# Patient Record
Sex: Male | Born: 1940 | Race: White | Hispanic: No | Smoking: Former smoker
Health system: Southern US, Community
[De-identification: ages and names within clinical notes are randomized; demographics above are authoritative.]

## PROBLEM LIST (undated history)

## (undated) DIAGNOSIS — N183 Chronic kidney disease, stage 3 unspecified: Secondary | ICD-10-CM

## (undated) DIAGNOSIS — I219 Acute myocardial infarction, unspecified: Secondary | ICD-10-CM

## (undated) DIAGNOSIS — Z9989 Dependence on other enabling machines and devices: Secondary | ICD-10-CM

## (undated) DIAGNOSIS — G4733 Obstructive sleep apnea (adult) (pediatric): Secondary | ICD-10-CM

## (undated) DIAGNOSIS — Z9981 Dependence on supplemental oxygen: Secondary | ICD-10-CM

## (undated) DIAGNOSIS — K219 Gastro-esophageal reflux disease without esophagitis: Secondary | ICD-10-CM

## (undated) DIAGNOSIS — Z86718 Personal history of other venous thrombosis and embolism: Secondary | ICD-10-CM

## (undated) DIAGNOSIS — I5032 Chronic diastolic (congestive) heart failure: Secondary | ICD-10-CM

## (undated) DIAGNOSIS — J45909 Unspecified asthma, uncomplicated: Secondary | ICD-10-CM

## (undated) DIAGNOSIS — I2510 Atherosclerotic heart disease of native coronary artery without angina pectoris: Secondary | ICD-10-CM

## (undated) DIAGNOSIS — Z87442 Personal history of urinary calculi: Secondary | ICD-10-CM

## (undated) DIAGNOSIS — F329 Major depressive disorder, single episode, unspecified: Secondary | ICD-10-CM

## (undated) DIAGNOSIS — M109 Gout, unspecified: Secondary | ICD-10-CM

## (undated) DIAGNOSIS — E7800 Pure hypercholesterolemia, unspecified: Secondary | ICD-10-CM

## (undated) DIAGNOSIS — M1990 Unspecified osteoarthritis, unspecified site: Secondary | ICD-10-CM

## (undated) DIAGNOSIS — E119 Type 2 diabetes mellitus without complications: Secondary | ICD-10-CM

## (undated) DIAGNOSIS — F419 Anxiety disorder, unspecified: Secondary | ICD-10-CM

## (undated) DIAGNOSIS — I10 Essential (primary) hypertension: Secondary | ICD-10-CM

## (undated) DIAGNOSIS — I82409 Acute embolism and thrombosis of unspecified deep veins of unspecified lower extremity: Secondary | ICD-10-CM

## (undated) DIAGNOSIS — F32A Depression, unspecified: Secondary | ICD-10-CM

## (undated) HISTORY — DX: Chronic kidney disease, stage 3 unspecified: N18.30

## (undated) HISTORY — DX: Chronic kidney disease, stage 3 (moderate): N18.3

## (undated) HISTORY — DX: Chronic diastolic (congestive) heart failure: I50.32

## (undated) HISTORY — PX: JOINT REPLACEMENT: SHX530

## (undated) HISTORY — PX: CORONARY ANGIOPLASTY WITH STENT PLACEMENT: SHX49

## (undated) HISTORY — PX: ESOPHAGOGASTRODUODENOSCOPY (EGD) WITH ESOPHAGEAL DILATION: SHX5812

## (undated) HISTORY — PX: CYSTOSCOPY: SUR368

## (undated) HISTORY — DX: Personal history of other venous thrombosis and embolism: Z86.718

---

## 2003-08-02 ENCOUNTER — Inpatient Hospital Stay (HOSPITAL_COMMUNITY): Admission: EM | Admit: 2003-08-02 | Discharge: 2003-08-06 | Payer: Self-pay

## 2010-04-03 HISTORY — PX: CYSTOSCOPY W/ STONE MANIPULATION: SHX1427

## 2015-04-04 DIAGNOSIS — I82409 Acute embolism and thrombosis of unspecified deep veins of unspecified lower extremity: Secondary | ICD-10-CM

## 2015-04-04 HISTORY — PX: TOTAL KNEE ARTHROPLASTY: SHX125

## 2015-04-04 HISTORY — DX: Acute embolism and thrombosis of unspecified deep veins of unspecified lower extremity: I82.409

## 2015-10-23 ENCOUNTER — Emergency Department (INDEPENDENT_AMBULATORY_CARE_PROVIDER_SITE_OTHER)
Admission: EM | Admit: 2015-10-23 | Discharge: 2015-10-23 | Disposition: A | Discharge status: Home / Self Care | Payer: Medicare Other | Source: Home / Self Care | Attending: Family Medicine | Admitting: Family Medicine

## 2015-10-23 ENCOUNTER — Encounter: Payer: Self-pay | Admitting: Emergency Medicine

## 2015-10-23 DIAGNOSIS — L03113 Cellulitis of right upper limb: Secondary | ICD-10-CM | POA: Diagnosis not present

## 2015-10-23 DIAGNOSIS — Z23 Encounter for immunization: Secondary | ICD-10-CM | POA: Diagnosis not present

## 2015-10-23 DIAGNOSIS — L03114 Cellulitis of left upper limb: Secondary | ICD-10-CM | POA: Diagnosis not present

## 2015-10-23 DIAGNOSIS — W57XXXA Bitten or stung by nonvenomous insect and other nonvenomous arthropods, initial encounter: Secondary | ICD-10-CM

## 2015-10-23 DIAGNOSIS — T148 Other injury of unspecified body region: Secondary | ICD-10-CM

## 2015-10-23 HISTORY — DX: Pure hypercholesterolemia, unspecified: E78.00

## 2015-10-23 HISTORY — DX: Essential (primary) hypertension: I10

## 2015-10-23 HISTORY — DX: Unspecified asthma, uncomplicated: J45.909

## 2015-10-23 MED ORDER — TETANUS-DIPHTH-ACELL PERTUSSIS 5-2.5-18.5 LF-MCG/0.5 IM SUSP
0.5000 mL | Freq: Once | INTRAMUSCULAR | Status: AC
  Administered 2015-10-23: 0.5 mL via INTRAMUSCULAR

## 2015-10-23 MED ORDER — DOXYCYCLINE HYCLATE 100 MG PO CAPS: 100.0000 mg | ORAL_CAPSULE | Freq: Two times a day (BID) | ORAL | Status: DC

## 2015-10-23 NOTE — Discharge Instructions (Signed)
Elevate hand. If symptoms become significantly worse during the night or over the weekend, proceed to the local emergency room.    Cellulitis Cellulitis is an infection of the skin and the tissue beneath it. The infected area is usually red and tender. Cellulitis occurs most often in the arms and lower legs.  CAUSES  Cellulitis is caused by bacteria that enter the skin through cracks or cuts in the skin. The most common types of bacteria that cause cellulitis are staphylococci and streptococci. SIGNS AND SYMPTOMS   Redness and warmth.  Swelling.  Tenderness or pain.  Fever. DIAGNOSIS  Your health care provider can usually determine what is wrong based on a physical exam. Blood tests may also be done. TREATMENT  Treatment usually involves taking an antibiotic medicine. HOME CARE INSTRUCTIONS   Take your antibiotic medicine as directed by your health care provider. Finish the antibiotic even if you start to feel better.  Keep the infected arm or leg elevated to reduce swelling.  Apply a warm cloth to the affected area up to 4 times per day to relieve pain.  Take medicines only as directed by your health care provider.  Keep all follow-up visits as directed by your health care provider. SEEK MEDICAL CARE IF:   You notice red streaks coming from the infected area.  Your red area gets larger or turns dark in color.  Your bone or joint underneath the infected area becomes painful after the skin has healed.  Your infection returns in the same area or another area.  You notice a swollen bump in the infected area.  You develop new symptoms.  You have a fever. SEEK IMMEDIATE MEDICAL CARE IF:   You feel very sleepy.  You develop vomiting or diarrhea.  You have a general ill feeling (malaise) with muscle aches and pains.   This information is not intended to replace advice given to you by your health care provider. Make sure you discuss any questions you have with your  health care provider.   Document Released: 12/28/2004 Document Revised: 12/09/2014 Document Reviewed: 06/05/2011 Elsevier Interactive Patient Education [employer]

## 2015-10-23 NOTE — ED Notes (Signed)
Pt c/o right hand swelling since yesterday.  Pt was pulling weeds and think he may have been bitten by some ants.  Some discomfort but no pain.

## 2015-10-26 ENCOUNTER — Telehealth: Payer: Self-pay | Admitting: *Deleted

## 2015-10-26 LAB — WOUND CULTURE
GRAM STAIN: NONE SEEN
GRAM STAIN: NONE SEEN
Gram Stain: NONE SEEN
ORGANISM ID, BACTERIA: NO GROWTH

## 2015-10-26 NOTE — Telephone Encounter (Signed)
Pt called requesting Wcx results. Results given. Pt reports that is swelling is resolving. Advised him to f/u with his PCP in [place] if he has any further concerns or fails to improve. Clemens Catholic, LPN

## 2015-11-23 NOTE — ED Provider Notes (Addendum)
[facility]    CSN: [id number] Arrival date & time: 10/23/15  1028  First Provider Contact:  First MD Initiated Contact with Patient 10/23/15 1131        History   Chief Complaint Chief Complaint  Patient presents with  . Hand Problem    HPI Joshua Dalton is a 75 y.o. male.   Patient was pulling weeds yesterday, and he later developed swelling and mild pain in his hands.  He believes that he may have been bitten by some ants.  No fevers, chills, and sweats.  He has had some slight weeping from the dorsa of his hands.   The history is provided by the patient.  Hand Pain  This is a new problem. The current episode started yesterday. The problem occurs constantly. The problem has been gradually worsening. Exacerbated by: movement of hand. Nothing relieves the symptoms. He has tried nothing for the symptoms.    Past Medical History:  Diagnosis Date  . Asthma   . Diabetes mellitus without complication (HCC)   . Heart disease   . Hypercholesteremia   . Hypertension   . Obstructive sleep apnea     There are no active problems to display for this patient.   Past Surgical History:  Procedure Laterality Date  . CARDIAC SURGERY         Home Medications    Prior to Admission medications   Medication Sig Start Date End Date Taking? Authorizing Provider  allopurinol (ZYLOPRIM) 300 MG tablet Take 300 mg by mouth daily.   Yes Historical Provider, MD  clopidogrel (PLAVIX) 75 MG tablet Take 75 mg by mouth daily.   Yes Historical Provider, MD  dabigatran (PRADAXA) 150 MG CAPS capsule Take 150 mg by mouth 2 (two) times daily.   Yes Historical Provider, MD  fenofibrate (TRICOR) 145 MG tablet Take 145 mg by mouth daily.   Yes Historical Provider, MD  fexofenadine (ALLEGRA) 180 MG tablet Take 180 mg by mouth daily.   Yes Historical Provider, MD  finasteride (PROSCAR) 5 MG tablet Take 5 mg by mouth daily.   Yes Historical Provider, MD  FLUoxetine (PROZAC) 20  MG capsule Take 20 mg by mouth daily.   Yes Historical Provider, MD  Insulin Aspart (NOVOLOG [place]) Inject into the skin.   Yes Historical Provider, MD  Insulin Glargine (LANTUS [place]) Inject into the skin.   Yes Historical Provider, MD  nitroGLYCERIN (NITROSTAT) 0.4 MG SL tablet Place 0.4 mg under the tongue every 5 (five) minutes as needed for chest pain.   Yes Historical Provider, MD  pravastatin (PRAVACHOL) 80 MG tablet Take 80 mg by mouth daily.   Yes Historical Provider, MD  tamsulosin (FLOMAX) 0.4 MG CAPS capsule Take 0.4 mg by mouth.   Yes Historical Provider, MD  doxycycline (VIBRAMYCIN) 100 MG capsule Take 1 capsule (100 mg total) by mouth 2 (two) times daily. Take with food. 10/23/15   Joshua Haw, MD    Family History No family history on file.  Social History Social History  Substance Use Topics  . Smoking status: Never Smoker  . Smokeless tobacco: Not on file  . Alcohol use No     Allergies   Review of patient's allergies indicates no known allergies.   Review of Systems Review of Systems  All other systems reviewed and are negative.    Physical Exam Triage Vital Signs ED Triage Vitals  Enc Vitals Group     BP 10/23/15 1104 126/74  Pulse Rate 10/23/15 1104 71     Resp --      Temp 10/23/15 1104 97.7 F (36.5 C)     Temp Source 10/23/15 1104 Oral     SpO2 10/23/15 1104 95 %     Weight 10/23/15 1104 255 lb 12 oz (116 kg)     Height 10/23/15 1104 5\' 6"  (1.676 m)     Head Circumference --      Peak Flow --      Pain Score 10/23/15 1222 0     Pain Loc --      Pain Edu? --      Excl. in GC? --    No data found.   Updated Vital Signs BP 126/74 (BP Location: Left Arm)   Pulse 71   Temp 97.7 F (36.5 C) (Oral)   Ht 5\' 6"  (1.676 m)   Wt 255 lb 12 oz (116 kg)   SpO2 95%   BMI 41.28 kg/m   Visual Acuity Right Eye Distance:   Left Eye Distance:   Bilateral Distance:    Right Eye Near:   Left Eye Near:    Bilateral Near:     Physical Exam    Constitutional: He appears well-developed and well-nourished. No distress.  HENT:  Head: Atraumatic.  Eyes: Pupils are equal, round, and reactive to light.  Cardiovascular: Normal rate.   Pulmonary/Chest: Effort normal.  Musculoskeletal:       Right hand: He exhibits decreased range of motion, tenderness and swelling. He exhibits no bony tenderness, normal two-point discrimination and normal capillary refill.       Left hand: He exhibits decreased range of motion, tenderness and swelling. He exhibits no bony tenderness, normal two-point discrimination and normal capillary refill.       Hands: Dorsa of both hands are slightly warm, swollen, and erythematous.  No tenderness to palpation.  Distal neurovascular function is intact.   Lymphadenopathy:    He has no cervical adenopathy.  Skin: Skin is warm and dry. No rash noted.  Nursing note and vitals reviewed.    UC Treatments / Results  Labs (all labs ordered are listed, but only abnormal results are displayed) Labs Reviewed  WOUND CULTURE   Narrative:    Performed at:  [facility]                [address], [address]                [place], [place] [address]    EKG  EKG Interpretation None       Radiology No results found.  Procedures Procedures (including critical care time)  Medications Ordered in UC Medications  Tdap (BOOSTRIX) injection 0.5 mL (0.5 mLs Intramuscular Given 10/23/15 1203)     Initial Impression / Assessment and Plan / UC Course  I have reviewed the triage vital signs and the nursing notes.  Pertinent labs & imaging results that were available during my care of the patient were reviewed by me and considered in my medical decision making (see chart for details).  Clinical Course  Wound culture pending Tdap administered Begin doxycycline for staph coverage. Followup with Family Doctor if not improved in one week.   Final Clinical Impressions(s) / UC Diagnoses   Final diagnoses:   Insect bite  Cellulitis of hand, right  Cellulitis of hand, left    New Prescriptions Discharge Medication List as of 11/01/2015 10:01 AM    START taking these medications  Details  doxycycline (VIBRAMYCIN) 100 MG capsule Take 1 capsule (100 mg total) by mouth 2 (two) times daily. Take with food., Starting 10/23/2015, Until Discontinued, Print         Joshua HawStephen A Vash Quezada, MD 11/23/15 2028    Joshua HawStephen A Duong Haydel, MD 11/23/15 2030

## 2016-08-20 ENCOUNTER — Inpatient Hospital Stay (HOSPITAL_COMMUNITY)
Admission: EM | Admit: 2016-08-20 | Discharge: 2016-08-26 | DRG: 291 | Disposition: A | Discharge status: Skilled Nursing Facility | Payer: Medicare Other | Attending: Internal Medicine | Admitting: Internal Medicine

## 2016-08-20 ENCOUNTER — Emergency Department (HOSPITAL_COMMUNITY): Payer: Medicare Other

## 2016-08-20 ENCOUNTER — Encounter: Payer: Self-pay | Admitting: Emergency Medicine

## 2016-08-20 ENCOUNTER — Emergency Department (INDEPENDENT_AMBULATORY_CARE_PROVIDER_SITE_OTHER)
Admission: EM | Admit: 2016-08-20 | Discharge: 2016-08-20 | Disposition: A | Discharge status: Other Acute Inpatient Hospital | Payer: Medicare Other | Source: Home / Self Care | Attending: Family Medicine | Admitting: Family Medicine

## 2016-08-20 ENCOUNTER — Encounter (HOSPITAL_COMMUNITY): Payer: Self-pay

## 2016-08-20 DIAGNOSIS — R0609 Other forms of dyspnea: Secondary | ICD-10-CM

## 2016-08-20 DIAGNOSIS — Z794 Long term (current) use of insulin: Secondary | ICD-10-CM | POA: Diagnosis not present

## 2016-08-20 DIAGNOSIS — Z79899 Other long term (current) drug therapy: Secondary | ICD-10-CM

## 2016-08-20 DIAGNOSIS — R0600 Dyspnea, unspecified: Secondary | ICD-10-CM | POA: Diagnosis present

## 2016-08-20 DIAGNOSIS — E669 Obesity, unspecified: Secondary | ICD-10-CM | POA: Diagnosis present

## 2016-08-20 DIAGNOSIS — R0601 Orthopnea: Secondary | ICD-10-CM

## 2016-08-20 DIAGNOSIS — K2970 Gastritis, unspecified, without bleeding: Secondary | ICD-10-CM | POA: Diagnosis present

## 2016-08-20 DIAGNOSIS — Z87891 Personal history of nicotine dependence: Secondary | ICD-10-CM

## 2016-08-20 DIAGNOSIS — R0602 Shortness of breath: Secondary | ICD-10-CM | POA: Diagnosis not present

## 2016-08-20 DIAGNOSIS — E119 Type 2 diabetes mellitus without complications: Secondary | ICD-10-CM

## 2016-08-20 DIAGNOSIS — E7800 Pure hypercholesterolemia, unspecified: Secondary | ICD-10-CM | POA: Diagnosis present

## 2016-08-20 DIAGNOSIS — G4733 Obstructive sleep apnea (adult) (pediatric): Secondary | ICD-10-CM | POA: Diagnosis present

## 2016-08-20 DIAGNOSIS — Z7901 Long term (current) use of anticoagulants: Secondary | ICD-10-CM

## 2016-08-20 DIAGNOSIS — Z09 Encounter for follow-up examination after completed treatment for conditions other than malignant neoplasm: Secondary | ICD-10-CM

## 2016-08-20 DIAGNOSIS — E1122 Type 2 diabetes mellitus with diabetic chronic kidney disease: Secondary | ICD-10-CM | POA: Diagnosis present

## 2016-08-20 DIAGNOSIS — N183 Chronic kidney disease, stage 3 unspecified: Secondary | ICD-10-CM | POA: Diagnosis present

## 2016-08-20 DIAGNOSIS — K224 Dyskinesia of esophagus: Secondary | ICD-10-CM | POA: Diagnosis present

## 2016-08-20 DIAGNOSIS — I442 Atrioventricular block, complete: Secondary | ICD-10-CM | POA: Diagnosis present

## 2016-08-20 DIAGNOSIS — I10 Essential (primary) hypertension: Secondary | ICD-10-CM | POA: Diagnosis not present

## 2016-08-20 DIAGNOSIS — Z86718 Personal history of other venous thrombosis and embolism: Secondary | ICD-10-CM

## 2016-08-20 DIAGNOSIS — J45909 Unspecified asthma, uncomplicated: Secondary | ICD-10-CM | POA: Diagnosis present

## 2016-08-20 DIAGNOSIS — Z6841 Body Mass Index (BMI) 40.0 and over, adult: Secondary | ICD-10-CM

## 2016-08-20 DIAGNOSIS — N189 Chronic kidney disease, unspecified: Secondary | ICD-10-CM

## 2016-08-20 DIAGNOSIS — R1310 Dysphagia, unspecified: Secondary | ICD-10-CM | POA: Diagnosis present

## 2016-08-20 DIAGNOSIS — N179 Acute kidney failure, unspecified: Secondary | ICD-10-CM | POA: Diagnosis present

## 2016-08-20 DIAGNOSIS — I4510 Unspecified right bundle-branch block: Secondary | ICD-10-CM | POA: Diagnosis present

## 2016-08-20 DIAGNOSIS — I5033 Acute on chronic diastolic (congestive) heart failure: Secondary | ICD-10-CM | POA: Diagnosis present

## 2016-08-20 DIAGNOSIS — I130 Hypertensive heart and chronic kidney disease with heart failure and stage 1 through stage 4 chronic kidney disease, or unspecified chronic kidney disease: Secondary | ICD-10-CM | POA: Diagnosis not present

## 2016-08-20 DIAGNOSIS — M79671 Pain in right foot: Secondary | ICD-10-CM

## 2016-08-20 LAB — BASIC METABOLIC PANEL
Anion gap: 11 (ref 5–15)
BUN: 36 mg/dL — AB (ref 4–21)
BUN: 36 mg/dL — AB (ref 6–20)
CHLORIDE: 103 mmol/L (ref 101–111)
CO2: 21 mmol/L — AB (ref 22–32)
Calcium: 10 mg/dL (ref 8.9–10.3)
Creatinine, Ser: 2.31 mg/dL — ABNORMAL HIGH (ref 0.61–1.24)
Creatinine: 2.3 mg/dL — AB (ref 0.6–1.3)
GFR calc Af Amer: 30 mL/min — ABNORMAL LOW (ref >60)
GFR calc non Af Amer: 26 mL/min — ABNORMAL LOW (ref >60)
GLUCOSE: 298 mg/dL — AB (ref 65–99)
Glucose: 298 mg/dL
POTASSIUM: 4.4 mmol/L (ref 3.5–5.1)
Potassium: 4.4 mmol/L (ref 3.4–5.3)
SODIUM: 135 mmol/L — AB (ref 137–147)
Sodium: 135 mmol/L (ref 135–145)

## 2016-08-20 LAB — CBC AND DIFFERENTIAL
HEMATOCRIT: 46 % (ref 41–53)
Hemoglobin: 15.7 g/dL (ref 13.5–17.5)
Platelets: 245 10*3/uL (ref 150–399)
WBC: 11.6 10^3/mL

## 2016-08-20 LAB — I-STAT TROPONIN, ED: Troponin i, poc: 0.01 ng/mL (ref 0.00–0.08)

## 2016-08-20 LAB — CBC
HEMATOCRIT: 46.4 % (ref 39.0–52.0)
Hemoglobin: 15.7 g/dL (ref 13.0–17.0)
MCH: 32.7 pg (ref 26.0–34.0)
MCHC: 33.8 g/dL (ref 30.0–36.0)
MCV: 96.7 fL (ref 78.0–100.0)
Platelets: 245 10*3/uL (ref 150–400)
RBC: 4.8 MIL/uL (ref 4.22–5.81)
RDW: 13.4 % (ref 11.5–15.5)
WBC: 11.6 10*3/uL — AB (ref 4.0–10.5)

## 2016-08-20 LAB — BRAIN NATRIURETIC PEPTIDE: B NATRIURETIC PEPTIDE 5: 24.4 pg/mL (ref 0.0–100.0)

## 2016-08-20 MED ORDER — LORATADINE 10 MG PO TABS
10.0000 mg | ORAL_TABLET | Freq: Every day | ORAL | Status: DC
  Administered 2016-08-21 – 2016-08-26 (×6): 10 mg via ORAL
  Filled 2016-08-20 (×6): qty 1

## 2016-08-20 MED ORDER — HEPARIN (PORCINE) IN NACL 100-0.45 UNIT/ML-% IJ SOLN
1300.0000 [IU]/h | INTRAMUSCULAR | Status: DC
  Administered 2016-08-21 (×2): 1500 [IU]/h via INTRAVENOUS
  Filled 2016-08-20 (×2): qty 250

## 2016-08-20 MED ORDER — ALLOPURINOL 300 MG PO TABS
300.0000 mg | ORAL_TABLET | Freq: Every day | ORAL | Status: DC
  Administered 2016-08-21 – 2016-08-26 (×6): 300 mg via ORAL
  Filled 2016-08-20 (×6): qty 1

## 2016-08-20 MED ORDER — SODIUM CHLORIDE 0.9% FLUSH
3.0000 mL | Freq: Two times a day (BID) | INTRAVENOUS | Status: DC
  Administered 2016-08-21 – 2016-08-25 (×10): 3 mL via INTRAVENOUS

## 2016-08-20 MED ORDER — INSULIN ASPART 100 UNIT/ML ~~LOC~~ SOLN
0.0000 [IU] | SUBCUTANEOUS | Status: DC
  Administered 2016-08-21: 2 [IU] via SUBCUTANEOUS
  Administered 2016-08-21: 3 [IU] via SUBCUTANEOUS
  Administered 2016-08-21: 5 [IU] via SUBCUTANEOUS
  Administered 2016-08-21: 15 [IU] via SUBCUTANEOUS
  Administered 2016-08-21 – 2016-08-22 (×2): 5 [IU] via SUBCUTANEOUS
  Administered 2016-08-22: 8 [IU] via SUBCUTANEOUS
  Administered 2016-08-23 (×2): 3 [IU] via SUBCUTANEOUS
  Administered 2016-08-23: 5 [IU] via SUBCUTANEOUS
  Administered 2016-08-23: 3 [IU] via SUBCUTANEOUS

## 2016-08-20 MED ORDER — FENOFIBRATE 160 MG PO TABS
160.0000 mg | ORAL_TABLET | Freq: Every day | ORAL | Status: DC
  Administered 2016-08-21 – 2016-08-26 (×6): 160 mg via ORAL
  Filled 2016-08-20 (×6): qty 1

## 2016-08-20 MED ORDER — FLUOXETINE HCL 20 MG PO CAPS
20.0000 mg | ORAL_CAPSULE | Freq: Every day | ORAL | Status: DC
  Administered 2016-08-21 – 2016-08-26 (×6): 20 mg via ORAL
  Filled 2016-08-20 (×6): qty 1

## 2016-08-20 MED ORDER — HEPARIN BOLUS VIA INFUSION
2500.0000 [IU] | Freq: Once | INTRAVENOUS | Status: AC
  Administered 2016-08-21: 2500 [IU] via INTRAVENOUS
  Filled 2016-08-20: qty 2500

## 2016-08-20 MED ORDER — OXYBUTYNIN CHLORIDE ER 10 MG PO TB24
10.0000 mg | ORAL_TABLET | Freq: Every day | ORAL | Status: DC
  Administered 2016-08-21 – 2016-08-25 (×6): 10 mg via ORAL
  Filled 2016-08-20 (×6): qty 1

## 2016-08-20 MED ORDER — PRAVASTATIN SODIUM 40 MG PO TABS
80.0000 mg | ORAL_TABLET | Freq: Every day | ORAL | Status: DC
  Administered 2016-08-21 – 2016-08-25 (×5): 80 mg via ORAL
  Filled 2016-08-20 (×5): qty 2

## 2016-08-20 MED ORDER — TAMSULOSIN HCL 0.4 MG PO CAPS
0.4000 mg | ORAL_CAPSULE | Freq: Every day | ORAL | Status: DC
  Administered 2016-08-21 – 2016-08-26 (×6): 0.4 mg via ORAL
  Filled 2016-08-20 (×6): qty 1

## 2016-08-20 MED ORDER — INSULIN GLARGINE 100 UNIT/ML ~~LOC~~ SOLN
40.0000 [IU] | Freq: Every day | SUBCUTANEOUS | Status: DC
  Administered 2016-08-21 – 2016-08-25 (×6): 40 [IU] via SUBCUTANEOUS
  Filled 2016-08-20 (×7): qty 0.4

## 2016-08-20 MED ORDER — SODIUM CHLORIDE 0.9 % IV BOLUS (SEPSIS)
1000.0000 mL | Freq: Once | INTRAVENOUS | Status: AC
  Administered 2016-08-20: 1000 mL via INTRAVENOUS

## 2016-08-20 NOTE — ED Notes (Signed)
Extra lavender tube for BNP sent to main lab.

## 2016-08-20 NOTE — ED Notes (Addendum)
The pt gets short of breath with exertion  But his room air sats  Remain in the range of 96-99% \.  When he exerts himself such as using a urinal  His sats drop into the  90% range then slowly return to normal

## 2016-08-20 NOTE — ED Notes (Signed)
Iv and meds given

## 2016-08-20 NOTE — H&P (Signed)
History and Physical    Joshua Dalton ZOX:[id number] DOB: [birth year] DOA: 08/20/2016  PCP: System, Pcp Not In  Patient coming from: Home  I have personally briefly reviewed patient's old medical records in [facility]  Chief Complaint: Dyspnea  HPI: Joshua Dalton is a 76 y.o. male with medical history significant of DM2, HTN, OSA, CKD.  Patient presents to the ED with c/o SOB.  Patient was initially evaluated at outside hospital when symptoms onset 3 weeks ago and had a negative stress test performed. Patient states his symptoms have worsened over that time. 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 Patient had recent trip to [facility] 2 weeks ago in which he fell and broke his right ankle. Patient flew back to [facility] to be with his daughter as ankle is being taken care of. Patient is originally from [place].    ED Course: Creat 2.3, unable to get CTA.  EKG shows RBBB, no prior available for comparison.  O2 sats in upper 90s on room air, lung sounds clear but patient has tachypnea despite this.  Trop and BNP neg.   Review of Systems: As per HPI otherwise 10 point review of systems negative.   Past Medical History:  Diagnosis Date  . Asthma   . Diabetes mellitus without complication (HCC)   . Heart disease   . Hypercholesteremia   . Hypertension   . Obstructive sleep apnea     Past Surgical History:  Procedure Laterality Date  . CARDIAC SURGERY       reports that he has quit smoking. He has never used smokeless tobacco. He reports that he does not drink alcohol. His drug history is not on file.  No Known Allergies  History reviewed. No pertinent family history.  Prior to Admission medications   Medication Sig Start Date End Date Taking? Authorizing Provider  allopurinol (ZYLOPRIM) 300 MG tablet Take 300 mg by mouth daily.   Yes [provider]  dabigatran (PRADAXA) 150 MG CAPS capsule Take 150 mg by mouth 2 (two) times daily.    Yes [provider]  fenofibrate (TRICOR) 145 MG tablet Take 145 mg by mouth daily.   Yes [provider]  fexofenadine (ALLEGRA) 180 MG tablet Take 180 mg by mouth daily.   Yes [provider]  FLUoxetine (PROZAC) 20 MG capsule Take 20 mg by mouth daily.   Yes [provider]  furosemide (LASIX) 40 MG tablet Take 40 mg by mouth daily.   Yes [provider]  insulin aspart (NOVOLOG) 100 UNIT/ML injection Inject 20 Units into the skin 3 (three) times daily before meals.   Yes [provider]  insulin glargine (LANTUS) 100 UNIT/ML injection Inject 70 Units into the skin at bedtime.   Yes [provider]  lisinopril (PRINIVIL,ZESTRIL) 10 MG tablet Take 10 mg by mouth daily.   Yes [provider]  nitroGLYCERIN (NITROSTAT) 0.4 MG SL tablet Place 0.4 mg under the tongue every 5 (five) minutes as needed for chest pain.   Yes [provider]  oxybutynin (DITROPAN-XL) 10 MG 24 hr tablet Take 10 mg by mouth at bedtime.   Yes [provider]  pravastatin (PRAVACHOL) 80 MG tablet Take 80 mg by mouth daily.   Yes [provider]  tamsulosin (FLOMAX) 0.4 MG CAPS capsule Take 0.4 mg by mouth daily.    Yes [provider]    Physical Exam: Vitals:   08/20/16 2045 08/20/16 2115 08/20/16 2130  08/20/16 2145  BP: 104/63 (!) 100/53 112/60 (!) 104/48  Pulse: 62 68 69 62  Resp: 20 (!) 22 20 19   Temp:      TempSrc:      SpO2: 99% 98% 96% 97%  Weight:      Height:        Constitutional: NAD, calm, comfortable Eyes: PERRL, lids and conjunctivae normal ENMT: Mucous membranes are moist. Posterior pharynx clear of any exudate or lesions.Normal dentition.  Neck: normal, supple, no masses, no thyromegaly Respiratory: clear to auscultation bilaterally, no wheezing, no crackles. Normal respiratory effort. No accessory muscle use.  Cardiovascular: Regular rate and rhythm, no murmurs / rubs / gallops. No  extremity edema. 2+ pedal pulses. No carotid bruits.  Abdomen: no tenderness, no masses palpated. No hepatosplenomegaly. Bowel sounds positive.  Musculoskeletal: no clubbing / cyanosis. No joint deformity upper and lower extremities. Good ROM, no contractures. Normal muscle tone.  Skin: no rashes, lesions, ulcers. No induration Neurologic: CN 2-12 grossly intact. Sensation intact, DTR normal. Strength 5/5 in all 4.  Psychiatric: Normal judgment and insight. Alert and oriented x 3. Normal mood.    Labs on Admission: I have personally reviewed following labs and imaging studies  CBC:  Recent Labs Lab 08/20/16 1937  WBC 11.6*  HGB 15.7  HCT 46.4  MCV 96.7  PLT 245   Basic Metabolic Panel:  Recent Labs Lab 08/20/16 1937  NA 135  K 4.4  CL 103  CO2 21*  GLUCOSE 298*  BUN 36*  CREATININE 2.31*  CALCIUM 10.0   GFR: Estimated Creatinine Clearance: 31.7 mL/min (A) (by C-G formula based on SCr of 2.31 mg/dL (H)). Liver Function Tests: No results for input(s): AST, ALT, ALKPHOS, BILITOT, PROT, ALBUMIN in the last 168 hours. No results for input(s): LIPASE, AMYLASE in the last 168 hours. No results for input(s): AMMONIA in the last 168 hours. Coagulation Profile: No results for input(s): INR, PROTIME in the last 168 hours. Cardiac Enzymes: No results for input(s): CKTOTAL, CKMB, CKMBINDEX, TROPONINI in the last 168 hours. BNP (last 3 results) No results for input(s): PROBNP in the last 8760 hours. HbA1C: No results for input(s): HGBA1C in the last 72 hours. CBG: No results for input(s): GLUCAP in the last 168 hours. Lipid Profile: No results for input(s): CHOL, HDL, LDLCALC, TRIG, CHOLHDL, LDLDIRECT in the last 72 hours. Thyroid Function Tests: No results for input(s): TSH, T4TOTAL, FREET4, T3FREE, THYROIDAB in the last 72 hours. Anemia Panel: No results for input(s): VITAMINB12, FOLATE, FERRITIN, TIBC, IRON, RETICCTPCT in the last 72 hours. Urine analysis: No results  found for: COLORURINE, APPEARANCEUR, LABSPEC, PHURINE, GLUCOSEU, HGBUR, BILIRUBINUR, KETONESUR, PROTEINUR, UROBILINOGEN, NITRITE, LEUKOCYTESUR  Radiological Exams on Admission: Dg Chest 2 View  Result Date: 08/20/2016 CLINICAL DATA:  Shortness of breath EXAM: CHEST  2 VIEW COMPARISON:  Aug 03, 2003 FINDINGS: A rounded density projects over the lateral right lung base, not the when seen previously. No other nodules or masses. No focal infiltrates. No pneumothorax. The cardiomediastinal silhouette is unremarkable. IMPRESSION: Nodular density projected over the lateral right lung base may represent a nipple shadow or pulmonary nodule based on this study. Recommend a repeat film with nipple markers to confirm. No focal infiltrate. Electronically Signed   By: Gerome Sam III M.D   On: 08/20/2016 18:40    EKG: Independently reviewed.  Assessment/Plan Principal Problem:   Dyspnea Active Problems:   DM2 (diabetes mellitus, type 2) (HCC)   HTN (hypertension)   CKD (chronic kidney disease)  1. Dyspnea - 1. VQ scan ordered 2. RBBB noted, no prior available for comparision 3. 2d echo ordered 4. Empiric heparin gtt (will hold his pradaxa). 2. CKD - unknown baseline 1. Hold lasix and lisinopril 2. Repeat BMP in AM 3. DM2 - 1. Lantus 40 units QHS 2. SSI q4h while NPO for AM VQ scan 4. H/o DVT - patient noted to be taking long term pradaxa which we will hold and put on heparin gtt instead 5. HTN - holding nephrotoxic lisinopril and lasix, continue other BP meds  DVT prophylaxis: Heparin gtt Code Status: Full Family Communication: Daughter at bedside Disposition Plan: Home after admit Consults called: None Admission status: Place in [place]   GARDNER, Heywood IlesJARED M. DO [facility] Pager [phone]  If 7AM-7PM, please contact day team taking care of patient [email or web address] Password [email or web address]  08/20/2016, 11:17 PM

## 2016-08-20 NOTE — ED Notes (Signed)
The pt is being admitted  He was not told he was going to be admitted.  He was wiondering what he was waiting on

## 2016-08-20 NOTE — ED Notes (Signed)
Ed res has cancelled  The order for a liter of fluid.  approx 150 ml infused

## 2016-08-20 NOTE — ED Provider Notes (Signed)
I have personally seen and examined the patient. I have reviewed the documentation on PMH/FH/Soc Hx. I have discussed the plan of care with the resident and patient.  I have reviewed and agree with the resident's documentation. Please see associated encounter note.   EKG Interpretation  Date/Time:  Sunday Aug 20 2016 19:10:56 EDT Ventricular Rate:  60 PR Interval:    QRS Duration: 138 QT Interval:  433 QTC Calculation: 433 R Axis:   70 Text Interpretation:  Sinus rhythm Prolonged PR interval Right bundle branch block Nonspecific T abnormalities, lateral leads No STEMI Otherwise no significant change Confirmed by [facility] MD, Peggye Poon ([id number]) on 08/20/2016 8:06:41 PM         Ki Corbo, Amadeo GarnetPedro Eduardo, MD 08/20/16 2210

## 2016-08-20 NOTE — Progress Notes (Signed)
ANTICOAGULATION CONSULT NOTE - Initial Consult  Pharmacy Consult for Heparin Indication: Suspected PE (pt on Pradaxa PTA for afib)  No Known Allergies  Patient Measurements: Height: 5\' 6"  (167.6 cm) Weight: 250 lb (113.4 kg) IBW/kg (Calculated) : 63.8 Heparin Dosing Weight: 90 kg  Vital Signs: Temp: 98.5 F (36.9 C) (05/20 1758) Temp Source: Oral (05/20 1758) BP: 104/48 (05/20 2145) Pulse Rate: 62 (05/20 2145)  Labs:  Recent Labs  08/20/16 1937  HGB 15.7  HCT 46.4  PLT 245  CREATININE 2.31*    Estimated Creatinine Clearance: 31.7 mL/min (A) (by C-G formula based on SCr of 2.31 mg/dL (H)).   Medical History: Past Medical History:  Diagnosis Date  . Asthma   . Diabetes mellitus without complication (HCC)   . Heart disease   . Hypercholesteremia   . Hypertension   . Obstructive sleep apnea     Medications:  See electronic med rec  Assessment: 76 y.o. M presents with SOB. Pt on Pradaxa PTA for afib - last dose 5/20 1100. To hold Pradaxa and begin heparin gtt for possible PE. CBC stable. Noted CrCl ~30 ml/min.  Goal of Therapy:  Heparin level 0.3-0.7 units/ml Monitor platelets by anticoagulation protocol: Yes   Plan:  Heparin IV bolus 2500 units (will give 1/2 bolus due to Pradaxa) Heparin gtt at 1500 units/hr Will f/u heparin level in 8 hours Daily heparin level and CBC  Christoper Fabianaron Macarthur Lorusso, PharmD, BCPS Clinical pharmacist, pager [phone] 08/20/2016,11:28 PM

## 2016-08-20 NOTE — ED Notes (Signed)
One unsuccessful attempt to start iv 

## 2016-08-20 NOTE — ED Notes (Signed)
The pt is c/o being sob for 3 weeks.  No chest pain but he reports that he is uncomfortable when he breathes.  He is not on home 02  sats ok at present

## 2016-08-20 NOTE — ED Provider Notes (Signed)
CSN: [id number]     Arrival date & time 08/20/16  1637 History   First MD Initiated Contact with Patient 08/20/16 1656     Chief Complaint  Patient presents with  . Shortness of Breath   (Consider location/radiation/quality/duration/timing/severity/associated sxs/prior Treatment) HPI  Isabella BowensRaymond Walter Wegener is a 76 y.o. male presenting to UC with daughter with concern for worsening SOB over the last 2 weeks.  Pt notes [place] He states he had dye in his veins and a scan that was "good" as well as a normal stress test.  [place] then [place] [facility] [place] where he fell and fractured his Right ankle.  He was placed in a temporary splint at that time, then had a short leg hard cast applied last week with [facility].  Daughter notes his Right foot appears to be turning purple or black in color.  She also notes his lips are darker than usual.  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 Hx of CHF.  He does take Pradaxa and Plavix.    Past Medical History:  Diagnosis Date  . Asthma   . Diabetes mellitus without complication (HCC)   . Heart disease   . Hypercholesteremia   . Hypertension   . Obstructive sleep apnea    Past Surgical History:  Procedure Laterality Date  . CARDIAC SURGERY     No family history on file. Social History  Substance Use Topics  . Smoking status: Former Games developermoker  . Smokeless tobacco: Never Used  . Alcohol use No    Review of Systems  Constitutional: Negative for chills and fever.  HENT: Negative for congestion, ear pain, sore throat, trouble swallowing and voice change.   Respiratory: Positive for shortness of breath. Negative for cough.   Cardiovascular: Negative for chest pain and palpitations.  Gastrointestinal: Negative for abdominal pain, diarrhea, nausea and vomiting.  Musculoskeletal: Positive for arthralgias and myalgias. Negative for back pain.       Right foot pain  Skin: Negative for  rash.    Allergies  Patient has no known allergies.  Home Medications   Prior to Admission medications   Medication Sig Start Date End Date Taking? Authorizing Provider  allopurinol (ZYLOPRIM) 300 MG tablet Take 300 mg by mouth daily.    [provider]  clopidogrel (PLAVIX) 75 MG tablet Take 75 mg by mouth daily.    [provider]  dabigatran (PRADAXA) 150 MG CAPS capsule Take 150 mg by mouth 2 (two) times daily.    [provider]  doxycycline (VIBRAMYCIN) 100 MG capsule Take 1 capsule (100 mg total) by mouth 2 (two) times daily. Take with food. 10/23/15   Lattie HawBeese, Stephen A, MD  fenofibrate (TRICOR) 145 MG tablet Take 145 mg by mouth daily.    [provider]  fexofenadine (ALLEGRA) 180 MG tablet Take 180 mg by mouth daily.    [provider]  finasteride (PROSCAR) 5 MG tablet Take 5 mg by mouth daily.    [provider]  FLUoxetine (PROZAC) 20 MG capsule Take 20 mg by mouth daily.    [provider]  Insulin Aspart (NOVOLOG [place]) Inject into the skin.    [provider]  Insulin Glargine (LANTUS [place]) Inject into the skin.    [provider]  nitroGLYCERIN (NITROSTAT) 0.4 MG SL tablet Place 0.4 mg under the tongue every 5 (five) minutes as needed for chest  pain.    [provider]  pravastatin (PRAVACHOL) 80 MG tablet Take 80 mg by mouth daily.    [provider]  tamsulosin (FLOMAX) 0.4 MG CAPS capsule Take 0.4 mg by mouth.    [provider]   Meds Ordered and Administered this Visit  Medications - No data to display  BP 98/66 (BP Location: Right Arm)   Pulse 67   Ht 5\' 6"  (1.676 m)   Wt 250 lb (113.4 kg)   SpO2 98%   BMI 40.35 kg/m  No data found.   Physical Exam  Constitutional: He is oriented to person, place, and time. He appears well-developed and well-nourished. He appears distressed.  Pt sitting in wheel chair breathing heavily through out mouth but is calm and  cooperative during exam.  HENT:  Head: Normocephalic and atraumatic.  Lips appear cyanotic  Eyes: EOM are normal.  Neck: Normal range of motion. Neck supple.  Cardiovascular: Normal rate and regular rhythm.   Pulmonary/Chest: No stridor. He is in respiratory distress ( mild to moderate ). He has decreased breath sounds in the right lower field and the left lower field. He has no wheezes. He has no rhonchi.  Pt breathing loudly through his mouth. Appears to be winded between sentences. No tripoding. No accessory muscle use.  Musculoskeletal: Normal range of motion.  Right leg in short leg cast: full ROM Right knee and toes.  Lymphadenopathy:    He has no cervical adenopathy.  Neurological: He is alert and oriented to person, place, and time.  Skin: Skin is warm and dry. Capillary refill takes less than 2 seconds. He is not diaphoretic.  Right leg in short leg cast. Toes visible. Dried skin with darkened skin visible along opening of cast where toes exit. Tender.   Psychiatric: He has a normal mood and affect. His behavior is normal.  Nursing note and vitals reviewed.   Urgent Care Course     Procedures (including critical care time)  Labs Review Labs Reviewed - No data to display  Imaging Review No results found.   MDM   1. Shortness of breath   2. Orthopnea   3. Right foot pain    Two weeks of worsening SOB. O2 Sat is 98% on RA, however, pt appears to be SOB and has decreased lung sounds bilaterally.  Pt has cast on Right lower leg  Concern for PE and/or CHF exacerbation.    Strongly recommend pt go to [facility] Emergency Department for further evaluation. Pt accompanied by his daughter who will drive him POV.  Pt stable for discharge POV.    Junius Finner, PA-C 08/20/16 1719

## 2016-08-20 NOTE — ED Provider Notes (Signed)
[facility]-EMERGENCY DEPT Provider Note   CSN: [id number] Arrival date & time: 08/20/16  1746     History   Chief Complaint Chief Complaint  Patient presents with  . Shortness of Breath  . Sore Throat    HPI Kell Ferris is a 76 y.o. male.  Patient is a 76 year old male with history of hypertension, high cholesterol, sleep apnea, heart failure, asthma who presents the ED with shortness of breath over the last several weeks. Patient was initially evaluated at outside hospital when this occurred 3 weeks ago and had a negative stress test performed. Patient states his symptoms of an worse over that time. 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 Patient had recent trip to [facility] 2 weeks ago in which he fell and broke his right ankle. Patient flew back to [facility] to be with his daughter as ankle is being taken care of. Patient is originally from [place].    The history is provided by the patient.  Shortness of Breath  This is a new problem. The average episode lasts 3 weeks. The problem occurs intermittently.The problem has been gradually worsening. Associated symptoms include sore throat and leg swelling. Pertinent negatives include no fever, no headaches, no coryza, no rhinorrhea, no swollen glands, no ear pain, no neck pain, no cough, no sputum production, no hemoptysis, no wheezing, no orthopnea, no chest pain, no syncope, no vomiting, no abdominal pain, no rash, no leg pain and no claudication. It is unknown what precipitated the problem. The treatment provided no relief. Associated medical issues include asthma and heart failure.    Past Medical History:  Diagnosis Date  . Asthma   . Diabetes mellitus without complication (HCC)   . Heart disease   . Hypercholesteremia   . Hypertension   . Obstructive sleep apnea     Patient Active Problem List   Diagnosis Date Noted  . Dyspnea 08/20/2016  . DM2 (diabetes mellitus, type 2) (HCC) 08/20/2016  . HTN  (hypertension) 08/20/2016  . CKD (chronic kidney disease) 08/20/2016    Past Surgical History:  Procedure Laterality Date  . CARDIAC SURGERY         Home Medications    Prior to Admission medications   Medication Sig Start Date End Date Taking? Authorizing Provider  allopurinol (ZYLOPRIM) 300 MG tablet Take 300 mg by mouth daily.   Yes [provider]  dabigatran (PRADAXA) 150 MG CAPS capsule Take 150 mg by mouth 2 (two) times daily.   Yes [provider]  fenofibrate (TRICOR) 145 MG tablet Take 145 mg by mouth daily.   Yes [provider]  fexofenadine (ALLEGRA) 180 MG tablet Take 180 mg by mouth daily.   Yes [provider]  FLUoxetine (PROZAC) 20 MG capsule Take 20 mg by mouth daily.   Yes [provider]  furosemide (LASIX) 40 MG tablet Take 40 mg by mouth daily.   Yes [provider]  insulin aspart (NOVOLOG) 100 UNIT/ML injection Inject 20 Units into the skin 3 (three) times daily before meals.   Yes [provider]  insulin glargine (LANTUS) 100 UNIT/ML injection Inject 70 Units into the skin at bedtime.   Yes [provider]  lisinopril (PRINIVIL,ZESTRIL) 10 MG tablet Take 10 mg by mouth daily.   Yes [provider]  nitroGLYCERIN (NITROSTAT) 0.4 MG SL tablet Place 0.4 mg under the tongue every 5 (five) minutes as needed for chest pain.   Yes [provider]  oxybutynin (DITROPAN-XL)  10 MG 24 hr tablet Take 10 mg by mouth at bedtime.   Yes [provider]  pravastatin (PRAVACHOL) 80 MG tablet Take 80 mg by mouth daily.   Yes [provider]  tamsulosin (FLOMAX) 0.4 MG CAPS capsule Take 0.4 mg by mouth daily.    Yes [provider]    Family History History reviewed. No pertinent family history.  Social History Social History  Substance Use Topics  . Smoking status: Former Games developer  . Smokeless tobacco: Never Used  . Alcohol use No     Allergies     Patient has no known allergies.   Review of Systems Review of Systems  Constitutional: Negative for chills and fever.  HENT: Positive for sore throat and trouble swallowing. Negative for ear pain and rhinorrhea.   Eyes: Negative for pain and visual disturbance.  Respiratory: Positive for shortness of breath. Negative for cough, hemoptysis, sputum production and wheezing.   Cardiovascular: Positive for leg swelling. Negative for chest pain, palpitations, orthopnea, claudication and syncope.  Gastrointestinal: Negative for abdominal pain and vomiting.  Genitourinary: Negative for dysuria and hematuria.  Musculoskeletal: Negative for arthralgias, back pain and neck pain.  Skin: Negative for color change and rash.  Neurological: Negative for seizures, syncope and headaches.  All other systems reviewed and are negative.    Physical Exam Updated Vital Signs  ED Triage Vitals  Enc Vitals Group     BP 08/20/16 1758 116/66     Pulse Rate 08/20/16 1758 70     Resp 08/20/16 1758 16     Temp 08/20/16 1758 98.5 F (36.9 C)     Temp Source 08/20/16 1758 Oral     SpO2 08/20/16 1758 98 %     Weight 08/20/16 1758 250 lb (113.4 kg)     Height 08/20/16 1758 5\' 6"  (1.676 m)     Head Circumference --      Peak Flow --      Pain Score 08/20/16 1802 0     Pain Loc --      Pain Edu? --      Excl. in GC? --     Physical Exam  Constitutional: He appears well-developed and well-nourished.  HENT:  Head: Normocephalic and atraumatic.  Mouth/Throat: No oropharyngeal exudate.  Eyes: Conjunctivae and EOM are normal. Pupils are equal, round, and reactive to light.  Neck: Normal range of motion. Neck supple.  Cardiovascular: Normal rate, regular rhythm, normal heart sounds and intact distal pulses.   Pulmonary/Chest: No respiratory distress. He has no wheezes. He has rales.  Increased WOB  Abdominal: Soft. Bowel sounds are normal. He exhibits no distension. There is no tenderness.   Musculoskeletal: He exhibits edema (trace b/l lower legs).  Neurological: He is alert.  Skin: Skin is warm and dry. Capillary refill takes less than 2 seconds.  Psychiatric: He has a normal mood and affect.  Nursing note and vitals reviewed.    ED Treatments / Results  Labs (all labs ordered are listed, but only abnormal results are displayed) Labs Reviewed  BASIC METABOLIC PANEL - Abnormal; Notable for the following:       Result Value   CO2 21 (*)    Glucose, Bld 298 (*)    BUN 36 (*)    Creatinine, Ser 2.31 (*)    GFR calc non Af Amer 26 (*)    GFR calc Af Amer 30 (*)    All other components within normal limits  CBC - Abnormal;  Notable for the following:    WBC 11.6 (*)    All other components within normal limits  BRAIN NATRIURETIC PEPTIDE  BASIC METABOLIC PANEL  CBC  I-STAT TROPOININ, ED    EKG  EKG Interpretation  Date/Time:  Sunday Aug 20 2016 19:10:56 EDT Ventricular Rate:  60 PR Interval:    QRS Duration: 138 QT Interval:  433 QTC Calculation: 433 R Axis:   70 Text Interpretation:  Sinus rhythm Prolonged PR interval Right bundle branch block Nonspecific T abnormalities, lateral leads No STEMI Otherwise no significant change Confirmed by [facility] MD, PEDRO ([id number]) on 08/20/2016 8:06:41 PM       Radiology Dg Chest 2 View  Result Date: 08/20/2016 CLINICAL DATA:  Shortness of breath EXAM: CHEST  2 VIEW COMPARISON:  Aug 03, 2003 FINDINGS: A rounded density projects over the lateral right lung base, not the when seen previously. No other nodules or masses. No focal infiltrates. No pneumothorax. The cardiomediastinal silhouette is unremarkable. IMPRESSION: Nodular density projected over the lateral right lung base may represent a nipple shadow or pulmonary nodule based on this study. Recommend a repeat film with nipple markers to confirm. No focal infiltrate. Electronically Signed   By: Gerome Sam III M.D   On: 08/20/2016 18:40    Procedures Procedures  (including critical care time)  Medications Ordered in ED Medications  allopurinol (ZYLOPRIM) tablet 300 mg (not administered)  loratadine (CLARITIN) tablet 10 mg (not administered)  fenofibrate tablet 160 mg (not administered)  FLUoxetine (PROZAC) capsule 20 mg (not administered)  tamsulosin (FLOMAX) capsule 0.4 mg (not administered)  pravastatin (PRAVACHOL) tablet 80 mg (not administered)  oxybutynin (DITROPAN-XL) 24 hr tablet 10 mg (not administered)  insulin glargine (LANTUS) injection 40 Units (not administered)  insulin aspart (novoLOG) injection 0-15 Units (not administered)  sodium chloride flush (NS) 0.9 % injection 3 mL (not administered)  sodium chloride 0.9 % bolus 1,000 mL (0 mLs Intravenous Stopped 08/20/16 2044)     Initial Impression / Assessment and Plan / ED Course  I have reviewed the triage vital signs and the nursing notes.  Pertinent labs & imaging results that were available during my care of the patient were reviewed by me and considered in my medical decision making (see chart for details).     Dayton Kenley is a 76 year old male with history of DVT on Pradaxa, high cholesterol, hypertension, diabetes, obstructive sleep apnea who presents to the ED with shortness of breath and difficulty swallowing. Patient's vitals at time of arrival to the ED are unremarkable and patient is without fever. Patient with symptoms over the last 3 weeks. Patient had cardiac stress test 3 weeks ago with own doctor in [place] that was unremarkable. Patient traveled to [place] and he broke his right ankle there. Patient flew back to [facility] to be with daughter to recover and follow-up with orthopedics here and states that his shortness of breath has significantly worsened since being home. Patient has possible heart failure. Patient states that he is also has some difficulty swallowing solids but no difficulties swallowing fluids. Patient has no signs of lip or tongue  swelling on exam. Patient has clear breath sounds bilaterally and some edema in his lower extremities. Patient has a cast to his right lower extremity. Patient had EKG that shows normal sinus rhythm with no signs of ischemia with right bundle branch block. No prior to compare to. Low concern for ACS given recent stress test. Troponin within normal limits. Concern for possible PE given  recent fracture and immobilization and long flight and hx of DVT following prior fx. Possible volume overload. Will get CBC, BMP, BNP, chest x-ray.   Patient with elevated creatinine and no prior to compare to. Patient states that he does have history of kidney injury but unaware of what his numbers are. Unable to get PE study due to elevated creatinine. Cardiology was called and state that patient needs to be admitted for VQ scan. Unable to get at this time as not an emergent need given hemodynamically stable patient. Patient otherwise with unremarkable chest x-ray with no pneumonia, no pneumothorax, no pleural effusion. Patient with otherwise unremarkable CBC, BMP with no significant anemia or electrolyte abnormalities. Patient with within normal BNP and diastolic CHF exacerbation. Patient to be admitted to hospitalist service with acute kidney injury and need for PE rule out. Likely AKI from chronic issues but also possibly from poor PO intake. No obvious reason for dysphagia and patient has no voice change, normal ROM of neck and no issues with secretions. May need EGD as outpatient. Patient transferred to medicine service in stable condition.  Final Clinical Impressions(s) / ED Diagnoses   Final diagnoses:  SOB (shortness of breath)  Acute kidney injury (HCC)  Dysphagia, unspecified type    New Prescriptions New Prescriptions   No medications on file     Virgina NorfolkCuratolo, Mackenzee Becvar, DO 08/20/16 2337

## 2016-08-20 NOTE — ED Notes (Signed)
Pt gets short of breath with little movement. Just sitting up in the bed, standing, as well as rolling over in bed to change sheets, but Pt's O2 level has stayed between 96%-98%.

## 2016-08-20 NOTE — ED Triage Notes (Signed)
SOB x  2 weeks, been traveling via airplane and new right ankle fracture, hx of asthma, sleep apnea

## 2016-08-20 NOTE — ED Notes (Signed)
The pt was attempting to void and was unable to get the urinal in place in time  And wet the bed  Bed changed

## 2016-08-20 NOTE — ED Triage Notes (Signed)
Pt complaining of SOB and difficulty swallowing. Pt states ongointg x 1 month. Pt states scheduled for endoscopy to assess for strictures, unable to attend appointment due to recent leg fracture. Pt with airway intact at triage. Pt sats 100% on RA. Pt denies any chest pain.

## 2016-08-21 ENCOUNTER — Observation Stay (HOSPITAL_COMMUNITY): Payer: Medicare Other

## 2016-08-21 ENCOUNTER — Other Ambulatory Visit (HOSPITAL_COMMUNITY): Payer: Medicare Other

## 2016-08-21 ENCOUNTER — Encounter (HOSPITAL_COMMUNITY): Payer: Self-pay

## 2016-08-21 ENCOUNTER — Ambulatory Visit (HOSPITAL_BASED_OUTPATIENT_CLINIC_OR_DEPARTMENT_OTHER): Payer: Medicare Other

## 2016-08-21 DIAGNOSIS — N183 Chronic kidney disease, stage 3 (moderate): Secondary | ICD-10-CM | POA: Diagnosis present

## 2016-08-21 DIAGNOSIS — K224 Dyskinesia of esophagus: Secondary | ICD-10-CM | POA: Diagnosis present

## 2016-08-21 DIAGNOSIS — Z794 Long term (current) use of insulin: Secondary | ICD-10-CM | POA: Diagnosis not present

## 2016-08-21 DIAGNOSIS — R55 Syncope and collapse: Secondary | ICD-10-CM | POA: Diagnosis not present

## 2016-08-21 DIAGNOSIS — I5033 Acute on chronic diastolic (congestive) heart failure: Secondary | ICD-10-CM | POA: Diagnosis present

## 2016-08-21 DIAGNOSIS — I442 Atrioventricular block, complete: Secondary | ICD-10-CM | POA: Diagnosis present

## 2016-08-21 DIAGNOSIS — I441 Atrioventricular block, second degree: Secondary | ICD-10-CM | POA: Diagnosis not present

## 2016-08-21 DIAGNOSIS — Z87891 Personal history of nicotine dependence: Secondary | ICD-10-CM | POA: Diagnosis not present

## 2016-08-21 DIAGNOSIS — E7800 Pure hypercholesterolemia, unspecified: Secondary | ICD-10-CM | POA: Diagnosis present

## 2016-08-21 DIAGNOSIS — Z6841 Body Mass Index (BMI) 40.0 and over, adult: Secondary | ICD-10-CM | POA: Diagnosis not present

## 2016-08-21 DIAGNOSIS — N179 Acute kidney failure, unspecified: Secondary | ICD-10-CM | POA: Diagnosis present

## 2016-08-21 DIAGNOSIS — Z09 Encounter for follow-up examination after completed treatment for conditions other than malignant neoplasm: Secondary | ICD-10-CM | POA: Diagnosis not present

## 2016-08-21 DIAGNOSIS — Z86718 Personal history of other venous thrombosis and embolism: Secondary | ICD-10-CM | POA: Diagnosis not present

## 2016-08-21 DIAGNOSIS — E1122 Type 2 diabetes mellitus with diabetic chronic kidney disease: Secondary | ICD-10-CM | POA: Diagnosis present

## 2016-08-21 DIAGNOSIS — I130 Hypertensive heart and chronic kidney disease with heart failure and stage 1 through stage 4 chronic kidney disease, or unspecified chronic kidney disease: Secondary | ICD-10-CM | POA: Diagnosis present

## 2016-08-21 DIAGNOSIS — G4733 Obstructive sleep apnea (adult) (pediatric): Secondary | ICD-10-CM | POA: Diagnosis present

## 2016-08-21 DIAGNOSIS — R0600 Dyspnea, unspecified: Secondary | ICD-10-CM | POA: Diagnosis present

## 2016-08-21 DIAGNOSIS — Z7901 Long term (current) use of anticoagulants: Secondary | ICD-10-CM | POA: Diagnosis not present

## 2016-08-21 DIAGNOSIS — I4510 Unspecified right bundle-branch block: Secondary | ICD-10-CM | POA: Diagnosis present

## 2016-08-21 DIAGNOSIS — N189 Chronic kidney disease, unspecified: Secondary | ICD-10-CM | POA: Diagnosis not present

## 2016-08-21 DIAGNOSIS — R1310 Dysphagia, unspecified: Secondary | ICD-10-CM | POA: Diagnosis present

## 2016-08-21 DIAGNOSIS — R0602 Shortness of breath: Secondary | ICD-10-CM | POA: Diagnosis not present

## 2016-08-21 DIAGNOSIS — K2970 Gastritis, unspecified, without bleeding: Secondary | ICD-10-CM | POA: Diagnosis present

## 2016-08-21 DIAGNOSIS — Z79899 Other long term (current) drug therapy: Secondary | ICD-10-CM | POA: Diagnosis not present

## 2016-08-21 DIAGNOSIS — E669 Obesity, unspecified: Secondary | ICD-10-CM | POA: Diagnosis present

## 2016-08-21 DIAGNOSIS — I10 Essential (primary) hypertension: Secondary | ICD-10-CM | POA: Diagnosis not present

## 2016-08-21 DIAGNOSIS — J45909 Unspecified asthma, uncomplicated: Secondary | ICD-10-CM | POA: Diagnosis present

## 2016-08-21 LAB — CBC
HEMATOCRIT: 44.5 % (ref 39.0–52.0)
Hemoglobin: 15.1 g/dL (ref 13.0–17.0)
MCH: 33.2 pg (ref 26.0–34.0)
MCHC: 33.9 g/dL (ref 30.0–36.0)
MCV: 97.8 fL (ref 78.0–100.0)
Platelets: 235 10*3/uL (ref 150–400)
RBC: 4.55 MIL/uL (ref 4.22–5.81)
RDW: 13.9 % (ref 11.5–15.5)
WBC: 10.1 10*3/uL (ref 4.0–10.5)

## 2016-08-21 LAB — GLUCOSE, CAPILLARY
GLUCOSE-CAPILLARY: 204 mg/dL — AB (ref 65–99)
Glucose-Capillary: 374 mg/dL — ABNORMAL HIGH (ref 65–99)
Glucose-Capillary: 155 mg/dL — ABNORMAL HIGH (ref 65–99)
Glucose-Capillary: 81 mg/dL (ref 65–99)
Glucose-Capillary: 132 mg/dL — ABNORMAL HIGH (ref 65–99)
Glucose-Capillary: 224 mg/dL — ABNORMAL HIGH (ref 65–99)

## 2016-08-21 LAB — TSH
TSH: 3.016 u[IU]/mL (ref 0.350–4.500)
TSH: 3.02 u[IU]/mL (ref 0.41–5.90)

## 2016-08-21 LAB — BASIC METABOLIC PANEL
Anion gap: 8 (ref 5–15)
BUN: 37 mg/dL — AB (ref 4–21)
BUN: 37 mg/dL — AB (ref 6–20)
CALCIUM: 9.4 mg/dL (ref 8.9–10.3)
CO2: 25 mmol/L (ref 22–32)
CREATININE: 2.5 mg/dL — AB (ref 0.6–1.3)
Chloride: 105 mmol/L (ref 101–111)
Creatinine, Ser: 2.47 mg/dL — ABNORMAL HIGH (ref 0.61–1.24)
GFR calc Af Amer: 27 mL/min — ABNORMAL LOW (ref >60)
GFR calc non Af Amer: 24 mL/min — ABNORMAL LOW (ref >60)
GLUCOSE: 216 mg/dL
GLUCOSE: 216 mg/dL — AB (ref 65–99)
POTASSIUM: 3.6 mmol/L (ref 3.4–5.3)
Potassium: 3.6 mmol/L (ref 3.5–5.1)
SODIUM: 138 mmol/L (ref 135–145)
Sodium: 138 mmol/L (ref 137–147)

## 2016-08-21 LAB — ECHOCARDIOGRAM COMPLETE
Height: 66.5 in
WEIGHTICAEL: 4000 [oz_av]

## 2016-08-21 LAB — HEPARIN LEVEL (UNFRACTIONATED)
HEPARIN UNFRACTIONATED: 0.65 [IU]/mL (ref 0.30–0.70)
Heparin Unfractionated: 0.39 IU/mL (ref 0.30–0.70)

## 2016-08-21 LAB — CBC AND DIFFERENTIAL
HCT: 45 % (ref 41–53)
Hemoglobin: 15.1 g/dL (ref 13.5–17.5)
PLATELETS: 235 10*3/uL (ref 150–399)
WBC: 10.1 10^3/mL

## 2016-08-21 MED ORDER — TECHNETIUM TO 99M ALBUMIN AGGREGATED
4.4000 | Freq: Once | INTRAVENOUS | Status: AC | PRN
  Administered 2016-08-21: 4.4 via INTRAVENOUS

## 2016-08-21 MED ORDER — TECHNETIUM TC 99M DIETHYLENETRIAME-PENTAACETIC ACID
31.5000 | Freq: Once | INTRAVENOUS | Status: AC | PRN
  Administered 2016-08-21: 31.5 via RESPIRATORY_TRACT

## 2016-08-21 MED ORDER — PERFLUTREN LIPID MICROSPHERE
1.0000 mL | INTRAVENOUS | Status: AC | PRN
  Administered 2016-08-21: 2 mL via INTRAVENOUS
  Filled 2016-08-21: qty 10

## 2016-08-21 NOTE — Care Management Note (Signed)
Case Management Note  Patient Details  Name: Joshua Dalton MRN: [id number] Date of Birth: 1940-08-06  Subjective/Objective:       CM following for progression and d/c planning.              Action/Plan: 08/21/2016 Met with pt and daughter, OBS status explained. Both pt and daughter anxious for test to be completed and hopeful of a d/c as soon as appropriate.  Pt has DME due to recent fx ankle. Will follow for any further d/c needs.  Perhaps HHPT would be helpful given NWB status.   Expected Discharge Date:                  Expected Discharge Plan:  [place]  In-House Referral:  NA  Discharge planning Services  CM Consult  Post Acute Care Choice:    Choice offered to:  Patient, Adult Children  DME Arranged:    DME Agency:     HH Arranged:  PT, OT HH Agency:     Status of Service:  In process, will continue to follow  If discussed at Long Length of Stay Meetings, dates discussed:    Additional Comments:  Adron Bene, RN 08/21/2016, 4:15 PM

## 2016-08-21 NOTE — Progress Notes (Signed)
PROGRESS NOTE    Joshua Dalton  WUJ:[id number] DOB: 08-12-1940 DOA: 08/20/2016 PCP: System, Pcp Not In   Outpatient Specialists:     Brief Narrative:  Joshua Dalton is a 76 y.o. male with medical history significant of DM2, HTN, OSA, CKD.  Patient presents to the ED with c/o SOB.  Patient was initially evaluated at outside hospital when symptoms onset 3 weeks ago and had a negative stress test performed. Patient states his symptoms have worsened over that time. 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 Patient had recent trip to [facility] 2 weeks ago in which he fell and broke his right ankle. Patient flew back to [facility] to be with his daughter as ankle is being taken care of. Patient is originally from [place].   ED Course: Creat 2.3, unable to get CTA.  EKG shows RBBB, no prior available for comparison.  O2 sats in upper 90s on room air, lung sounds clear but patient has tachypnea despite this.  Trop and BNP neg.   Assessment & Plan:   Principal Problem:   Dyspnea Active Problems:   DM2 (diabetes mellitus, type 2) (HCC)   HTN (hypertension)   CKD (chronic kidney disease)   Dyspnea - per patient known h/o CHF but not sure what kind VQ scan ordered Has gained 30 lbs in last few months RBBB noted, no prior available for comparision 2d echo ordered Empiric heparin gtt (will hold his pradaxa). -request records from PCP Re: Cr and echo  CKD - unknown baseline Hold lasix and lisinopril Repeat BMP in AM  DM2 - Lantus 40 units QHS SSI q4h while NPO for AM VQ scan  H/o DVT - patient noted to be taking long term pradaxa which we will hold and put on heparin gtt instead  HTN - holding nephrotoxic lisinopril and lasix, continue other BP meds  ? pulm nodule -needs repeat x ray with nipple marker  obesity Body mass index is 39.75 kg/m.  DVT prophylaxis:  Fully anticoagulated   Code Status: Full Code   Family  Communication:   Disposition Plan:     Consultants:     Subjective: Poor historian -he is not sure of his medical history  Objective: Vitals:   08/21/16 0015 08/21/16 0030 08/21/16 0116 08/21/16 0250  BP: (!) 82/38 (!) 97/48 (!) 117/48   Pulse: 88 89 86 82  Resp: 19 (!) 21 20 18   Temp:   98.4 F (36.9 C)   TempSrc:   Oral   SpO2: 95% 93% 94% 94%  Weight:      Height:   5' 6.5" (1.689 m)     Intake/Output Summary (Last 24 hours) at 08/21/16 1157 Last data filed at 08/21/16 0911  Gross per 24 hour  Intake              442 ml  Output              300 ml  Net              142 ml   Filed Weights   08/20/16 1758  Weight: 113.4 kg (250 lb)    Examination:  General exam: Appears calm and comfortable - hard of hearing Respiratory system: Clear to auscultation. Respiratory effort normal. Cardiovascular system: S1 & S2 heard, RRR. No JVD, murmurs, rubs, gallops or clicks. Min LE edema. Gastrointestinal system: Abdomen is nondistended, soft and nontender. No organomegaly or masses felt. Normal bowel sounds heard. Central nervous system:  Alert and oriented. No focal neurological deficits. Extremities: Symmetric 5 x 5 power. Skin: No rashes, lesions or ulcers Psychiatry: Judgement and insight appear normal. Mood & affect appropriate.     Data Reviewed: I have personally reviewed following labs and imaging studies  CBC:  Recent Labs Lab 08/20/16 1937 08/21/16 0700  WBC 11.6* 10.1  HGB 15.7 15.1  HCT 46.4 44.5  MCV 96.7 97.8  PLT 245 235   Basic Metabolic Panel:  Recent Labs Lab 08/20/16 1937 08/21/16 0442  NA 135 138  K 4.4 3.6  CL 103 105  CO2 21* 25  GLUCOSE 298* 216*  BUN 36* 37*  CREATININE 2.31* 2.47*  CALCIUM 10.0 9.4   GFR: Estimated Creatinine Clearance: 29.9 mL/min (A) (by C-G formula based on SCr of 2.47 mg/dL (H)). Liver Function Tests: No results for input(s): AST, ALT, ALKPHOS, BILITOT, PROT, ALBUMIN in the last 168 hours. No  results for input(s): LIPASE, AMYLASE in the last 168 hours. No results for input(s): AMMONIA in the last 168 hours. Coagulation Profile: No results for input(s): INR, PROTIME in the last 168 hours. Cardiac Enzymes: No results for input(s): CKTOTAL, CKMB, CKMBINDEX, TROPONINI in the last 168 hours. BNP (last 3 results) No results for input(s): PROBNP in the last 8760 hours. HbA1C: No results for input(s): HGBA1C in the last 72 hours. CBG:  Recent Labs Lab 08/21/16 0104 08/21/16 0621 08/21/16 0807  GLUCAP 374* 155* 81   Lipid Profile: No results for input(s): CHOL, HDL, LDLCALC, TRIG, CHOLHDL, LDLDIRECT in the last 72 hours. Thyroid Function Tests:  Recent Labs  08/21/16 0832  TSH 3.016   Anemia Panel: No results for input(s): VITAMINB12, FOLATE, FERRITIN, TIBC, IRON, RETICCTPCT in the last 72 hours. Urine analysis: No results found for: COLORURINE, APPEARANCEUR, LABSPEC, PHURINE, GLUCOSEU, HGBUR, BILIRUBINUR, KETONESUR, PROTEINUR, UROBILINOGEN, NITRITE, LEUKOCYTESUR   )No results found for this or any previous visit (from the past 240 hour(s)).    Anti-infectives    None       Radiology Studies: Dg Chest 2 View  Result Date: 08/20/2016 CLINICAL DATA:  Shortness of breath EXAM: CHEST  2 VIEW COMPARISON:  Aug 03, 2003 FINDINGS: A rounded density projects over the lateral right lung base, not the when seen previously. No other nodules or masses. No focal infiltrates. No pneumothorax. The cardiomediastinal silhouette is unremarkable. IMPRESSION: Nodular density projected over the lateral right lung base may represent a nipple shadow or pulmonary nodule based on this study. Recommend a repeat film with nipple markers to confirm. No focal infiltrate. Electronically Signed   By: Gerome Samavid  Williams III M.D   On: 08/20/2016 18:40        Scheduled Meds: . allopurinol  300 mg Oral Daily  . fenofibrate  160 mg Oral Daily  . FLUoxetine  20 mg Oral Daily  . insulin aspart  0-15  Units Subcutaneous Q4H  . insulin glargine  40 Units Subcutaneous QHS  . loratadine  10 mg Oral Daily  . oxybutynin  10 mg Oral QHS  . pravastatin  80 mg Oral q1800  . sodium chloride flush  3 mL Intravenous Q12H  . tamsulosin  0.4 mg Oral Daily   Continuous Infusions: . heparin 1,500 Units/hr (08/21/16 0112)     LOS: 0 days    Time spent: 25 min    Gayland Nicol U Kateleen Encarnacion, DO [facility] Pager [phone]  If 7PM-7AM, please contact night-coverage [email or web address] Password [email or web address] 08/21/2016, 11:57 AM

## 2016-08-21 NOTE — Progress Notes (Addendum)
  Echocardiogram 2D Echocardiogram with definity has been performed. Technically difficult study due to patient body habitus.  Joshua Dalton L Androw 08/21/2016, 12:45 PM

## 2016-08-21 NOTE — Progress Notes (Signed)
Per order, medical records request form faxed to:  Dr. Thomasenia Salesobert Krause, [facility] [phone] Fax [phone] [address] [place], [place]  [id number]  Request was faxed in order for [facility], at fax [phone].

## 2016-08-21 NOTE — Progress Notes (Signed)
ANTICOAGULATION CONSULT NOTE - Initial Consult  Pharmacy Consult for Heparin Indication: Suspected PE (pt on Pradaxa PTA for afib)  No Known Allergies  Patient Measurements: Height: 5' 6.5" (168.9 cm) Weight: 250 lb (113.4 kg) IBW/kg (Calculated) : 64.95 Heparin Dosing Weight: 90 kg  Vital Signs: Temp: 98.4 F (36.9 C) (05/21 0116) Temp Source: Oral (05/21 0116) BP: 117/48 (05/21 0116) Pulse Rate: 82 (05/21 0250)  Labs:  Recent Labs  08/20/16 1937 08/21/16 0442 08/21/16 0700  HGB 15.7  --  15.1  HCT 46.4  --  44.5  PLT 245  --  235  HEPARINUNFRC  --   --  0.39  CREATININE 2.31* 2.47*  --     Estimated Creatinine Clearance: 29.9 mL/min (A) (by C-G formula based on SCr of 2.47 mg/dL (H)).   Medical History: Past Medical History:  Diagnosis Date  . Asthma   . Diabetes mellitus without complication (HCC)   . Heart disease   . Hypercholesteremia   . Hypertension   . Obstructive sleep apnea    Medications:  See electronic med rec  Assessment: 76 y.o. M presents with SOB. Pt on Pradaxa PTA for afib - last dose 5/20 1100. To hold Pradaxa and begin heparin gtt for possible PE. CBC stable. Noted CrCl ~30 ml/min. No bleeding or infusion related issues documented.   Heparin level: 0.39  Goal of Therapy:  Heparin level 0.3-0.7 units/ml Monitor platelets by anticoagulation protocol: Yes   Plan:  Continue heparin gtt at 1500 units/hr Will f/u heparin level in 8 hours Daily heparin level and CBC  Ruben Imony Sanuel Ladnier, PharmD Clinical Pharmacist 08/21/2016 8:27 AM

## 2016-08-21 NOTE — Plan of Care (Signed)
Problem: Safety: Goal: Ability to remain free from injury will improve Outcome: Not Progressing Pt. Refusing bed alarm. Has history of more than one fall in the last month.

## 2016-08-21 NOTE — Progress Notes (Signed)
ANTICOAGULATION CONSULT NOTE - Initial Consult  Pharmacy Consult for Heparin Indication: Suspected PE (pt on Pradaxa PTA for afib)  No Known Allergies  Patient Measurements: Height: 5' 6.5" (168.9 cm) Weight: 250 lb (113.4 kg) IBW/kg (Calculated) : 64.95 Heparin Dosing Weight: 90 kg  Vital Signs:    Labs:  Recent Labs  08/20/16 1937 08/21/16 0442 08/21/16 0700 08/21/16 1546  HGB 15.7  --  15.1  --   HCT 46.4  --  44.5  --   PLT 245  --  235  --   HEPARINUNFRC  --   --  0.39 0.65  CREATININE 2.31* 2.47*  --   --     Estimated Creatinine Clearance: 29.9 mL/min (A) (by C-G formula based on SCr of 2.47 mg/dL (H)).   Medical History: Past Medical History:  Diagnosis Date  . Asthma   . CHF (congestive heart failure) (HCC)    patient not sure what kind  . Diabetes mellitus without complication (HCC)   . Heart disease   . Hypercholesteremia   . Hypertension   . Obstructive sleep apnea    Medications:  See electronic med rec  Assessment: 76 y.o. M presents with SOB. Pt on Pradaxa PTA for afib - last dose 5/20 1100. To hold Pradaxa and begin heparin gtt for possible PE. CBC stable. Noted CrCl ~30 ml/min. No bleeding or infusion related issues documented.   F/u heparin level 0.65 (at goal).  No bleeding or complications noted.  Goal of Therapy:  Heparin level 0.3-0.7 units/ml Monitor platelets by anticoagulation protocol: Yes   Plan:  Continue heparin gtt at 1500 units/hr Will f/u heparin level in 8 hours Daily heparin level and CBC  Tad MooreJessica Noeli Lavery, Pharm D, BCPS  Clinical Pharmacist Pager [phone]  08/21/2016 4:52 PM

## 2016-08-21 NOTE — Care Management Obs Status (Signed)
MEDICARE OBSERVATION STATUS NOTIFICATION   Patient Details  Name: Joshua Dalton MRN: [id number] Date of Birth: 01/28/1941   Medicare Observation Status Notification Given:  Yes    Joshua Dalton, Annamarie MajorCheryl U, RN 08/21/2016, 11:46 AM

## 2016-08-21 NOTE — Progress Notes (Signed)
SATURATION QUALIFICATIONS: (This note is used to comply with regulatory documentation for home oxygen)  Patient Saturations on Room Air at Rest = 99%  Patient Saturations on Room Air while Ambulating = 97%  Patient Saturations on 0 Liters of oxygen while Ambulating = 97%  Please briefly explain why patient needs home oxygen:  Pt does NOT qualify for home O2.

## 2016-08-21 NOTE — ED Notes (Signed)
Report attempted 

## 2016-08-21 NOTE — Progress Notes (Addendum)
Per order, unable to perform Home O2 desaturation qualification, as pt has broken Rigth ankle, ina cast, and is NWB.  MD notified.  1530 - Pt WAS able to ambulate with walker, so that we were able to complete Home O2 qualification.  His sats did not drop below 97% on room air; therefore, he does NOT qualify for home O2 at this time.  Pt did, however, have labored "mouth" breathing, but was not in any distress.  His HR accelerated to 135 non-sustaining. MD has been notified.

## 2016-08-21 NOTE — Progress Notes (Signed)
RT placed patient on CPAP HS. 2L O2 bleed in needed. Patient tolerating well.  ?

## 2016-08-22 ENCOUNTER — Inpatient Hospital Stay (HOSPITAL_COMMUNITY): Payer: Medicare Other

## 2016-08-22 LAB — GLUCOSE, CAPILLARY
GLUCOSE-CAPILLARY: 113 mg/dL — AB (ref 65–99)
GLUCOSE-CAPILLARY: 143 mg/dL — AB (ref 65–99)
GLUCOSE-CAPILLARY: 205 mg/dL — AB (ref 65–99)
Glucose-Capillary: 117 mg/dL — ABNORMAL HIGH (ref 65–99)
Glucose-Capillary: 288 mg/dL — ABNORMAL HIGH (ref 65–99)
Glucose-Capillary: 287 mg/dL — ABNORMAL HIGH (ref 65–99)

## 2016-08-22 LAB — CBC AND DIFFERENTIAL
HCT: 44 % (ref 41–53)
Hemoglobin: 14.6 g/dL (ref 13.5–17.5)
Platelets: 247 10*3/uL (ref 150–399)
WBC: 10.2 10^3/mL

## 2016-08-22 LAB — BASIC METABOLIC PANEL
ANION GAP: 10 (ref 5–15)
BUN: 34 mg/dL — ABNORMAL HIGH (ref 6–20)
BUN: 34 mg/dL — AB (ref 4–21)
CALCIUM: 9.2 mg/dL (ref 8.9–10.3)
CO2: 25 mmol/L (ref 22–32)
CREATININE: 2.3 mg/dL — AB (ref 0.6–1.3)
Chloride: 104 mmol/L (ref 101–111)
Creatinine, Ser: 2.25 mg/dL — ABNORMAL HIGH (ref 0.61–1.24)
GFR calc Af Amer: 31 mL/min — ABNORMAL LOW (ref >60)
GFR, EST NON AFRICAN AMERICAN: 26 mL/min — AB (ref >60)
Glucose, Bld: 99 mg/dL (ref 65–99)
Glucose: 99 mg/dL
POTASSIUM: 3.9 mmol/L (ref 3.5–5.1)
Potassium: 3.9 mmol/L (ref 3.4–5.3)
SODIUM: 139 mmol/L (ref 137–147)
SODIUM: 139 mmol/L (ref 135–145)

## 2016-08-22 LAB — CBC
HEMATOCRIT: 44.2 % (ref 39.0–52.0)
Hemoglobin: 14.6 g/dL (ref 13.0–17.0)
MCH: 32.2 pg (ref 26.0–34.0)
MCHC: 33 g/dL (ref 30.0–36.0)
MCV: 97.4 fL (ref 78.0–100.0)
PLATELETS: 247 10*3/uL (ref 150–400)
RBC: 4.54 MIL/uL (ref 4.22–5.81)
RDW: 13.9 % (ref 11.5–15.5)
WBC: 10.2 10*3/uL (ref 4.0–10.5)

## 2016-08-22 LAB — HEPARIN LEVEL (UNFRACTIONATED): Heparin Unfractionated: 0.95 IU/mL — ABNORMAL HIGH (ref 0.30–0.70)

## 2016-08-22 MED ORDER — PANTOPRAZOLE SODIUM 40 MG PO TBEC
40.0000 mg | DELAYED_RELEASE_TABLET | Freq: Two times a day (BID) | ORAL | Status: DC
  Administered 2016-08-22 – 2016-08-26 (×9): 40 mg via ORAL
  Filled 2016-08-22 (×9): qty 1

## 2016-08-22 MED ORDER — DABIGATRAN ETEXILATE MESYLATE 150 MG PO CAPS
150.0000 mg | ORAL_CAPSULE | Freq: Two times a day (BID) | ORAL | Status: DC
  Administered 2016-08-22 – 2016-08-23 (×2): 150 mg via ORAL
  Filled 2016-08-22 (×2): qty 1

## 2016-08-22 MED ORDER — FUROSEMIDE 40 MG PO TABS
40.0000 mg | ORAL_TABLET | Freq: Every day | ORAL | Status: DC
  Administered 2016-08-22 – 2016-08-25 (×4): 40 mg via ORAL
  Filled 2016-08-22 (×4): qty 1

## 2016-08-22 MED ORDER — FINASTERIDE 5 MG PO TABS
5.0000 mg | ORAL_TABLET | Freq: Every day | ORAL | Status: DC
  Administered 2016-08-22 – 2016-08-26 (×5): 5 mg via ORAL
  Filled 2016-08-22 (×5): qty 1

## 2016-08-22 NOTE — Progress Notes (Signed)
PROGRESS NOTE    Joshua Dalton  ZOX:[id number] DOB: [birth year] DOA: 08/20/2016 PCP: System, Pcp Not In   Outpatient Specialists:     Brief Narrative:  Joshua Dalton is a 76 y.o. male with medical history significant of DM2, HTN, OSA, CKD.  Patient presents to the ED with c/o SOB.  Patient was initially evaluated at outside hospital when symptoms onset 3 weeks ago and had a negative stress test performed. Patient states his symptoms have worsened over that time. 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 Patient had recent trip to [facility] 2 weeks ago in which he fell and broke his right ankle. Patient flew back to [facility] to be with his daughter as ankle is being taken care of. Patient is originally from [place].   ED Course: Creat 2.3, unable to get CTA.  EKG shows RBBB, no prior available for comparison.  O2 sats in upper 90s on room air, lung sounds clear but patient has tachypnea despite this.  Trop and BNP neg.   Assessment & Plan:   Principal Problem:   Dyspnea Active Problems:   DM2 (diabetes mellitus, type 2) (HCC)   HTN (hypertension)   CKD (chronic kidney disease)   Dyspnea - per patient known h/o CHF but not sure what kind VQ scan normal Has gained 30 lbs in last few months? BNP normal RBBB noted, no prior available for comparision 2d echo: normal EF- grade 1 diastolic dsfxn Resume prodaxa -request records from PCP - still not arrived  Dysphagia -SLP eval -esophagus dg  CKD - unknown baseline Resume lasix  DM2 - Lantus 40 units QHS SSI- change to QID once no longer NPO  H/o DVT - resume prodaxa  HTN - stable  ? pulm nodule -needs repeat x ray with nipple marker as an outpatient  obesity Body mass index is 39.75 kg/m.  DVT prophylaxis:  Fully anticoagulated   Code Status: Full Code   Family Communication: Called daughter- no answer  Disposition Plan:     Consultants:     Subjective: No further SOB  but now c/o several week h/o dysphagia  Objective: Vitals:   08/21/16 1644 08/21/16 2115 08/22/16 0024 08/22/16 0458  BP: (!) 116/95 94/60 (!) 129/58 114/64  Pulse: 71 74 71 61  Resp: 18 18 18 18   Temp: 98.2 F (36.8 C) 98.4 F (36.9 C) 98.8 F (37.1 C) 98.2 F (36.8 C)  TempSrc: Oral  Oral Oral  SpO2: 95% 95% 97% 94%  Weight:      Height:        Intake/Output Summary (Last 24 hours) at 08/22/16 1137 Last data filed at 08/22/16 0930  Gross per 24 hour  Intake           1255.5 ml  Output              875 ml  Net            380.5 ml   Filed Weights   08/20/16 1758  Weight: 113.4 kg (250 lb)    Examination:  General exam: Appears calm and comfortable - hard of hearing Respiratory system: clear and diminished Cardiovascular system: rrr Gastrointestinal system: +BS, obese. Central nervous system: Alert and oriented. No focal neurological deficits. Extremities: Symmetric 5 x 5 power. Skin: No rashes, lesions or ulcers Psychiatry: Judgement and insight appear normal. Mood & affect appropriate.     Data Reviewed: I have personally reviewed following labs and imaging studies  CBC:  Recent Labs Lab 08/20/16 1937 08/21/16 0700 08/22/16 0510  WBC 11.6* 10.1 10.2  HGB 15.7 15.1 14.6  HCT 46.4 44.5 44.2  MCV 96.7 97.8 97.4  PLT 245 235 247   Basic Metabolic Panel:  Recent Labs Lab 08/20/16 1937 08/21/16 0442 08/22/16 0510  NA 135 138 139  K 4.4 3.6 3.9  CL 103 105 104  CO2 21* 25 25  GLUCOSE 298* 216* 99  BUN 36* 37* 34*  CREATININE 2.31* 2.47* 2.25*  CALCIUM 10.0 9.4 9.2   GFR: Estimated Creatinine Clearance: 32.8 mL/min (A) (by C-G formula based on SCr of 2.25 mg/dL (H)). Liver Function Tests: No results for input(s): AST, ALT, ALKPHOS, BILITOT, PROT, ALBUMIN in the last 168 hours. No results for input(s): LIPASE, AMYLASE in the last 168 hours. No results for input(s): AMMONIA in the last 168 hours. Coagulation Profile: No results for input(s):  INR, PROTIME in the last 168 hours. Cardiac Enzymes: No results for input(s): CKTOTAL, CKMB, CKMBINDEX, TROPONINI in the last 168 hours. BNP (last 3 results) No results for input(s): PROBNP in the last 8760 hours. HbA1C: No results for input(s): HGBA1C in the last 72 hours. CBG:  Recent Labs Lab 08/21/16 1622 08/21/16 2111 08/22/16 0020 08/22/16 0454 08/22/16 0727  GLUCAP 224* 204* 143* 113* 117*   Lipid Profile: No results for input(s): CHOL, HDL, LDLCALC, TRIG, CHOLHDL, LDLDIRECT in the last 72 hours. Thyroid Function Tests:  Recent Labs  08/21/16 0832  TSH 3.016   Anemia Panel: No results for input(s): VITAMINB12, FOLATE, FERRITIN, TIBC, IRON, RETICCTPCT in the last 72 hours. Urine analysis: No results found for: COLORURINE, APPEARANCEUR, LABSPEC, PHURINE, GLUCOSEU, HGBUR, BILIRUBINUR, KETONESUR, PROTEINUR, UROBILINOGEN, NITRITE, LEUKOCYTESUR   )No results found for this or any previous visit (from the past 240 hour(s)).    Anti-infectives    None       Radiology Studies: Dg Chest 2 View  Result Date: 08/20/2016 CLINICAL DATA:  Shortness of breath EXAM: CHEST  2 VIEW COMPARISON:  Aug 03, 2003 FINDINGS: A rounded density projects over the lateral right lung base, not the when seen previously. No other nodules or masses. No focal infiltrates. No pneumothorax. The cardiomediastinal silhouette is unremarkable. IMPRESSION: Nodular density projected over the lateral right lung base may represent a nipple shadow or pulmonary nodule based on this study. Recommend a repeat film with nipple markers to confirm. No focal infiltrate. Electronically Signed   By: Gerome Samavid  Williams III M.D   On: 08/20/2016 18:40   Nm Pulmonary Perf And Vent  Result Date: 08/21/2016 CLINICAL DATA:  Shortness of breath today. EXAM: NUCLEAR MEDICINE VENTILATION - PERFUSION LUNG SCAN TECHNIQUE: Ventilation images were obtained in multiple projections using inhaled aerosol Tc-7526m DTPA. Perfusion images  were obtained in multiple projections after intravenous injection of Tc-7726m MAA. RADIOPHARMACEUTICALS:  31.5 mCi Technetium-4426m DTPA aerosol inhalation and 4.4 mCi Technetium-3326m MAA IV COMPARISON:  Chest x-ray 08/20/2016 FINDINGS: Ventilation: Normal ventilation study. Perfusion: Normal perfusion study. IMPRESSION: Negative V/Q scan for pulmonary embolism. Electronically Signed   By: Rudie MeyerP.  Gallerani M.D.   On: 08/21/2016 15:30        Scheduled Meds: . allopurinol  300 mg Oral Daily  . dabigatran  150 mg Oral BID  . fenofibrate  160 mg Oral Daily  . FLUoxetine  20 mg Oral Daily  . insulin aspart  0-15 Units Subcutaneous Q4H  . insulin glargine  40 Units Subcutaneous QHS  . loratadine  10 mg Oral Daily  . oxybutynin  10  mg Oral QHS  . pantoprazole  40 mg Oral BID  . pravastatin  80 mg Oral q1800  . sodium chloride flush  3 mL Intravenous Q12H  . tamsulosin  0.4 mg Oral Daily   Continuous Infusions:    LOS: 1 day    Time spent: 25 min    Cedrick Partain U Cylinda Santoli, DO [facility] Pager [phone]  If 7PM-7AM, please contact night-coverage [email or web address] Password [facility] 08/22/2016, 11:37 AM

## 2016-08-22 NOTE — Consult Note (Signed)
Referring Provider:  Dr. Benjamine MolaVann    Primary Care Physician:  System, Pcp Not In Primary Gastroenterologist:  Gentry FitzUnassigned  Reason for Consultation:  Dysphagia, abnormal barium swallow  HPI: Joshua Dalton is a 76 y.o. 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 Patient was complaining of dysphagia. Barium swallow was ordered by admitting team which showed no stricture or mass but 13 mm barium tablet failed to pass the GE junction. GI is consulted for further evaluation.  Patient seen and examined. Family at bedside. Patient complaining of dyspnea with minimal exertion. He is also complaining of dysphagia mostly to solids but occasional dysphagia to liquids since more than 1 month. [facility] but because of his right ankle fracture [place]. Patient is complaining of ongoing chest discomfort. Denied any acid reflux. No previous EGD. Denied any NSAID use. Patient is gaining weight  Had colonoscopy several years ago which was unremarkable according to patient. No previous EGD. Family history of which official cancer or colon cancer.  Past Medical History:  Diagnosis Date  . Asthma   . CHF (congestive heart failure) (HCC)    patient not sure what kind  . Diabetes mellitus without complication (HCC)   . Heart disease   . Hypercholesteremia   . Hypertension   . Obstructive sleep apnea     Past Surgical History:  Procedure Laterality Date  . CARDIAC SURGERY      Prior to Admission medications   Medication Sig Start Date End Date Taking? Authorizing Provider  allopurinol (ZYLOPRIM) 300 MG tablet Take 300 mg by mouth daily.   Yes [provider]  dabigatran (PRADAXA) 150 MG CAPS capsule Take 150 mg by mouth 2 (two) times daily.   Yes [provider]   fenofibrate (TRICOR) 145 MG tablet Take 145 mg by mouth daily.   Yes [provider]  fexofenadine (ALLEGRA) 180 MG tablet Take 180 mg by mouth daily.   Yes [provider]  finasteride (PROSCAR) 5 MG tablet Take 5 mg by mouth daily.   Yes [provider]  FLUoxetine (PROZAC) 20 MG capsule Take 20 mg by mouth daily.   Yes [provider]  furosemide (LASIX) 40 MG tablet Take 40 mg by mouth daily.   Yes [provider]  insulin aspart (NOVOLOG) 100 UNIT/ML injection Inject 20 Units into the skin 3 (three) times daily before meals.   Yes [provider]  insulin glargine (LANTUS) 100 UNIT/ML injection Inject 70 Units into the skin at bedtime.   Yes [provider]  lisinopril (PRINIVIL,ZESTRIL) 10 MG tablet Take 10 mg by mouth daily.   Yes [provider]  nitroGLYCERIN (NITROSTAT) 0.4 MG SL tablet Place 0.4 mg under the tongue every 5 (five) minutes as needed for chest pain.   Yes [provider]  oxybutynin (DITROPAN-XL) 10 MG 24 hr tablet Take 10 mg by mouth at bedtime.   Yes [provider]  pravastatin (PRAVACHOL) 80 MG tablet Take 80 mg by mouth daily.   Yes [provider]  tamsulosin (FLOMAX) 0.4 MG CAPS capsule Take 0.4 mg by mouth daily.    Yes [provider]    Scheduled Meds: . allopurinol  300 mg Oral Daily  . dabigatran  150 mg Oral BID  . fenofibrate  160 mg Oral Daily  .  finasteride  5 mg Oral Daily  . FLUoxetine  20 mg Oral Daily  . furosemide  40 mg Oral Daily  . insulin aspart  0-15 Units Subcutaneous Q4H  . insulin glargine  40 Units Subcutaneous QHS  . loratadine  10 mg Oral Daily  . oxybutynin  10 mg Oral QHS  . pantoprazole  40 mg Oral BID  . pravastatin  80 mg Oral q1800  . sodium chloride flush  3 mL Intravenous Q12H  . tamsulosin  0.4 mg Oral Daily   Continuous Infusions: PRN Meds:.  Allergies as of 08/20/2016  . (No Known Allergies)    History  reviewed. No pertinent family history.  Social History   Social History  . Marital status: Widowed    Spouse name: N/A  . Number of children: N/A  . Years of education: N/A   Occupational History  . Not on file.   Social History Main Topics  . Smoking status: Former Games developer  . Smokeless tobacco: Never Used  . Alcohol use No  . Drug use: Unknown  . Sexual activity: Not on file   Other Topics Concern  . Not on file   Social History Narrative  . No narrative on file    Review of Systems: Review of Systems  Constitutional: Negative.   HENT: Positive for congestion. Negative for ear discharge, ear pain and nosebleeds.   Eyes: Negative for blurred vision, double vision, photophobia and pain.  Respiratory: Positive for cough and shortness of breath. Negative for hemoptysis.   Cardiovascular: Positive for chest pain and orthopnea.  Gastrointestinal: Positive for constipation. Negative for abdominal pain, blood in stool, heartburn, melena, nausea and vomiting.  Genitourinary: Negative for flank pain, frequency and hematuria.  Musculoskeletal: Positive for back pain, joint pain and myalgias. Negative for neck pain.  Skin: Negative for itching and rash.  Neurological: Negative for speech change, focal weakness and seizures.  Endo/Heme/Allergies: Negative for polydipsia. Does not bruise/bleed easily.  Psychiatric/Behavioral: Negative for hallucinations and suicidal ideas.    Physical Exam: Vital signs: Vitals:   08/22/16 0458 08/22/16 1227  BP: 114/64 132/67  Pulse: 61 73  Resp: 18 18  Temp: 98.2 F (36.8 C)    Last BM Date: 08/21/16 Physical Exam  Constitutional: He is oriented to person, place, and time. He appears well-developed and well-nourished. No distress.  HENT:  Head: Normocephalic and atraumatic.  Nose: Nose normal.  Mouth/Throat: Oropharynx is clear and moist. No oropharyngeal exudate.  Eyes: EOM are normal. Pupils are equal, round, and reactive to light.   Neck: Normal range of motion. No tracheal deviation present.  Cardiovascular: Normal rate, regular rhythm and normal heart sounds.   Pulmonary/Chest: Effort normal. He has no wheezes.  Decreased breath sounds bilaterally with left basilar fine crackles. No respiratory distress.  good situation currently on room air.  Abdominal: Soft. Bowel sounds are normal. He exhibits distension. He exhibits no mass. There is no tenderness. There is no rebound and no guarding.  Musculoskeletal: Normal range of motion. He exhibits no edema or tenderness.  Neurological: He is alert and oriented to person, place, and time.  Skin: Skin is warm. No erythema.  Psychiatric: He has a normal mood and affect. His behavior is normal. Thought content normal.  Vitals reviewed.  GI:  Lab Results:  Recent Labs  08/20/16 1937 08/21/16 0700 08/22/16 0510  WBC 11.6* 10.1 10.2  HGB 15.7 15.1 14.6  HCT 46.4 44.5 44.2  PLT 245 235 247   BMET  Recent Labs  08/20/16 1937 08/21/16 0442 08/22/16 0510  NA 135 138 139  K 4.4 3.6 3.9  CL 103 105 104  CO2 21* 25 25  GLUCOSE 298* 216* 99  BUN 36* 37* 34*  CREATININE 2.31* 2.47* 2.25*  CALCIUM 10.0 9.4 9.2   LFT No results for input(s): PROT, ALBUMIN, AST, ALT, ALKPHOS, BILITOT, BILIDIR, IBILI in the last 72 hours. PT/INR No results for input(s): LABPROT, INR in the last 72 hours.   Studies/Results: Dg Chest 2 View  Result Date: 08/20/2016 CLINICAL DATA:  Shortness of breath EXAM: CHEST  2 VIEW COMPARISON:  Aug 03, 2003 FINDINGS: A rounded density projects over the lateral right lung base, not the when seen previously. No other nodules or masses. No focal infiltrates. No pneumothorax. The cardiomediastinal silhouette is unremarkable. IMPRESSION: Nodular density projected over the lateral right lung base may represent a nipple shadow or pulmonary nodule based on this study. Recommend a repeat film with nipple markers to confirm. No focal infiltrate.  Electronically Signed   By: Gerome Sam III M.D   On: 08/20/2016 18:40   Dg Esophagus  Result Date: 08/22/2016 CLINICAL DATA:  Difficulty swallowing. EXAM: ESOPHOGRAM / BARIUM SWALLOW / BARIUM TABLET STUDY TECHNIQUE: Single contrast examination performed using thick barium liquid and thin barium liquid. The patient was observed with fluoroscopy swallowing a 13 mm barium sulphate tablet. FLUOROSCOPY TIME:  Fluoroscopy Time:  3 minutes 18 seconds Radiation Exposure Index (if provided by the fluoroscopic device): 65.2 Number of Acquired Spot Images: 7 COMPARISON:  None. FINDINGS: Limited exam due to patient immobility. The esophagus is patulous. There is no stricture or mass within the mid esophagus or distal esophagus. No mucosal irregularity identified. 13 mm barium tablet failed the passed the GE junction. Attempts to advanced tablet with water and thin barium as well as LEFT -sided down patient positioning. Patient could not be placed up RIGHT due to ankle fracture. IMPRESSION: 1. A 13 mm barium fail to pass the GE junction. This may be in part due to limited patient positioning (patient could not be imaged upright); however cannot exclude a distal esophageal stricture. Consider endoscopy for further evaluation. 2. 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 Electronically Signed   By: Genevive Bi M.D.   On: 08/22/2016 13:49   Nm Pulmonary Perf And Vent  Result Date: 08/21/2016 CLINICAL DATA:  Shortness of breath today. EXAM: NUCLEAR MEDICINE VENTILATION - PERFUSION LUNG SCAN TECHNIQUE: Ventilation images were obtained in multiple projections using inhaled aerosol Tc-57m DTPA. Perfusion images were obtained in multiple projections after intravenous injection of Tc-70m MAA. RADIOPHARMACEUTICALS:  31.5 mCi Technetium-49m DTPA aerosol inhalation and 4.4 mCi Technetium-1m MAA IV COMPARISON:  Chest x-ray 08/20/2016 FINDINGS: Ventilation: Normal ventilation study. Perfusion: Normal perfusion study.  IMPRESSION: Negative V/Q scan for pulmonary embolism. Electronically Signed   By: Rudie Meyer M.D.   On: 08/21/2016 15:30    Impression/Plan: - Dysphagia to both solids and liquids. Barium swallow showed no stricture or mass but 13 mm barium pill fail to pass GE junction. - Ongoing shortness of breath with minimal exertion - History of DVT. On Pradaxa  - Chronic kidney disease.  Recommendations ------------------------- - Patient with ongoing shortness of breath with minimal exertion. Last dose of Pradaxa this morning. Given his chronic kidney insufficiency Pradaxa  needs to be on hold for at least 2-3 days prior to any therapeutic endoscopic intervention such as dilatation. Daughter and patient are  not sure of holding anticoagulation at this point. They  will discuss with admitting team further tomorrow. Again,  patient dysphagia is been ongoing for several weeks now. He was scheduled for outpatient [facility].Patent does not have any acute nausea and vomiting and is able to tolerate food at this point. - GI will follow    LOS: 1 day   Kathi Der  MD, FACP 08/22/2016, 3:51 PM  Pager [phone] If no answer or after 5 PM call [phone]

## 2016-08-22 NOTE — Progress Notes (Signed)
ANTICOAGULATION CONSULT NOTE - Follow-up Consult  Pharmacy Consult for Heparin Indication: Suspected PE (pt on Pradaxa PTA for afib)  No Known Allergies  Patient Measurements: Height: 5' 6.5" (168.9 cm) Weight: 250 lb (113.4 kg) IBW/kg (Calculated) : 64.95 Heparin Dosing Weight: 90 kg  Vital Signs: Temp: 98.2 F (36.8 C) (05/22 0458) Temp Source: Oral (05/22 0458) BP: 114/64 (05/22 0458) Pulse Rate: 61 (05/22 0458)  Labs:  Recent Labs  08/20/16 1937 08/21/16 0442 08/21/16 0700 08/21/16 1546 08/22/16 0510  HGB 15.7  --  15.1  --  14.6  HCT 46.4  --  44.5  --  44.2  PLT 245  --  235  --  247  HEPARINUNFRC  --   --  0.39 0.65 0.95*  CREATININE 2.31* 2.47*  --   --   --     Estimated Creatinine Clearance: 29.9 mL/min (A) (by C-G formula based on SCr of 2.47 mg/dL (H)).  Assessment: 76 y.o. M presents with SOB. Pt on Pradaxa PTA for afib - last dose 5/20 1100. To hold Pradaxa and begin heparin gtt for possible PE. Pradaxa does not affect anti-Xa levels. Heparin level up to supratherapeutic (0.95) on gtt at 1500 units/hr. No bleeding noted.  Goal of Therapy:  Heparin level 0.3-0.7 units/ml Monitor platelets by anticoagulation protocol: Yes   Plan:  Decrease heparin gtt to 1300 units/hr Will f/u heparin level in 8 hours  Christoper Fabianaron [place]arden, PharmD, BCPS Clinical pharmacist, pager [phone]  08/22/2016 6:32 AM

## 2016-08-22 NOTE — Evaluation (Signed)
Clinical/Bedside Swallow Evaluation Patient Details  Name: Joshua Dalton MRN: [id number] Date of Birth: 06/06/1940  Today's Date: 08/22/2016 Time: SLP Start Time (ACUTE ONLY): 1455 SLP Stop Time (ACUTE ONLY): 1514 SLP Time Calculation (min) (ACUTE ONLY): 19 min  Past Medical History:  Past Medical History:  Diagnosis Date  . Asthma   . CHF (congestive heart failure) (HCC)    patient not sure what kind  . Diabetes mellitus without complication (HCC)   . Heart disease   . Hypercholesteremia   . Hypertension   . Obstructive sleep apnea    Past Surgical History:  Past Surgical History:  Procedure Laterality Date  . CARDIAC SURGERY     HPI:  76 y.o.malewith medical history significant of DM2, HTN, OSA, CKD. Patient presents to the ED with c/o SOB. Patient was initially evaluated at outside hospital when symptoms onset3 weeks ago and had a negative stress test performed. Patient states his symptoms haveworsenedover that time. 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 Patient had recent trip to [place] two weeks ago, during which he fell and broke his right ankle. [place] to be with his daughter as he recuperates from fracture.  Dx dyspnea, dysphagia with hx esophageal deficits per notes, hence orders for SLP swallow evaluation. Barium swallow completed 5/22: IMPRESSION: "1. A 13 mm barium fail to pass the GE junction. This may be in part due to limited patient positioning (patient could not be imaged upright); however cannot exclude a distal esophageal stricture. Consider endoscopy for further evaluation. 2. 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  He clears throat constantly, per his description, regardless of eating or not.  He describes inability to finish a meal the last  several weeks due to globus.  We discussed the recommendations from today's barium swallow, which revealed potential stricture and rec endoscopy.  Encouraged pt to pursue endoscopy and discuss with MD.  There are no s/s of an oropharyngeal component to his dysphagia.  Will follow briefly for education re: esophageal strategies.  SLP Visit Diagnosis: Dysphagia, unspecified (R13.10)    Aspiration Risk  No limitations    Diet Recommendation     Medication Administration: Whole meds with liquid    Other  Recommendations Recommended Consults: Consider GI evaluation Oral Care Recommendations: Oral care BID   Follow up Recommendations None      Frequency and Duration min 1 x/week  1 week       Prognosis        Swallow Study   General Date of Onset: 08/20/16 HPI: 76 y.o.malewith medical history significant of DM2, HTN, OSA, CKD. Patient presents to the ED with c/o SOB. Patient was initially evaluated at outside hospital when symptoms onset3 weeks ago and had a negative stress test performed. Patient states his symptoms haveworsenedover that time. 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 [place] [facility] [place] [facility] in which he fell and broke his right ankle. [place] be with his daughter as ankle is being taken care of.  Dx dyspnea, dysphagia with hx esophageal deficits per notes, hence orders for SLP swallow evaluation. Barium swallow completed 5/22: IMPRESSION: "1. A 13 mm barium fail to pass the GE junction. This may be in part due to limited patient positioning (patient could not  be imaged upright); however cannot exclude a distal esophageal stricture. Consider endoscopy for further evaluation. 2. 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." Type of Study: Bedside Swallow Evaluation Previous Swallow Assessment: no Diet Prior to this Study: Regular;Thin liquids Temperature Spikes Noted: No Respiratory Status: Room air (CPAP at  night) History of Recent Intubation: No Behavior/Cognition: Alert;Cooperative;Pleasant mood Oral Cavity Assessment: Within Functional Limits Oral Care Completed by SLP: No Oral Cavity - Dentition: Adequate natural dentition Vision: Functional for self-feeding Self-Feeding Abilities: Able to feed self Patient Positioning: Upright in bed Baseline Vocal Quality: Normal Volitional Cough: Strong Volitional Swallow: Able to elicit    Oral/Motor/Sensory Function Overall Oral Motor/Sensory Function: Within functional limits   Ice Chips Ice chips: Not tested   Thin Liquid Thin Liquid: Within functional limits Presentation: Cup;Self Fed    Nectar Thick Nectar Thick Liquid: Not tested   Honey Thick Honey Thick Liquid: Not tested   Puree Puree: Within functional limits   Solid   GO   Solid: Not tested       Joshua Dalton, [place] CCC/SLP Pager [phone]  Joshua Dalton 08/22/2016,3:25 PM

## 2016-08-22 NOTE — Progress Notes (Signed)
Patient places self on and off CPAP.

## 2016-08-22 NOTE — Care Management Note (Signed)
    Durable Medical Equipment        Start     Ordered   08/22/16 1234  For home use only DME standard manual wheelchair with seat cushion  Once    Comments:  Patient suffers from fracture which impairs their ability to perform daily activities like weakness in the home.  A walker will not resolve  issue with performing activities of daily living. A wheelchair will allow patient to safely perform daily activities. Patient can safely propel the wheelchair in the home or has a caregiver who can provide assistance.  Accessories: elevating leg rests (ELRs), wheel locks, extensions and anti-tippers.   08/22/16 1234

## 2016-08-23 ENCOUNTER — Inpatient Hospital Stay (HOSPITAL_COMMUNITY): Payer: Medicare Other

## 2016-08-23 LAB — GLUCOSE, CAPILLARY
GLUCOSE-CAPILLARY: 182 mg/dL — AB (ref 65–99)
GLUCOSE-CAPILLARY: 171 mg/dL — AB (ref 65–99)
Glucose-Capillary: 178 mg/dL — ABNORMAL HIGH (ref 65–99)
Glucose-Capillary: 226 mg/dL — ABNORMAL HIGH (ref 65–99)
Glucose-Capillary: 277 mg/dL — ABNORMAL HIGH (ref 65–99)
Glucose-Capillary: 301 mg/dL — ABNORMAL HIGH (ref 65–99)

## 2016-08-23 LAB — CBC AND DIFFERENTIAL
HCT: 45 % (ref 41–53)
HEMOGLOBIN: 15.5 g/dL (ref 13.5–17.5)
Platelets: 246 10*3/uL (ref 150–399)
WBC: 12.2 10^3/mL

## 2016-08-23 LAB — BASIC METABOLIC PANEL
ANION GAP: 9 (ref 5–15)
BUN: 31 mg/dL — AB (ref 4–21)
BUN: 31 mg/dL — ABNORMAL HIGH (ref 6–20)
CALCIUM: 9.4 mg/dL (ref 8.9–10.3)
CHLORIDE: 104 mmol/L (ref 101–111)
CO2: 22 mmol/L (ref 22–32)
Creatinine, Ser: 2.23 mg/dL — ABNORMAL HIGH (ref 0.61–1.24)
Creatinine: 2.2 mg/dL — AB (ref 0.6–1.3)
GFR calc non Af Amer: 27 mL/min — ABNORMAL LOW (ref >60)
GFR, EST AFRICAN AMERICAN: 31 mL/min — AB (ref >60)
Glucose, Bld: 180 mg/dL — ABNORMAL HIGH (ref 65–99)
Glucose: 180 mg/dL
POTASSIUM: 4 mmol/L (ref 3.4–5.3)
Potassium: 4 mmol/L (ref 3.5–5.1)
SODIUM: 135 mmol/L — AB (ref 137–147)
Sodium: 135 mmol/L (ref 135–145)

## 2016-08-23 LAB — CBC
HEMATOCRIT: 44.8 % (ref 39.0–52.0)
HEMOGLOBIN: 15.5 g/dL (ref 13.0–17.0)
MCH: 33.5 pg (ref 26.0–34.0)
MCHC: 34.6 g/dL (ref 30.0–36.0)
MCV: 97 fL (ref 78.0–100.0)
Platelets: 246 10*3/uL (ref 150–400)
RBC: 4.62 MIL/uL (ref 4.22–5.81)
RDW: 13.7 % (ref 11.5–15.5)
WBC: 12.2 10*3/uL — AB (ref 4.0–10.5)

## 2016-08-23 MED ORDER — INSULIN ASPART 100 UNIT/ML ~~LOC~~ SOLN
0.0000 [IU] | Freq: Every day | SUBCUTANEOUS | Status: DC
  Administered 2016-08-23 – 2016-08-24 (×2): 3 [IU] via SUBCUTANEOUS
  Administered 2016-08-25: 2 [IU] via SUBCUTANEOUS

## 2016-08-23 MED ORDER — ALBUTEROL SULFATE (2.5 MG/3ML) 0.083% IN NEBU: 2.5000 mg | INHALATION_SOLUTION | RESPIRATORY_TRACT | Status: DC | PRN

## 2016-08-23 MED ORDER — ENOXAPARIN SODIUM 40 MG/0.4ML ~~LOC~~ SOLN
40.0000 mg | SUBCUTANEOUS | Status: AC
  Administered 2016-08-23 – 2016-08-25 (×3): 40 mg via SUBCUTANEOUS
  Filled 2016-08-23 (×3): qty 0.4

## 2016-08-23 MED ORDER — INSULIN ASPART 100 UNIT/ML ~~LOC~~ SOLN
0.0000 [IU] | Freq: Three times a day (TID) | SUBCUTANEOUS | Status: DC
  Administered 2016-08-23: 8 [IU] via SUBCUTANEOUS
  Administered 2016-08-24: 3 [IU] via SUBCUTANEOUS
  Administered 2016-08-24 (×2): 5 [IU] via SUBCUTANEOUS
  Administered 2016-08-25: 8 [IU] via SUBCUTANEOUS
  Administered 2016-08-25: 3 [IU] via SUBCUTANEOUS
  Administered 2016-08-25: 2 [IU] via SUBCUTANEOUS
  Administered 2016-08-26: 3 [IU] via SUBCUTANEOUS
  Administered 2016-08-26: 2 [IU] via SUBCUTANEOUS

## 2016-08-23 NOTE — Evaluation (Signed)
Physical Therapy Evaluation Patient Details Name: Joshua Dalton MRN: [id number] DOB: 07-Feb-1941 Today's Date: 08/23/2016   History of Present Illness  Athel Merriweather is a 76 y.o. male with medical history significant of DM2, HTN, OSA, CKD.  Patient presents to the ED with c/o SOB.  Patient was initially evaluated at outside hospital when symptoms onset 3 weeks ago and had a negative stress test performed. Patient states his symptoms have worsened over that time. 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 Patient had recent trip to [facility] 2 weeks ago in which he fell and broke his right ankle.  Clinical Impression  Patient presents with decreased mobility due to deficits listed in PT problem list including limitations in weight bearing and difficulty with following through on precautions.  Feel he can benefit from skilled PT in the acute setting as well as follow up SNF level rehab at d/c as daughter works and pt continues with difficulty mobilizing and maintaining NWB.     Follow Up Recommendations SNF;Supervision/Assistance - 24 hour    Equipment Recommendations  Wheelchair cushion (measurements PT);Wheelchair (measurements PT)    Recommendations for Other Services       Precautions / Restrictions Precautions Precautions: Fall Restrictions Weight Bearing Restrictions: Yes RLE Weight Bearing: Non weight bearing      Mobility  Bed Mobility Overal bed mobility: Modified Independent                Transfers Overall transfer level: Needs assistance Equipment used: Rolling walker (2 wheeled) Transfers: Sit to/from Stand Sit to Stand: Min assist         General transfer comment: increased time, used momentum, cannot perform NWB  Ambulation/Gait Ambulation/Gait assistance: Min guard Ambulation Distance (Feet): 12 Feet Assistive device: Rolling walker (2 wheeled) Gait Pattern/deviations: Step-to pattern     General Gait Details: weight on R LE  despite cues  Stairs            Wheelchair Mobility    Modified Rankin (Stroke Patients Only)       Balance Overall balance assessment: Needs assistance   Sitting balance-Leahy Scale: Good     Standing balance support: Bilateral upper extremity supported Standing balance-Leahy Scale: Poor Standing balance comment: UE support for standing                             Pertinent Vitals/Pain Pain Assessment: 0-10 Pain Score: 3  Pain Location: R ankle Pain Descriptors / Indicators: Aching Pain Intervention(s): Monitored during session;Repositioned    Home Living Family/patient expects to be discharged to:: Skilled nursing facility Living Arrangements: Children Available Help at Discharge: Available PRN/intermittently;Family Type of Home: House Home Access: Level entry     Home Layout: Two level;1/2 bath on main level Home Equipment: Walker - 2 wheels;Grab bars - tub/shower      Prior Function Level of Independence: Independent with assistive device(s)         Comments: has been walking to bathoom at daughter's home putting weight on R LE     Hand Dominance        Extremity/Trunk Assessment   Upper Extremity Assessment Upper Extremity Assessment: Overall WFL for tasks assessed    Lower Extremity Assessment Lower Extremity Assessment: RLE deficits/detail RLE Deficits / Details: can wiggle toes, has discolored area over MTP's of toes 3-4 and discoloration of little toe nail; reports painful pressure over little toe in cast, but mainly when weight  bearing RLE: Unable to fully assess due to immobilization       Communication   Communication: No difficulties  Cognition Arousal/Alertness: Awake/alert Behavior During Therapy: WFL for tasks assessed/performed Overall Cognitive Status: Within Functional Limits for tasks assessed                                        General Comments General comments (skin integrity, edema,  etc.): Patient educated on NWB and plan to use BSC now instead of walking to bathroom; also agreeable to SNF level care as daughter and son in law work; relates SOB with ambulation, but SpO2 93% on RA    Exercises     Assessment/Plan    PT Assessment Patient needs continued PT services  PT Problem List Decreased mobility;Decreased knowledge of use of DME;Decreased knowledge of precautions;Decreased balance;Decreased activity tolerance       PT Treatment Interventions DME instruction;Gait training;Functional mobility training;Balance training;Patient/family education;Therapeutic exercise;Therapeutic activities    PT Goals (Current goals can be found in the Care Plan section)  Acute Rehab PT Goals Patient Stated Goal: To go to rehab PT Goal Formulation: With patient Time For Goal Achievement: 09/06/16 Potential to Achieve Goals: Good    Frequency Min 3X/week   Barriers to discharge Decreased caregiver support      Co-evaluation               AM-PAC PT "6 Clicks" Daily Activity  Outcome Measure Difficulty turning over in bed (including adjusting bedclothes, sheets and blankets)?: None Difficulty moving from lying on back to sitting on the side of the bed? : None Difficulty sitting down on and standing up from a chair with arms (e.g., wheelchair, bedside commode, etc,.)?: A Little Help needed moving to and from a bed to chair (including a wheelchair)?: A Little Help needed walking in hospital room?: A Little Help needed climbing 3-5 steps with a railing? : Total 6 Click Score: 18    End of Session Equipment Utilized During Treatment: Gait belt Activity Tolerance: Patient tolerated treatment well Patient left: in chair;with call bell/phone within reach Nurse Communication: Mobility status PT Visit Diagnosis: Difficulty in walking, not elsewhere classified (R26.2);History of falling (Z91.81)    Time: [id number] PT Time Calculation (min) (ACUTE ONLY): 30  min   Charges:   PT Evaluation $PT Eval Moderate Complexity: 1 Procedure PT Treatments $Gait Training: 8-22 mins   PT G CodesSheran Lawless:        Cyndi Chaeli Judy, [place]PT [phone] 08/23/2016   Elray Mcgregorynthia Tifany Hirsch 08/23/2016, 2:56 PM

## 2016-08-23 NOTE — Clinical Social Work Note (Signed)
Clinical Social Work Assessment  Patient Details  Name: Joshua Dalton MRN: [id number] Date of Birth: 06/14/1940  Date of referral:  08/23/16               Reason for consult:  Facility Placement, Discharge Planning                Permission sought to share information with:  Chartered certified accountant granted to share information::  Yes, Verbal Permission Granted  Name::        Agency::  SNF's  Relationship::     Contact Information:     Housing/Transportation Living arrangements for the past 2 months:  Single Family Home Source of Information:  Patient, Medical Team Patient Interpreter Needed:  None Criminal Activity/Legal Involvement Pertinent to Current Situation/Hospitalization:  No - Comment as needed Significant Relationships:  Adult Children Lives with:  Adult Children Do you feel safe going back to the place where you live?  Yes Need for family participation in patient care:  Yes (Comment)  Care giving concerns:  PT recommending SNF once medically stable for discharge.   Social Worker assessment / plan:  CSW met with patient. No supports at bedside. CSW introduced role and explained that PT recommendations would be discussed. Patient agreeable to SNF although he said he'd rather have his "freedom." CSW tried to provide encouragement, stating that going to rehab is a step towards that. Patient agreed. Patient is from [place], [place] but is staying in [place] with his daughter, her husband, and their 12 and 85 year old children. Patient is home alone during the day while the family is at work or school. Patient will discuss SNF with his daughter. CSW provided patient with SNF list to review with her. Patient reported being in a SNF following his knee surgery about 15 months ago. No further concerns. CSW encouraged patient to contact CSW as needed. CSW will continue to follow patient for support and facilitate discharge to SNF once medically  stable.  Employment status:  Retired Forensic scientist:  Medicare PT Recommendations:  [place] / Referral to community resources:  [place]  Patient/Family's Response to care:  Patient agreeable to SNF placement. Patient's daughter supportive and involved in patient's care. Patient appreciated social work intervention.  Patient/Family's Understanding of and Emotional Response to Diagnosis, Current Treatment, and Prognosis:  Patient has a good understanding of the reason for admission and his need for rehab prior to returning home. Patient appears happy with hospital care.  Emotional Assessment Appearance:  Appears stated age Attitude/Demeanor/Rapport:  Other (Pleasant) Affect (typically observed):  Accepting, Appropriate, Calm, Pleasant Orientation:  Oriented to Self, Oriented to Place, Oriented to  Time, Oriented to Situation Alcohol / Substance use:  Never Used Psych involvement (Current and /or in the community):  No (Comment)  Discharge Needs  Concerns to be addressed:  Care Coordination Readmission within the last 30 days:  No Current discharge risk:  Dependent with Mobility Barriers to Discharge:  Continued Medical Work up   Candie Chroman, LCSW 08/23/2016, 3:31 PM

## 2016-08-23 NOTE — Progress Notes (Signed)
[facility] Progress Note  Joshua Dalton 76 y.o. 07/26/1940  CC:  Dysphagia   Subjective: Patient continues to have shortness of breath. He also continues to dysphagia. Denied any diarrhea or constipation.  ROS : Negative for chest pain. Positive for difficulty breathing. Positive for cough.   Objective: Vital signs in last 24 hours: Vitals:   08/22/16 2202 08/23/16 0641  BP: 119/61 121/65  Pulse: 74 77  Resp: 20 20  Temp: 98.4 F (36.9 C) 98.7 F (37.1 C)    Physical Exam:  Constitutional: He is oriented to person, place, and time. He appears well-developed and well-nourished. No distress.  HENT:  Head: Normocephalic and atraumatic.  Nose: Nose normal.  Eyes: EOM are normal. Pupils are equal, round, and reactive to light.  Neck: Normal range of motion. No tracheal deviation present.  Cardiovascular: Normal rate, regular rhythm and normal heart sounds.   Pulmonary/Chest: Effort normal. He has no wheezes.  Decreased breath sounds bilaterally with left basilar fine crackles. No respiratory distress.  good situation currently on room air.  Abdominal: Soft. Bowel sounds are normal. He exhibits distension. He exhibits no mass. There is no tenderness. There is no rebound and no guarding.  Neurological: He is alert and oriented to person, place, and time.  Psychiatric: He has a normal mood and affect. His behavior is normal. Thought content normal.   Lab Results:  Recent Labs  08/22/16 0510 08/23/16 0510  NA 139 135  K 3.9 4.0  CL 104 104  CO2 25 22  GLUCOSE 99 180*  BUN 34* 31*  CREATININE 2.25* 2.23*  CALCIUM 9.2 9.4   No results for input(s): AST, ALT, ALKPHOS, BILITOT, PROT, ALBUMIN in the last 72 hours.  Recent Labs  08/22/16 0510 08/23/16 0510  WBC 10.2 12.2*  HGB 14.6 15.5  HCT 44.2 44.8  MCV 97.4 97.0  PLT 247 246   No results for input(s): LABPROT, INR in the last 72 hours.    Assessment/Plan: - Dysphagia to both solids and  liquids. Barium swallow showed no stricture or mass but 13 mm barium pill fail to pass GE junction.Patient today complaining of dysphagia with any kind of food. 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 Last dose this morning at 0030 - Chronic kidney disease.  Recommendations ------------------------- -  Last dose of Pradaxa this morning at 0030. Discussed with the pharmacist. Given his chronic kidney insufficiency Pradaxa  needs to be on hold for at least 3 days prior to any therapeutic endoscopic intervention such as dilatation. Unfortunately, he received his Pradaxa this morning, so we will not be able to perform endoscopic dilatation until Saturday morning.  - Case discussed with the admitting team. Pradaxa is on hold now.   - Offered to do outpatient endoscopy on  Monday. According to patient he does not feel well and he wants to have EGD  done while he is in the hospital. soft diet for now .  - GI will follow   Kathi DerParag Stone Spirito MD, FACP 08/23/2016, 9:58 AM  Pager [phone]  If no answer or after 5 PM call [phone]

## 2016-08-23 NOTE — Clinical Social Work Placement (Signed)
   CLINICAL SOCIAL WORK PLACEMENT  NOTE  Date:  08/23/2016  Patient Details  Name: Isabella BowensRaymond Walter Trammel MRN: [id number] Date of Birth: 12/24/1940  Clinical Social Work is seeking post-discharge placement for this patient at the Skilled  Nursing Facility level of care (*CSW will initial, date and re-position this form in  chart as items are completed):  Yes   Patient/family provided with [facility] Clinical Social Work Department's list of facilities offering this level of care within the geographic area requested by the patient (or if unable, by the patient's family).  Yes   Patient/family informed of their freedom to choose among providers that offer the needed level of care, that participate in Medicare, Medicaid or managed care program needed by the patient, have an available bed and are willing to accept the patient.  Yes   Patient/family informed of [facility]'s ownership interest in [facility] and [facility], as well as of the fact that they are under no obligation to receive care at these facilities.  PASRR submitted to EDS on 08/23/16     PASRR number received on       Existing PASRR number confirmed on 08/23/16     FL2 transmitted to all facilities in geographic area requested by pt/family on 08/23/16     FL2 transmitted to all facilities within larger geographic area on       Patient informed that his/her managed care company has contracts with or will negotiate with certain facilities, including the following:            Patient/family informed of bed offers received.  Patient chooses bed at       Physician recommends and patient chooses bed at      Patient to be transferred to   on  .  Patient to be transferred to facility by       Patient family notified on   of transfer.  Name of family member notified:        PHYSICIAN Please sign FL2     Additional Comment:    _______________________________________________ Margarito LinerSarah C Oni Dietzman,  LCSW 08/23/2016, 3:38 PM

## 2016-08-23 NOTE — Progress Notes (Signed)
Pt daughter approached desk stating that her father fell out of the chair.  Pt VSS, no complaints of pain, pt denies hitting head. Pt states that he was trying to go to the bathroom and could not get leg rest down on the chair. Pt says he slid out of the front of the chair. Paged MD to make aware. Pt has skin tear on L shin, MD orders to dress skin tear. Will monitor.

## 2016-08-23 NOTE — Progress Notes (Signed)
Inpatient Diabetes Program Recommendations  AACE/ADA: New Consensus Statement on Inpatient Glycemic Control (2015)  Target Ranges:  Prepandial:   less than 140 mg/dL      Peak postprandial:   less than 180 mg/dL (1-2 hours)      Critically ill patients:  140 - 180 mg/dL   Results for Joshua Dalton, Joshua Dalton (MRN [id number]) as of 08/23/2016 09:58  Ref. Range 08/22/2016 07:27 08/22/2016 12:26 08/22/2016 16:37 08/22/2016 20:00 08/23/2016 00:29 08/23/2016 06:06 08/23/2016 07:45  Glucose-Capillary Latest Ref Range: 65 - 99 mg/dL 295117 (H) 621205 (H) 308288 (H) 287 (H) 226 (H) 178 (H) 182 (H)   Review of Glycemic Control  Diabetes history: DM2 hx Outpatient Diabetes medications: Lantus 70 units QHS, Novolog 20 units TID with meals Current orders for Inpatient glycemic control: Lantus 40 units QHS, Novolog 0-15 units Q4H  Inpatient Diabetes Program Recommendations: Correction (SSI): Since patient is eating, please consider changing frequency of CBGs and Novolog correction to ACHS. Insulin - Meal Coverage: Post prandial glucose is consistently elevated. Please consider ordering Novolog 5 units TID with meals for meal coverage if patient eats at least 50% of meals.  Thanks, Orlando PennerMarie Syreeta Figler, RN, MSN, CDE Diabetes Coordinator Inpatient Diabetes Program [phone] (Team Pager from 8am to 5pm)

## 2016-08-23 NOTE — Progress Notes (Addendum)
PROGRESS NOTE    Joshua Dalton  NWG:[id number] DOB: Sep 25, 1940 DOA: 08/20/2016 PCP: System, Pcp Not In   Outpatient Specialists:     Brief Narrative:  Joshua Dalton is a 76 y.o. male with medical history significant of DM2, HTN, OSA, CKD.  Patient presents to the ED with c/o SOB.  Patient was initially evaluated at outside hospital when symptoms onset 3 weeks ago and had a negative stress test performed. Patient states his symptoms have worsened over that time. 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 Patient had recent trip to [place] 2 weeks ago in which he fell and broke his right ankle. Patient flew back to [facility] to be with his daughter as ankle is being taken care of. Patient is originally from [place].     Assessment & Plan:   Principal Problem:   Dyspnea Active Problems:   DM2 (diabetes mellitus, type 2) (HCC)   HTN (hypertension)   CKD (chronic kidney disease)   Dyspnea - had been on-going for several months (no hypoxia) VQ scan normal Has gained 30 lbs in last few months?- daughter says patient is not very active, even before breaking his foot BNP normal RBBB noted, no prior available for comparision 2d echo: normal EF- grade 1 diastolic dsfxn Holding pradaxa-- spoke with PCP in [place] (fax #: [phone]) who states patient was on pradaxa for a DVT in 2016-- ok to hold for a few days -outpatient PFTs recommended  Dysphagia -SLP eval -esophagus dg: probable stricture in Lower esophagus -EGD tentatively  CKD - unknown baseline Resume lasix  DM2 - Lantus 40 units QHS SSI  H/o DVT -lovenox until pradaxa presume  HTN - stable  obesity Body mass index is 39.91 kg/m.   OSA -cpap at night  DVT prophylaxis:  Fully anticoagulated   Code Status: Full Code   Family Communication: Called daughter 5/23  Disposition Plan:  Suspect will need SNF-- PT eval   Consultants:   GI  Subjective: Says a pill got  "caught" in his esophagus and made him vomit  Objective: Vitals:   08/22/16 2202 08/23/16 0638 08/23/16 0641 08/23/16 1224  BP: 119/61  121/65 (!) 103/50  Pulse: 74  77 72  Resp: 20  20 18   Temp: 98.4 F (36.9 C)  98.7 F (37.1 C) 97.8 F (36.6 C)  TempSrc: Oral  Oral Oral  SpO2: 97%  96% 97%  Weight:  113.9 kg (251 lb) 113.9 kg (251 lb)   Height:        Intake/Output Summary (Last 24 hours) at 08/23/16 1312 Last data filed at 08/23/16 0900  Gross per 24 hour  Intake              700 ml  Output              150 ml  Net              550 ml   Filed Weights   08/20/16 1758 08/23/16 0638 08/23/16 0641  Weight: 113.4 kg (250 lb) 113.9 kg (251 lb) 113.9 kg (251 lb)    Examination:  General exam: Appears calm and comfortable - hard of hearing- no increased work of breathing Respiratory system: clear and diminished Cardiovascular system: RRR Gastrointestinal system: +BS, large round abdomen Central nervous system: A+Ox3, NAD-- hard of hearing Skin: No rashes, lesions or ulcers Psychiatry: Judgement and insight appear normal. Mood & affect appropriate.     Data Reviewed: I have personally reviewed  following labs and imaging studies  CBC:  Recent Labs Lab 08/20/16 1937 08/21/16 0700 08/22/16 0510 08/23/16 0510  WBC 11.6* 10.1 10.2 12.2*  HGB 15.7 15.1 14.6 15.5  HCT 46.4 44.5 44.2 44.8  MCV 96.7 97.8 97.4 97.0  PLT 245 235 247 246   Basic Metabolic Panel:  Recent Labs Lab 08/20/16 1937 08/21/16 0442 08/22/16 0510 08/23/16 0510  NA 135 138 139 135  K 4.4 3.6 3.9 4.0  CL 103 105 104 104  CO2 21* 25 25 22   GLUCOSE 298* 216* 99 180*  BUN 36* 37* 34* 31*  CREATININE 2.31* 2.47* 2.25* 2.23*  CALCIUM 10.0 9.4 9.2 9.4   GFR: Estimated Creatinine Clearance: 33.2 mL/min (A) (by C-G formula based on SCr of 2.23 mg/dL (H)). Liver Function Tests: No results for input(s): AST, ALT, ALKPHOS, BILITOT, PROT, ALBUMIN in the last 168 hours. No results for input(s):  LIPASE, AMYLASE in the last 168 hours. No results for input(s): AMMONIA in the last 168 hours. Coagulation Profile: No results for input(s): INR, PROTIME in the last 168 hours. Cardiac Enzymes: No results for input(s): CKTOTAL, CKMB, CKMBINDEX, TROPONINI in the last 168 hours. BNP (last 3 results) No results for input(s): PROBNP in the last 8760 hours. HbA1C: No results for input(s): HGBA1C in the last 72 hours. CBG:  Recent Labs Lab 08/22/16 2000 08/23/16 0029 08/23/16 0606 08/23/16 0745 08/23/16 1147  GLUCAP 287* 226* 178* 182* 171*   Lipid Profile: No results for input(s): CHOL, HDL, LDLCALC, TRIG, CHOLHDL, LDLDIRECT in the last 72 hours. Thyroid Function Tests:  Recent Labs  08/21/16 0832  TSH 3.016   Anemia Panel: No results for input(s): VITAMINB12, FOLATE, FERRITIN, TIBC, IRON, RETICCTPCT in the last 72 hours. Urine analysis: No results found for: COLORURINE, APPEARANCEUR, LABSPEC, PHURINE, GLUCOSEU, HGBUR, BILIRUBINUR, KETONESUR, PROTEINUR, UROBILINOGEN, NITRITE, LEUKOCYTESUR   )No results found for this or any previous visit (from the past 240 hour(s)).    Anti-infectives    None       Radiology Studies: Dg Chest 2 View  Result Date: 08/23/2016 CLINICAL DATA:  Shortness of breath, chest pain. EXAM: CHEST  2 VIEW COMPARISON:  Radiographs of Aug 20, 2016. FINDINGS: Stable cardiomediastinal silhouette. Atherosclerosis of thoracic aorta is noted. No pneumothorax or pleural effusion is noted. No acute pulmonary disease is noted. What appears to be a radiographic marker is seen projected over the soft tissues of the right axillary region. No corresponding marker is seen on the left side. Nodular density seen in right lung on prior exam is not visualized currently. Bony thorax is unremarkable. IMPRESSION: No active cardiopulmonary disease. Aortic atherosclerosis. Nodular density seen in right lung on prior exam is not visualized currently. Electronically Signed   By:  Lupita RaiderJames  Green Jr, M.D.   On: 08/23/2016 09:38   Dg Esophagus  Result Date: 08/22/2016 CLINICAL DATA:  Difficulty swallowing. EXAM: ESOPHOGRAM / BARIUM SWALLOW / BARIUM TABLET STUDY TECHNIQUE: Single contrast examination performed using thick barium liquid and thin barium liquid. The patient was observed with fluoroscopy swallowing a 13 mm barium sulphate tablet. FLUOROSCOPY TIME:  Fluoroscopy Time:  3 minutes 18 seconds Radiation Exposure Index (if provided by the fluoroscopic device): 65.2 Number of Acquired Spot Images: 7 COMPARISON:  None. FINDINGS: Limited exam due to patient immobility. The esophagus is patulous. There is no stricture or mass within the mid esophagus or distal esophagus. No mucosal irregularity identified. 13 mm barium tablet failed the passed the GE junction. Attempts to advanced tablet with  water and thin barium as well as LEFT -sided down patient positioning. Patient could not be placed up RIGHT due to ankle fracture. IMPRESSION: 1. A 13 mm barium fail to pass the GE junction. This may be in part due to limited patient positioning (patient could not be imaged upright); however cannot exclude a distal esophageal stricture. Consider endoscopy for further evaluation. 2. 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 Electronically Signed   By: Genevive Bi M.D.   On: 08/22/2016 13:49   Nm Pulmonary Perf And Vent  Result Date: 08/21/2016 CLINICAL DATA:  Shortness of breath today. EXAM: NUCLEAR MEDICINE VENTILATION - PERFUSION LUNG SCAN TECHNIQUE: Ventilation images were obtained in multiple projections using inhaled aerosol Tc-61m DTPA. Perfusion images were obtained in multiple projections after intravenous injection of Tc-71m MAA. RADIOPHARMACEUTICALS:  31.5 mCi Technetium-72m DTPA aerosol inhalation and 4.4 mCi Technetium-52m MAA IV COMPARISON:  Chest x-ray 08/20/2016 FINDINGS: Ventilation: Normal ventilation study. Perfusion: Normal perfusion study. IMPRESSION: Negative V/Q scan for  pulmonary embolism. Electronically Signed   By: Rudie Meyer M.D.   On: 08/21/2016 15:30        Scheduled Meds: . allopurinol  300 mg Oral Daily  . enoxaparin (LOVENOX) injection  40 mg Subcutaneous Q24H  . fenofibrate  160 mg Oral Daily  . finasteride  5 mg Oral Daily  . FLUoxetine  20 mg Oral Daily  . furosemide  40 mg Oral Daily  . insulin aspart  0-15 Units Subcutaneous Q4H  . insulin glargine  40 Units Subcutaneous QHS  . loratadine  10 mg Oral Daily  . oxybutynin  10 mg Oral QHS  . pantoprazole  40 mg Oral BID  . pravastatin  80 mg Oral q1800  . sodium chloride flush  3 mL Intravenous Q12H  . tamsulosin  0.4 mg Oral Daily   Continuous Infusions:    LOS: 2 days    Time spent: 25 min    Kenzly Rogoff U Jazyah Butsch, DO [facility] Pager [phone]  If 7PM-7AM, please contact night-coverage [email or web address] Password [email or web address] 08/23/2016, 1:12 PM

## 2016-08-23 NOTE — [place] FL2 (Signed)
[place] MEDICAID FL2 LEVEL OF CARE SCREENING TOOL     IDENTIFICATION  Patient Name: Joshua Dalton Birthdate: 17-Nov-1940 Sex: male Admission Date (Current Location): 08/20/2016  [place] and [place] Number:   (From [facility] but staying with his daughter in [place])   Facility and Address:  [facility], [address] [place], [place] [address]      Provider Number: [id number]  Attending Physician Name and Address:  Joseph Art, DO  Relative Name and Phone Number:       Current Level of Care: Hospital Recommended Level of Care: Skilled Nursing Facility Prior Approval Number:    Date Approved/Denied:   PASRR Number: [id number]  Discharge Plan: SNF    Current Diagnoses: Patient Active Problem List   Diagnosis Date Noted  . Dyspnea 08/20/2016  . DM2 (diabetes mellitus, type 2) (HCC) 08/20/2016  . HTN (hypertension) 08/20/2016  . CKD (chronic kidney disease) 08/20/2016    Orientation RESPIRATION BLADDER Height & Weight     Self, Time, Situation, Place  Other (Comment) (CPAP: Respiratory Rate (18), Flow rate (2).) Continent Weight: 251 lb (113.9 kg) (scale b) Height:  5' 6.5" (168.9 cm)  BEHAVIORAL SYMPTOMS/MOOD NEUROLOGICAL BOWEL NUTRITION STATUS   (None)  (None) Continent Diet (Soft: Heart healthy/carb modified)  AMBULATORY STATUS COMMUNICATION OF NEEDS Skin   Limited Assist Verbally Skin abrasions, Bruising                       Personal Care Assistance Level of Assistance              Functional Limitations Info  Sight, Hearing, Speech Sight Info: Adequate Hearing Info: Adequate Speech Info: Adequate    SPECIAL CARE FACTORS FREQUENCY  PT (By licensed PT), Blood pressure, Speech therapy     PT Frequency: 5 x week       Speech Therapy Frequency: 1 x week      Contractures Contractures Info: Not present    Additional Factors Info  Code Status, Allergies Code Status Info: Full Allergies  Info: NKDA           Current Medications (08/23/2016):  This is the current hospital active medication list Current Facility-Administered Medications  Medication Dose Route Frequency Provider Last Rate Last Dose  . albuterol (PROVENTIL) (2.5 MG/3ML) 0.083% nebulizer solution 2.5 mg  2.5 mg Nebulization Q2H PRN Marlin Canary U, DO      . allopurinol (ZYLOPRIM) tablet 300 mg  300 mg Oral Daily Lyda Perone M, DO   300 mg at 08/23/16 0943  . enoxaparin (LOVENOX) injection 40 mg  40 mg Subcutaneous Q24H Vann, Jessica U, DO   40 mg at 08/23/16 0943  . fenofibrate tablet 160 mg  160 mg Oral Daily Hillary Bow, DO   160 mg at 08/23/16 5621  . finasteride (PROSCAR) tablet 5 mg  5 mg Oral Daily Vann, Jessica U, DO   5 mg at 08/23/16 1000  . FLUoxetine (PROZAC) capsule 20 mg  20 mg Oral Daily Lyda Perone M, DO   20 mg at 08/23/16 3086  . furosemide (LASIX) tablet 40 mg  40 mg Oral Daily Marlin Canary U, DO   40 mg at 08/23/16 5784  . insulin aspart (novoLOG) injection 0-15 Units  0-15 Units Subcutaneous TID WC Vann, Jessica U, DO      . insulin aspart (novoLOG) injection 0-5 Units  0-5 Units Subcutaneous QHS Vann, Jessica U, DO      .  insulin glargine (LANTUS) injection 40 Units  40 Units Subcutaneous QHS Hillary BowGardner, Jared M, DO   40 Units at 08/23/16 0030  . loratadine (CLARITIN) tablet 10 mg  10 mg Oral Daily Lyda PeroneGardner, Jared M, DO   10 mg at 08/23/16 0943  . oxybutynin (DITROPAN-XL) 24 hr tablet 10 mg  10 mg Oral QHS Lyda PeroneGardner, Jared M, DO   10 mg at 08/23/16 0030  . pantoprazole (PROTONIX) EC tablet 40 mg  40 mg Oral BID Marlin CanaryVann, Jessica U, DO   40 mg at 08/23/16 0943  . pravastatin (PRAVACHOL) tablet 80 mg  80 mg Oral q1800 Hillary BowGardner, Jared M, DO   80 mg at 08/22/16 1732  . sodium chloride flush (NS) 0.9 % injection 3 mL  3 mL Intravenous Q12H Lyda PeroneGardner, Jared M, DO   3 mL at 08/23/16 1000  . tamsulosin (FLOMAX) capsule 0.4 mg  0.4 mg Oral Daily Julian ReilGardner, Jared M, DO   0.4 mg at 08/23/16 1000      Discharge Medications: Please see discharge summary for a list of discharge medications.  Relevant Imaging Results:  Relevant Lab Results:   Additional Information SS#: [id number]  Margarito LinerSarah C Jude Naclerio, LCSW

## 2016-08-24 LAB — CBC
HCT: 44.6 % (ref 39.0–52.0)
Hemoglobin: 15.2 g/dL (ref 13.0–17.0)
MCH: 33.3 pg (ref 26.0–34.0)
MCHC: 34.1 g/dL (ref 30.0–36.0)
MCV: 97.6 fL (ref 78.0–100.0)
Platelets: 253 10*3/uL (ref 150–400)
RBC: 4.57 MIL/uL (ref 4.22–5.81)
RDW: 13.7 % (ref 11.5–15.5)
WBC: 9.4 10*3/uL (ref 4.0–10.5)

## 2016-08-24 LAB — CBC AND DIFFERENTIAL
HEMATOCRIT: 45 % (ref 41–53)
Hemoglobin: 15.2 g/dL (ref 13.5–17.5)
PLATELETS: 253 10*3/uL (ref 150–399)
WBC: 9.4 10^3/mL

## 2016-08-24 LAB — GLUCOSE, CAPILLARY
GLUCOSE-CAPILLARY: 288 mg/dL — AB (ref 65–99)
Glucose-Capillary: 189 mg/dL — ABNORMAL HIGH (ref 65–99)
Glucose-Capillary: 238 mg/dL — ABNORMAL HIGH (ref 65–99)
Glucose-Capillary: 168 mg/dL — ABNORMAL HIGH (ref 65–99)

## 2016-08-24 MED ORDER — INSULIN ASPART 100 UNIT/ML ~~LOC~~ SOLN
5.0000 [IU] | Freq: Three times a day (TID) | SUBCUTANEOUS | Status: DC
  Administered 2016-08-24 – 2016-08-26 (×6): 5 [IU] via SUBCUTANEOUS

## 2016-08-24 NOTE — Progress Notes (Addendum)
PROGRESS NOTE    Joshua Dalton  UJW:[id number] DOB: 08/14/1940 DOA: 08/20/2016 PCP: System, Pcp Not In   Outpatient Specialists:     Brief Narrative:  Joshua Dalton is a 76 y.o. male with medical history significant of DM2, HTN, OSA, CKD.  Patient presents to the ED with c/o SOB.  Patient was initially evaluated at outside hospital when symptoms onset 3 weeks ago and had a negative stress test performed. Patient states his symptoms have worsened over that time. 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 Patient had recent trip to [facility] 2 weeks ago in which he fell and broke his right ankle. Patient flew back to [facility] to be with his daughter as ankle is being taken care of by ortho here. Patient is originally from [place]. Sob stable and then patient c/o dysphagia and pills getting stuck in throat.  Work up in progress.  Tentative plan for EGD on Saturday and possible d/c Saturday PM to SNF.  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    Assessment & Plan:   Principal Problem:   Dyspnea Active Problems:   DM2 (diabetes mellitus, type 2) (HCC)   HTN (hypertension)   CKD (chronic kidney disease)   Dyspnea - had been on-going for several months (no hypoxia) VQ scan normal Has gained 30 lbs in last few months?- daughter says patient is not very active, even before breaking his foot BNP normal RBBB noted, no prior available for comparision 2d echo: normal EF- grade 1 diastolic dsfxn Holding pradaxa-- spoke with PCP in [place] (fax #: [phone]) who states patient was on pradaxa for a DVT in 2016-- ok to hold for a few days for procedure -outpatient PFTs recommended  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   OSA -cpap at night  DVT prophylaxis:  Fully anticoagulated   Code Status: Full Code   Family Communication: Called daughter 5/23  Disposition Plan:  SNF   Consultants:  cards GI  Subjective: Unable to eat eggs this AM without getting caught in his throat  Objective: Vitals:   08/23/16 1224 08/23/16 1600 08/23/16 2001 08/24/16 0504  BP: (!) 103/50 (!) 118/58 129/74 121/63  Pulse: 72 87 90 77  Resp: 18 18 20 20   Temp: 97.8 F (36.6 C) 98.2 F (36.8 C) 98.6 F (37 C) 98.8 F (37.1 C)  TempSrc: Oral Oral Oral Oral  SpO2: 97% 95% 100% 93%  Weight:    112.7 kg (248 lb 6.4 oz)  Height:        Intake/Output Summary (Last 24 hours) at 08/24/16 1104 Last data filed at 08/24/16 1015  Gross per 24 hour  Intake              700 ml  Output             1200 ml  Net             -500 ml   Filed Weights   08/23/16 0638 08/23/16 0641 08/24/16 0504  Weight: 113.9 kg (251 lb) 113.9 kg (251 lb) 112.7 kg (248 lb 6.4 oz)    Examination:  General exam: A+Ox3,  NAD- has hearing aides Respiratory system: no wheezing, clear but diminished Cardiovascular system: RRR Gastrointestinal system: +BS, large round abdomen Central nervous system: A+Ox3, NAD-- hard of hearing Skin: No rashes, lesions or ulcers-- right foot in cast     Data Reviewed: I have personally reviewed following labs and imaging studies  CBC:  Recent Labs Lab 08/20/16 1937 08/21/16 0700 08/22/16 0510 08/23/16 0510 08/24/16 0309  WBC 11.6* 10.1 10.2 12.2* 9.4  HGB 15.7 15.1 14.6 15.5 15.2  HCT 46.4 44.5 44.2 44.8 44.6  MCV 96.7 97.8 97.4 97.0 97.6  PLT 245 235 247 246 253   Basic Metabolic Panel:  Recent Labs Lab 08/20/16 1937 08/21/16 0442 08/22/16 0510 08/23/16 0510  NA 135 138 139 135  K 4.4 3.6 3.9 4.0  CL 103 105 104 104  CO2 21* 25 25 22   GLUCOSE 298* 216* 99 180*  BUN  36* 37* 34* 31*  CREATININE 2.31* 2.47* 2.25* 2.23*  CALCIUM 10.0 9.4 9.2 9.4   GFR: Estimated Creatinine Clearance: 33 mL/min (A) (by C-G formula based on SCr of 2.23 mg/dL (H)). Liver Function Tests: No results for input(s): AST, ALT, ALKPHOS, BILITOT, PROT, ALBUMIN in the last 168 hours. No results for input(s): LIPASE, AMYLASE in the last 168 hours. No results for input(s): AMMONIA in the last 168 hours. Coagulation Profile: No results for input(s): INR, PROTIME in the last 168 hours. Cardiac Enzymes: No results for input(s): CKTOTAL, CKMB, CKMBINDEX, TROPONINI in the last 168 hours. BNP (last 3 results) No results for input(s): PROBNP in the last 8760 hours. HbA1C: No results for input(s): HGBA1C in the last 72 hours. CBG:  Recent Labs Lab 08/23/16 0745 08/23/16 1147 08/23/16 1556 08/23/16 2001 08/24/16 0740  GLUCAP 182* 171* 277* 301* 189*   Lipid Profile: No results for input(s): CHOL, HDL, LDLCALC, TRIG, CHOLHDL, LDLDIRECT in the last 72 hours. Thyroid Function Tests: No results for input(s): TSH, T4TOTAL, FREET4, T3FREE, THYROIDAB in the last 72 hours. Anemia Panel: No results for input(s): VITAMINB12, FOLATE, FERRITIN, TIBC, IRON, RETICCTPCT in the last 72 hours. Urine analysis: No results found for: COLORURINE, APPEARANCEUR, LABSPEC, PHURINE, GLUCOSEU, HGBUR, BILIRUBINUR, KETONESUR, PROTEINUR, UROBILINOGEN, NITRITE, LEUKOCYTESUR   )No results found for this or any previous visit (from the past 240 hour(s)).    Anti-infectives    None       Radiology Studies: Dg Chest 2 View  Result Date: 08/23/2016 CLINICAL DATA:  Shortness of breath, chest pain. EXAM: CHEST  2 VIEW COMPARISON:  Radiographs of Aug 20, 2016. FINDINGS: Stable cardiomediastinal silhouette. Atherosclerosis of thoracic aorta is noted. No pneumothorax or pleural effusion is noted. No acute pulmonary disease is noted. What appears to be a radiographic marker is seen projected over the soft  tissues of the right axillary region. No corresponding marker is seen on the left side. Nodular density seen in right lung on prior exam is not visualized currently. Bony thorax is unremarkable. IMPRESSION: No active cardiopulmonary disease. Aortic atherosclerosis. Nodular density seen in right lung on prior exam is not visualized currently. Electronically Signed   By: Lupita Raider, M.D.   On: 08/23/2016 09:38   Dg Esophagus  Result Date: 08/22/2016 CLINICAL DATA:  Difficulty swallowing. EXAM: ESOPHOGRAM / BARIUM SWALLOW / BARIUM TABLET STUDY TECHNIQUE: Single contrast examination performed using thick barium liquid and thin barium liquid. The patient was observed with fluoroscopy swallowing a 13 mm barium sulphate tablet. FLUOROSCOPY TIME:  Fluoroscopy Time:  3 minutes 18 seconds Radiation Exposure Index (if  provided by the fluoroscopic device): 65.2 Number of Acquired Spot Images: 7 COMPARISON:  None. FINDINGS: Limited exam due to patient immobility. The esophagus is patulous. There is no stricture or mass within the mid esophagus or distal esophagus. No mucosal irregularity identified. 13 mm barium tablet failed the passed the GE junction. Attempts to advanced tablet with water and thin barium as well as LEFT -sided down patient positioning. Patient could not be placed up RIGHT due to ankle fracture. IMPRESSION: 1. A 13 mm barium fail to pass the GE junction. This may be in part due to limited patient positioning (patient could not be imaged upright); however cannot exclude a distal esophageal stricture. Consider endoscopy for further evaluation. 2. 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 Electronically Signed   By: Genevive BiStewart  Edmunds M.D.   On: 08/22/2016 13:49        Scheduled Meds: . allopurinol  300 mg Oral Daily  . enoxaparin (LOVENOX) injection  40 mg Subcutaneous Q24H  . fenofibrate  160 mg Oral Daily  . finasteride  5 mg Oral Daily  . FLUoxetine  20 mg Oral Daily  . furosemide  40 mg  Oral Daily  . insulin aspart  0-15 Units Subcutaneous TID WC  . insulin aspart  0-5 Units Subcutaneous QHS  . insulin glargine  40 Units Subcutaneous QHS  . loratadine  10 mg Oral Daily  . oxybutynin  10 mg Oral QHS  . pantoprazole  40 mg Oral BID  . pravastatin  80 mg Oral q1800  . sodium chloride flush  3 mL Intravenous Q12H  . tamsulosin  0.4 mg Oral Daily   Continuous Infusions:    LOS: 3 days    Time spent: 25 min    JESSICA U VANN, DO [facility] Pager [phone]  If 7PM-7AM, please contact night-coverage [email or web address] Password [email or web address] 08/24/2016, 11:04 AM

## 2016-08-24 NOTE — Progress Notes (Signed)
Inpatient Diabetes Program Recommendations  AACE/ADA: New Consensus Statement on Inpatient Glycemic Control (2015)  Target Ranges:  Prepandial:   less than 140 mg/dL      Peak postprandial:   less than 180 mg/dL (1-2 hours)      Critically ill patients:  140 - 180 mg/dL   Results for Joshua Dalton, Joshua Dalton (MRN [id number]) as of 08/24/2016 09:02  Ref. Range 08/23/2016 07:45 08/23/2016 11:47 08/23/2016 15:56 08/23/2016 20:01 08/24/2016 07:40  Glucose-Capillary Latest Ref Range: 65 - 99 mg/dL 308182 (H) 657171 (H) 846277 (H) 301 (H) 189 (H)  Results for Joshua Dalton, Joshua Dalton (MRN [id number]) as of 08/24/2016 09:02  Ref. Range 08/22/2016 07:27 08/22/2016 12:26 08/22/2016 16:37 08/22/2016 20:00 08/23/2016 00:29  Glucose-Capillary Latest Ref Range: 65 - 99 mg/dL 324117 (H) 401205 (H) 027288 (H) 287 (H) 226 (H)   Review of Glycemic Control  Diabetes history: DM2 Outpatient Diabetes medications: Lantus 70 units QHS, Novolog 20 units TID with meals Current orders for Inpatient glycemic control: Lantus 40 units QHS, Novolog 0-15 units TID with meals, Novolog 0-5 units QHS  Inpatient Diabetes Program Recommendations: Insulin - Meal Coverage: Post prandial glucose is consistently elevated. Please consider ordering Novolog 5 units TID with meals for meal coverage if patient eats at least 50% of meals.  Thanks, Orlando PennerMarie Treena Cosman, RN, MSN, CDE Diabetes Coordinator Inpatient Diabetes Program [phone] (Team Pager from 8am to 5pm)

## 2016-08-24 NOTE — Clinical Social Work Note (Addendum)
CSW provided patient with bed offers. [facility] inquired about [facility] [address] [facility]. CSW explained that they rarely have beds for outside referrals as ALF/ILF residents get priority. CSW left voicemail for admissions coordinator to see if they had any beds available. CSW encouraged patient to discuss current bed offers with his daughter and choose a second choice. CSW provided contact card.  Charlynn CourtSarah Dietra Stokely, CSW [phone]  2:38 pm Neither of the [facility] facilities are able to accept patient.  Charlynn CourtSarah Rondarius Kadrmas, CSW [phone]

## 2016-08-24 NOTE — Progress Notes (Signed)
[facility] Progress Note  Joshua BowensRaymond Walter Dalton 76 y.o. 08/09/1940  CC:  Dysphagia   Subjective: Patient had a small incidence of all this morning. Appears to be stable at this point. Tolerating breakfast past this morning without any vomiting. Denied diarrhea or blood in the stool.  ROS : Negative for chest pain. Positive for difficulty breathing. Positive for cough.   Objective: Vital signs in last 24 hours: Vitals:   08/23/16 2001 08/24/16 0504  BP: 129/74 121/63  Pulse: 90 77  Resp: 20 20  Temp: 98.6 F (37 C) 98.8 F (37.1 C)    Physical Exam:  Constitutional: He is oriented to person, place, and time. He appears well-developed and well-nourished. No distress.  HENT:  Head: Normocephalic and atraumatic.  Nose: Nose normal.  Eyes: EOM are normal. Pupils are equal, round, and reactive to light.  Neck: Normal range of motion. No tracheal deviation present.  Cardiovascular: Normal rate, regular rhythm and normal heart sounds.   Pulmonary/Chest: Effort normal. He has no wheezes.  Decreased breath sounds bilaterally with left basilar fine crackles. No respiratory distress.  good situation currently on room air.  Abdominal: Soft. Bowel sounds are normal. He exhibits distension. He exhibits no mass. There is no tenderness. There is no rebound and no guarding.  Neurological: He is alert and oriented to person, place, and time.  Psychiatric: He has a normal mood and affect. His behavior is normal. Thought content normal.   Lab Results:  Recent Labs  08/22/16 0510 08/23/16 0510  NA 139 135  K 3.9 4.0  CL 104 104  CO2 25 22  GLUCOSE 99 180*  BUN 34* 31*  CREATININE 2.25* 2.23*  CALCIUM 9.2 9.4   No results for input(s): AST, ALT, ALKPHOS, BILITOT, PROT, ALBUMIN in the last 72 hours.  Recent Labs  08/23/16 0510 08/24/16 0309  WBC 12.2* 9.4  HGB 15.5 15.2  HCT 44.8 44.6  MCV 97.0 97.6  PLT 246 253   No results for input(s): LABPROT, INR in the  last 72 hours.    Assessment/Plan: - Dysphagia to both solids and liquids. Barium swallow showed no stricture or mass but 13 mm barium pill fail to pass GE junction.Patient today complaining of dysphagia with any kind of food. 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 Last dose this morning at 0030 - Chronic kidney disease.  Recommendations ------------------------- -  Last dose of Pradaxa yesterday(05/23)  morning at 0030. Discussed with the pharmacist. Given his chronic kidney insufficiency Pradaxa  needs to be on hold for at least 3 days prior to any therapeutic endoscopic intervention such as dilatation. so we will not be able to perform endoscopic dilatation until Saturday morning.  - Case discussed with the admitting team. Pradaxa is on hold now. Patient is on Lovenox 40 mg once a day.  - Offered to do outpatient endoscopy on  Monday. According to patient he does not feel well and he wants to have EGD  done while he is in the hospital. soft diet for now . Tentatively plan for EGD on Saturday morning. Hold Lovenox for Saturday morning. Last dose should be on Friday morning. Keep him on soft diet for now  - GI will follow   Kathi DerParag Welles Walthall MD, FACP 08/24/2016, 9:38 AM  Pager [phone]  If no answer or after 5 PM call [phone]

## 2016-08-24 NOTE — Progress Notes (Signed)
Pharmacy note: lovenox  76 yo male on prophylactic lovenox and plans for EGD on Saturday morning. He is noted on Pradaxa PTA for history of DVT  Plan -Per GI hold lovenox after Fridays dose -Orders have been adjusted -Will follow plans for anticoagulation post procedure  Harland Germanndrew Shanan Mcmiller, Pharm D 08/24/2016 12:12 PM

## 2016-08-24 NOTE — Progress Notes (Signed)
08/24/2016- Respiratory care note- Pt placing self on CPAP machine and tolerating well.

## 2016-08-25 DIAGNOSIS — R55 Syncope and collapse: Secondary | ICD-10-CM

## 2016-08-25 DIAGNOSIS — Z09 Encounter for follow-up examination after completed treatment for conditions other than malignant neoplasm: Secondary | ICD-10-CM

## 2016-08-25 DIAGNOSIS — I441 Atrioventricular block, second degree: Secondary | ICD-10-CM

## 2016-08-25 DIAGNOSIS — R1310 Dysphagia, unspecified: Secondary | ICD-10-CM

## 2016-08-25 LAB — BASIC METABOLIC PANEL
Anion gap: 9 (ref 5–15)
BUN: 29 mg/dL — AB (ref 4–21)
BUN: 29 mg/dL — ABNORMAL HIGH (ref 6–20)
CHLORIDE: 106 mmol/L (ref 101–111)
CO2: 23 mmol/L (ref 22–32)
Calcium: 9.5 mg/dL (ref 8.9–10.3)
Creatinine, Ser: 2.23 mg/dL — ABNORMAL HIGH (ref 0.61–1.24)
Creatinine: 2.2 mg/dL — AB (ref 0.6–1.3)
GFR calc Af Amer: 31 mL/min — ABNORMAL LOW (ref >60)
GFR calc non Af Amer: 27 mL/min — ABNORMAL LOW (ref >60)
GLUCOSE: 120 mg/dL — AB (ref 65–99)
Glucose: 120 mg/dL
Potassium: 3.8 mmol/L (ref 3.4–5.3)
Potassium: 3.8 mmol/L (ref 3.5–5.1)
SODIUM: 138 mmol/L (ref 135–145)
Sodium: 138 mmol/L (ref 137–147)

## 2016-08-25 LAB — CBC AND DIFFERENTIAL
HCT: 46 % (ref 41–53)
Hemoglobin: 15.4 g/dL (ref 13.5–17.5)
Platelets: 254 10*3/uL (ref 150–399)
WBC: 10.1 10^3/mL

## 2016-08-25 LAB — GLUCOSE, CAPILLARY
GLUCOSE-CAPILLARY: 298 mg/dL — AB (ref 65–99)
GLUCOSE-CAPILLARY: 236 mg/dL — AB (ref 65–99)
Glucose-Capillary: 127 mg/dL — ABNORMAL HIGH (ref 65–99)
Glucose-Capillary: 192 mg/dL — ABNORMAL HIGH (ref 65–99)

## 2016-08-25 LAB — CBC
HCT: 45.7 % (ref 39.0–52.0)
HEMOGLOBIN: 15.4 g/dL (ref 13.0–17.0)
MCH: 32.6 pg (ref 26.0–34.0)
MCHC: 33.7 g/dL (ref 30.0–36.0)
MCV: 96.6 fL (ref 78.0–100.0)
PLATELETS: 254 10*3/uL (ref 150–400)
RBC: 4.73 MIL/uL (ref 4.22–5.81)
RDW: 13.6 % (ref 11.5–15.5)
WBC: 10.1 10*3/uL (ref 4.0–10.5)

## 2016-08-25 MED ORDER — FUROSEMIDE 10 MG/ML IJ SOLN
40.0000 mg | Freq: Once | INTRAMUSCULAR | Status: AC
Start: 2016-08-25 — End: 2016-08-25
  Administered 2016-08-25: 40 mg via INTRAVENOUS
  Filled 2016-08-25: qty 4

## 2016-08-25 MED ORDER — SODIUM CHLORIDE 0.9 % IV SOLN
INTRAVENOUS | Status: DC
  Administered 2016-08-26 (×2): via INTRAVENOUS

## 2016-08-25 MED ORDER — POLYETHYLENE GLYCOL 3350 17 G PO PACK
17.0000 g | PACK | Freq: Every day | ORAL | Status: DC
  Administered 2016-08-25: 17 g via ORAL
  Filled 2016-08-25 (×2): qty 1

## 2016-08-25 MED ORDER — MAGNESIUM HYDROXIDE 400 MG/5ML PO SUSP
30.0000 mL | Freq: Once | ORAL | Status: AC
  Administered 2016-08-25: 30 mL via ORAL
  Filled 2016-08-25: qty 30

## 2016-08-25 NOTE — Consult Note (Signed)
ELECTROPHYSIOLOGY CONSULT NOTE    Patient ID: Joshua Dalton MRN: [id number], DOB/AGE: June 14, 1940 76 y.o.  Admit date: 08/20/2016 Date of Consult: 08/25/2016  Primary Physician: System, Pcp Not In Primary Cardiologist: [place], [place]  Reason for Consultation: nocturnal heart block  HPI:  Joshua Dalton is a 76 y.o. male is referred by Dr Benjamine Mola for evaluation of nocturnal heart block.  Past medical history is significant for diabetes, hypertension, OSA.  He is in [place] visiting his daughter and developed shortness of breath for which he presented for further evaluation.  On telemetry, he has been noted to have nocturnal heart block for which EP is asked to evaluate.  Episodes all occur while sleeping and have P-P and P-R prolongation.  He denies frank syncope, but does have conduction system disease at baseline. He also had an episode a couple of weeks ago where he awoke on the floor after using the commode. He is unsure of what happened.    Echo this admission demonstrated EF 60-65%, grade 1 diastolic dysfunction, LA 35  He currently denies chest pain, shortness of breath, LE edema, recent fevers, chills, nausea or vomiting. He does have a feeling of "food getting stuck" and is scheduled for EGD tomorrow.   Past Medical History:  Diagnosis Date  . Asthma   . CHF (congestive heart failure) (HCC)    patient not sure what kind  . Diabetes mellitus without complication (HCC)   . Heart disease   . Hypercholesteremia   . Hypertension   . Obstructive sleep apnea      Surgical History:  Past Surgical History:  Procedure Laterality Date  . CARDIAC SURGERY       Prescriptions Prior to Admission  Medication Sig Dispense Refill Last Dose  . allopurinol (ZYLOPRIM) 300 MG tablet Take 300 mg by mouth daily.   08/20/2016 at Unknown time  . dabigatran (PRADAXA) 150 MG CAPS capsule Take 150 mg by mouth 2 (two) times daily.   08/20/2016 at 1100  . fenofibrate (TRICOR) 145 MG  tablet Take 145 mg by mouth daily.   08/19/2016 at Unknown time  . fexofenadine (ALLEGRA) 180 MG tablet Take 180 mg by mouth daily.   08/20/2016 at Unknown time  . finasteride (PROSCAR) 5 MG tablet Take 5 mg by mouth daily.   08/20/2016 at Unknown time  . FLUoxetine (PROZAC) 20 MG capsule Take 20 mg by mouth daily.   08/20/2016 at Unknown time  . furosemide (LASIX) 40 MG tablet Take 40 mg by mouth daily.   08/20/2016 at Unknown time  . insulin aspart (NOVOLOG) 100 UNIT/ML injection Inject 20 Units into the skin 3 (three) times daily before meals.   08/19/2016 at Unknown time  . insulin glargine (LANTUS) 100 UNIT/ML injection Inject 70 Units into the skin at bedtime.   08/19/2016 at Unknown time  . lisinopril (PRINIVIL,ZESTRIL) 10 MG tablet Take 10 mg by mouth daily.   08/20/2016 at Unknown time  . nitroGLYCERIN (NITROSTAT) 0.4 MG SL tablet Place 0.4 mg under the tongue every 5 (five) minutes as needed for chest pain.   unknown  . oxybutynin (DITROPAN-XL) 10 MG 24 hr tablet Take 10 mg by mouth at bedtime.   08/19/2016 at Unknown time  . pravastatin (PRAVACHOL) 80 MG tablet Take 80 mg by mouth daily.   08/19/2016 at Unknown time  . tamsulosin (FLOMAX) 0.4 MG CAPS capsule Take 0.4 mg by mouth daily.    08/20/2016 at Unknown time    Inpatient Medications: .  allopurinol  300 mg Oral Daily  . fenofibrate  160 mg Oral Daily  . finasteride  5 mg Oral Daily  . FLUoxetine  20 mg Oral Daily  . furosemide  40 mg Oral Daily  . insulin aspart  0-15 Units Subcutaneous TID WC  . insulin aspart  0-5 Units Subcutaneous QHS  . insulin aspart  5 Units Subcutaneous TID WC  . insulin glargine  40 Units Subcutaneous QHS  . loratadine  10 mg Oral Daily  . oxybutynin  10 mg Oral QHS  . pantoprazole  40 mg Oral BID  . pravastatin  80 mg Oral q1800  . sodium chloride flush  3 mL Intravenous Q12H  . tamsulosin  0.4 mg Oral Daily    Allergies: No Known Allergies  Social History   Social History  . Marital status:  Widowed    Spouse name: N/A  . Number of children: N/A  . Years of education: N/A   Occupational History  . Not on file.   Social History Main Topics  . Smoking status: Former Games developer  . Smokeless tobacco: Never Used  . Alcohol use No  . Drug use: Unknown  . Sexual activity: Not on file   Other Topics Concern  . Not on file   Social History Narrative  . No narrative on file     Family History:  No premature CAD   Review of Systems: All other systems reviewed and are otherwise negative except as noted above.  Physical Exam: Vitals:   08/24/16 1212 08/24/16 1951 08/25/16 0542 08/25/16 1126  BP: 128/71 131/80 123/62 117/63  Pulse: 70 78 74 65  Resp: 20 18 18 16   Temp: 97.6 F (36.4 C) 98.4 F (36.9 C) 98.2 F (36.8 C) 98.8 F (37.1 C)  TempSrc: Oral Oral Oral Oral  SpO2: 96% 98% 96% 93%  Weight:   254 lb 4.8 oz (115.3 kg)   Height:        GEN- The patient is elderly and obese appearing, alert and oriented x 3 today.   HEENT: normocephalic, atraumatic; sclera clear, conjunctiva pink; hearing intact; oropharynx clear; neck supple  Lungs- Clear to ausculation bilaterally, normal work of breathing.  No wheezes, rales, rhonchi Heart- Regular rate and rhythm  GI- soft, non-tender, non-distended, bowel sounds present  Extremities- no clubbing, cyanosis, or edema; +cast on right leg, dressing to LLE MS- no significant deformity or atrophy Skin- warm and dry, no rash or lesion Psych- euthymic mood, full affect Neuro- strength and sensation are intact  Labs:  Lab Results  Component Value Date   WBC 10.1 08/25/2016   HGB 15.4 08/25/2016   HCT 45.7 08/25/2016   MCV 96.6 08/25/2016   PLT 254 08/25/2016    Recent Labs Lab 08/25/16 0751  NA 138  K 3.8  CL 106  CO2 23  BUN 29*  CREATININE 2.23*  CALCIUM 9.5  GLUCOSE 120*      Radiology/Studies: Dg Chest 2 View Result Date: 08/23/2016 CLINICAL DATA:  Shortness of breath, chest pain. EXAM: CHEST  2 VIEW  COMPARISON:  Radiographs of Aug 20, 2016. FINDINGS: Stable cardiomediastinal silhouette. Atherosclerosis of thoracic aorta is noted. No pneumothorax or pleural effusion is noted. No acute pulmonary disease is noted. What appears to be a radiographic marker is seen projected over the soft tissues of the right axillary region. No corresponding marker is seen on the left side. Nodular density seen in right lung on prior exam is not visualized currently. Bony thorax  is unremarkable. IMPRESSION: No active cardiopulmonary disease. Aortic atherosclerosis. Nodular density seen in right lung on prior exam is not visualized currently. Electronically Signed   By: Lupita RaiderJames  Green Jr, M.D.   On: 08/23/2016 09:38   Nm Pulmonary Perf And Vent Result Date: 08/21/2016 CLINICAL DATA:  Shortness of breath today. EXAM: NUCLEAR MEDICINE VENTILATION - PERFUSION LUNG SCAN TECHNIQUE: Ventilation images were obtained in multiple projections using inhaled aerosol Tc-413m DTPA. Perfusion images were obtained in multiple projections after intravenous injection of Tc-7913m MAA. RADIOPHARMACEUTICALS:  31.5 mCi Technetium-3913m DTPA aerosol inhalation and 4.4 mCi Technetium-8713m MAA IV COMPARISON:  Chest x-ray 08/20/2016 FINDINGS: Ventilation: Normal ventilation study. Perfusion: Normal perfusion study. IMPRESSION: Negative V/Q scan for pulmonary embolism. Electronically Signed   By: Rudie MeyerP.  Gallerani M.D.   On: 08/21/2016 15:30    RUE:[id number]:sinus rhythm, 1st degree AV block, iRBBB (personally reviewed)  TELEMETRY: sinus rhythm with occasional nocturnal bradycardia with P-P and P-R lengthening (personally reviewed)  Assessment/Plan: 1.  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 We have discussed not driving as well as [place] when [place] for evaluation of 30 day event monitor to evaluate for symptomatic bradyarrhythmias. Would keep on tele while in hospital.   Electrophysiology team to see as needed while here. Please call with questions.  Signed, Gypsy BalsamAmber Seiler, NP 08/25/2016 11:42 AM  I have seen, examined the patient, and reviewed the above assessment and plan.  On exam, RRR. Chronically ill appearing.  Changes to above are made where necessary.  Pt with asymptomatic nocturnal bradycardia.  He does have some degree of AV nodal conduction disease on ekg.  Likely vasovagal syncope on admit.  Would advise to keep on telemetry while here.  [place].  No driving until he has seen his cardiologist.  Please call with any symptomatic AV block while here in the interim.  Co Sign: Hillis RangeJames Chyna Kneece, MD 08/25/2016 2:38 PM

## 2016-08-25 NOTE — Progress Notes (Signed)
Pt puts his CPAP on when he needs it

## 2016-08-25 NOTE — Progress Notes (Signed)
PROGRESS NOTE    Joshua Dalton  BJY:[id number] DOB: 05/17/1940 DOA: 08/20/2016 PCP: System, Pcp Not In   Outpatient Specialists:   Subjective: Napping with BiPAP, clearing his throat recurrently while talking to him. Denies any fever or chills, denies any fever or chills.  Brief Narrative:  Joshua Dalton is a 76 y.o. male with medical history significant of DM2, HTN, OSA, CKD.  Patient presents to the ED with c/o SOB.  Patient was initially evaluated at outside hospital when symptoms onset 3 weeks ago and had a negative stress test performed. Patient states his symptoms have worsened over that time. 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 [place] recent [place] ago in which he fell and broke his right ankle. [place] [facility] [facility] be with his daughter as ankle is being taken care of by ortho here. [place] is [place] from [place]. Sob stable and then patient c/o dysphagia and pills getting stuck in throat.  Work up in progress.  Tentative plan for EGD on Saturday and possible d/c Saturday PM to SNF.  Stay also complicated by nocturnal 3rd degree HB  Assessment & Plan:   Principal Problem:   Dyspnea Active Problems:   DM2 (diabetes mellitus, type 2) (HCC)   HTN (hypertension)   CKD (chronic kidney disease)   Dyspnea - had been on-going for several months (no hypoxia) VQ scan normal Has gained 30 lbs in last few months?- daughter says patient is not very active, even before breaking his foot BNP normal RBBB noted, no prior available for comparison 2d echo: normal EF- grade 1 diastolic dsfxn Holding pradaxa-- spoke with PCP in [place] (fax #: [phone]) who states patient was on pradaxa for a DVT in 2016-- ok to hold for a few days for procedure. Ooutpatient PFTs recommended and if it still dyspneic might need stress testing.  Nocturnal third degree AV block -Episodes of third-degree AV block at night,  patient has OSA on CPAP. -Evaluated by EP cardiology and recommended 30 day event monitor on discharge.  Dysphagia -Esophagus dg: probable stricture in Lower esophagus -EGD tentatively for Saturday  CKD - unknown baseline Resume lasix  DM2 Lantus 40 units QHS SSI  H/o DVT -lovenox until pradaxa presume  HTN - stable  Obesity Body mass index is 40.43 kg/m.   OSA -cpap at night   DVT prophylaxis:  Fully anticoagulated   Code Status: Full Code   Family Communication: Called daughter 5/23  Disposition Plan:  SNF   Consultants:  cards GI    Objective: Vitals:   08/24/16 1212 08/24/16 1951 08/25/16 0542 08/25/16 1126  BP: 128/71 131/80 123/62 117/63  Pulse: 70 78 74 65  Resp: 20 18 18 16   Temp: 97.6 F (36.4 C) 98.4 F (36.9 C) 98.2 F (36.8 C) 98.8 F (37.1 C)  TempSrc: Oral Oral Oral Oral  SpO2: 96% 98% 96% 93%  Weight:   115.3 kg (254 lb 4.8 oz)   Height:        Intake/Output Summary (Last 24 hours) at 08/25/16 1414 Last data filed at 08/25/16 0900  Gross per 24 hour  Intake              480 ml  Output              900 ml  Net             -420 ml   Filed Weights   08/23/16  9147 08/24/16 0504 08/25/16 0542  Weight: 113.9 kg (251 lb) 112.7 kg (248 lb 6.4 oz) 115.3 kg (254 lb 4.8 oz)    Examination:  General exam: A+Ox3, NAD- has hearing aides Respiratory system: no wheezing, clear but diminished Cardiovascular system: RRR Gastrointestinal system: +BS, large round abdomen Central nervous system: A+Ox3, NAD-- hard of hearing Skin: No rashes, lesions or ulcers-- right foot in cast     Data Reviewed: I have personally reviewed following labs and imaging studies  CBC:  Recent Labs Lab 08/21/16 0700 08/22/16 0510 08/23/16 0510 08/24/16 0309 08/25/16 0311  WBC 10.1 10.2 12.2* 9.4 10.1  HGB 15.1 14.6 15.5 15.2 15.4  HCT 44.5 44.2 44.8 44.6 45.7  MCV 97.8 97.4 97.0 97.6 96.6  PLT 235 247 246 253 254   Basic Metabolic  Panel:  Recent Labs Lab 08/20/16 1937 08/21/16 0442 08/22/16 0510 08/23/16 0510 08/25/16 0751  NA 135 138 139 135 138  K 4.4 3.6 3.9 4.0 3.8  CL 103 105 104 104 106  CO2 21* 25 25 22 23   GLUCOSE 298* 216* 99 180* 120*  BUN 36* 37* 34* 31* 29*  CREATININE 2.31* 2.47* 2.25* 2.23* 2.23*  CALCIUM 10.0 9.4 9.2 9.4 9.5   GFR: Estimated Creatinine Clearance: 33.4 mL/min (A) (by C-G formula based on SCr of 2.23 mg/dL (H)). Liver Function Tests: No results for input(s): AST, ALT, ALKPHOS, BILITOT, PROT, ALBUMIN in the last 168 hours. No results for input(s): LIPASE, AMYLASE in the last 168 hours. No results for input(s): AMMONIA in the last 168 hours. Coagulation Profile: No results for input(s): INR, PROTIME in the last 168 hours. Cardiac Enzymes: No results for input(s): CKTOTAL, CKMB, CKMBINDEX, TROPONINI in the last 168 hours. BNP (last 3 results) No results for input(s): PROBNP in the last 8760 hours. HbA1C: No results for input(s): HGBA1C in the last 72 hours. CBG:  Recent Labs Lab 08/24/16 1134 08/24/16 1641 08/24/16 2106 08/25/16 0745 08/25/16 1128  GLUCAP 238* 168* 288* 127* 192*   Lipid Profile: No results for input(s): CHOL, HDL, LDLCALC, TRIG, CHOLHDL, LDLDIRECT in the last 72 hours. Thyroid Function Tests: No results for input(s): TSH, T4TOTAL, FREET4, T3FREE, THYROIDAB in the last 72 hours. Anemia Panel: No results for input(s): VITAMINB12, FOLATE, FERRITIN, TIBC, IRON, RETICCTPCT in the last 72 hours. Urine analysis: No results found for: COLORURINE, APPEARANCEUR, LABSPEC, PHURINE, GLUCOSEU, HGBUR, BILIRUBINUR, KETONESUR, PROTEINUR, UROBILINOGEN, NITRITE, LEUKOCYTESUR   )No results found for this or any previous visit (from the past 240 hour(s)).    Anti-infectives    None       Radiology Studies: No results found.      Scheduled Meds: . allopurinol  300 mg Oral Daily  . fenofibrate  160 mg Oral Daily  . finasteride  5 mg Oral Daily  .  FLUoxetine  20 mg Oral Daily  . furosemide  40 mg Oral Daily  . insulin aspart  0-15 Units Subcutaneous TID WC  . insulin aspart  0-5 Units Subcutaneous QHS  . insulin aspart  5 Units Subcutaneous TID WC  . insulin glargine  40 Units Subcutaneous QHS  . loratadine  10 mg Oral Daily  . oxybutynin  10 mg Oral QHS  . pantoprazole  40 mg Oral BID  . pravastatin  80 mg Oral q1800  . sodium chloride flush  3 mL Intravenous Q12H  . tamsulosin  0.4 mg Oral Daily   Continuous Infusions:    LOS: 4 days    Time spent:  25 min    Shelitha Magley A, DO [facility] Pager [phone]  If 7PM-7AM, please contact night-coverage [email or web address] Password [email or web address] 08/25/2016, 2:14 PM

## 2016-08-25 NOTE — Progress Notes (Signed)
Clinical Social Worker spoke with patients daughter Gavin Pound(Deborah) via phone about SNF placement. Gavin PoundDeborah stated she would like patient to discharge to [facility] once patient is medically ready. CSW contacted [facility] and spoke to Admissions Coordinator Solmon Ice(Niki). Solmon Iceiki stated they are able to take patient over the weekend. If patient discharge over the weekend please call Solmon Iceiki or another admissions coordinator at [phone]) to make them aware patient is coming.    Marrianne MoodAshley Yisell Sprunger, MSW,  [employer]CSWA [phone]

## 2016-08-25 NOTE — Progress Notes (Signed)
[facility] Progress Note  Joshua BowensRaymond Walter Dalton 76 y.o. 07/16/1940  CC:  Dysphagia   Subjective:  No acute issues overnight. Tolerating soft diet. Denied any nausea or vomiting.  ROS : Negative for chest pain.Marland Kitchen. Positive for cough.   Objective: Vital signs in last 24 hours: Vitals:   08/24/16 1951 08/25/16 0542  BP: 131/80 123/62  Pulse: 78 74  Resp: 18 18  Temp: 98.4 F (36.9 C) 98.2 F (36.8 C)    Physical Exam:  Constitutional: He is oriented to person, place, and time. He appears well-developed and well-nourished. No distress. Neck: Normal range of motion. No tracheal deviation present.  Cardiovascular: Normal rate, regular rhythm and normal heart sounds.   Pulmonary/Chest: Effort normal. He has no wheezes.  Decreased breath sounds bilaterally with left basilar fine crackles. No respiratory distress.  good situation currently on room air.  Abdominal: Soft. Bowel sounds are normal. ND  He exhibits no mass. There is no tenderness. There is no rebound and no guarding.  Neurological: He is alert and oriented to person, place, and time.  Psychiatric: He has a normal mood and affect. His behavior is normal. Thought content normal.   Lab Results:  Recent Labs  08/23/16 0510 08/25/16 0751  NA 135 138  K 4.0 3.8  CL 104 106  CO2 22 23  GLUCOSE 180* 120*  BUN 31* 29*  CREATININE 2.23* 2.23*  CALCIUM 9.4 9.5   No results for input(s): AST, ALT, ALKPHOS, BILITOT, PROT, ALBUMIN in the last 72 hours.  Recent Labs  08/24/16 0309 08/25/16 0311  WBC 9.4 10.1  HGB 15.2 15.4  HCT 44.6 45.7  MCV 97.6 96.6  PLT 253 254   No results for input(s): LABPROT, INR in the last 72 hours.    Assessment/Plan: - Dysphagia to both solids and liquids. Barium swallow showed no stricture or mass but 13 mm barium pill fail to pass GE junction.Patient today complaining of dysphagia with any kind of food. - Ongoing shortness of breath with minimal exertion - History of  DVT.  Pradaxa On hold - Chronic kidney disease.  Recommendations ------------------------- -  EGD tomorrow with possible dilatation. Risks benefits alternatives are discussed with the patient. Verbalized understanding. Nothing by mouth past midnight. Hold Lovenox for now. Further plan based on endoscopic finding   Kathi DerParag Treg Diemer MD, FACP 08/25/2016, 10:37 AM  Pager [phone]  If no answer or after 5 PM call [phone]

## 2016-08-25 NOTE — Progress Notes (Signed)
Pt educated about safety and importance of bed alarm during the night however pt refuses to be on bed alarm. Will continue to round on patient.   Matthew Pais, RN    

## 2016-08-25 NOTE — Progress Notes (Signed)
Physical Therapy Treatment Patient Details Name: Joshua BowensRaymond Walter Haffey MRN: [id number] DOB: 06/01/1940 Today's Date: 08/25/2016    History of Present Illness Joshua BowensRaymond Walter Ruta is a 76 y.o. male with medical history significant of DM2, HTN, OSA, CKD.  Patient presents to the ED with c/o SOB.  Patient was initially evaluated at outside hospital when symptoms onset 3 weeks ago and had a negative stress test performed. Patient states his symptoms have worsened over that time. 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 [place] ago in which he fell and broke his right ankle.    PT Comments    Pt reports he has never been told to keep his right foot NWB.  PT last session told him, RN is aware and tries to get him to be NWB and he is still not.  I educated him that he may risk longer healing time or the need for surgery if he doesn't comply with MD's recommended WB status.    Follow Up Recommendations  SNF;Supervision/Assistance - 24 hour     Equipment Recommendations  Wheelchair cushion (measurements PT);Wheelchair (measurements PT)    Recommendations for Other Services   NA     Precautions / Restrictions Precautions Precautions: Fall Precaution Comments: he has had an acute hospital fall here Restrictions RLE Weight Bearing: Non weight bearing    Mobility  Bed Mobility Overal bed mobility: Modified Independent                Transfers Overall transfer level: Needs assistance Equipment used: Rolling walker (2 wheeled) Transfers: Sit to/from Stand Sit to Stand: Min assist         General transfer comment: Min assist to support trunk during transitions.  Verbal cues for safe hand placement.   Ambulation/Gait Ambulation/Gait assistance: Min guard Ambulation Distance (Feet): 10 Feet Assistive device: Rolling walker (2 wheeled) Gait Pattern/deviations: Step-to pattern Gait velocity: decreased Gait velocity interpretation:  Below normal speed for age/gender General Gait Details: Pt can hop 1-2 times, and then he reverts back [facility] right [facility].  Despite cues he continues this pattern (hop, hop, steps).  He reports no one told him about NWB (PT last session told him).           Balance Overall balance assessment: Needs assistance Sitting-balance support: Feet supported;No upper extremity supported Sitting balance-Leahy Scale: Good     Standing balance support: Bilateral upper extremity supported Standing balance-Leahy Scale: Poor                              Cognition Arousal/Alertness: Awake/alert Behavior During Therapy: WFL for tasks assessed/performed Overall Cognitive Status: No family/caregiver present to determine baseline cognitive functioning                                 General Comments: he seems a bit "off" at times, he will say things like, "no one ever told me that I was supposed to not put weight on my foot" or his story telling will be a bit difficult to follow.  He is alert and oriented x 4             Pertinent Vitals/Pain Pain Assessment: No/denies pain           PT Goals (current goals can now be found in the care plan section)  Acute Rehab PT Goals Patient Stated Goal: to get better and on with his life Progress towards PT goals: Progressing toward goals    Frequency    Min 2X/week (due to departmental protocols, he could benefit from 5xs/wk)      PT Plan Frequency needs to be updated       AM-PAC PT "6 Clicks" Daily Activity  Outcome Measure  Difficulty turning over in bed (including adjusting bedclothes, sheets and blankets)?: None Difficulty moving from lying on back to sitting on the side of the bed? : None Difficulty sitting down on and standing up from a chair with arms (e.g., wheelchair, bedside commode, etc,.)?: Total Help needed moving to and from a bed to chair (including a wheelchair)?: A Little Help needed  walking in hospital room?: A Little Help needed climbing 3-5 steps with a railing? : Total 6 Click Score: 16    End of Session Equipment Utilized During Treatment: Gait belt Activity Tolerance: Patient limited by fatigue Patient left: in chair;with nursing/sitter in room (RN at sink with pt in chair assisting him with a bath. ) Nurse Communication: Mobility status PT Visit Diagnosis: Difficulty in walking, not elsewhere classified (R26.2);History of falling (Z91.81)     Time: 1610-9604 PT Time Calculation (min) (ACUTE ONLY): 24 min  Charges:  $Gait Training: 23-37 mins          Marian Meneely B. Fatimah Sundquist, PT, DPT [phone]             08/25/2016, 2:52 PM

## 2016-08-26 ENCOUNTER — Encounter (HOSPITAL_COMMUNITY): Payer: Self-pay

## 2016-08-26 ENCOUNTER — Inpatient Hospital Stay (HOSPITAL_COMMUNITY): Payer: Medicare Other | Admitting: Anesthesiology

## 2016-08-26 ENCOUNTER — Encounter (HOSPITAL_COMMUNITY)
Admission: EM | Disposition: A | Discharge status: Skilled Nursing Facility | Payer: Self-pay | Source: Home / Self Care | Attending: Internal Medicine

## 2016-08-26 HISTORY — PX: ESOPHAGOGASTRODUODENOSCOPY (EGD) WITH PROPOFOL: SHX5813

## 2016-08-26 LAB — CBC
HCT: 45.8 % (ref 39.0–52.0)
Hemoglobin: 15.7 g/dL (ref 13.0–17.0)
MCH: 33.3 pg (ref 26.0–34.0)
MCHC: 34.3 g/dL (ref 30.0–36.0)
MCV: 97.2 fL (ref 78.0–100.0)
PLATELETS: 238 10*3/uL (ref 150–400)
RBC: 4.71 MIL/uL (ref 4.22–5.81)
RDW: 13.6 % (ref 11.5–15.5)
WBC: 10.2 10*3/uL (ref 4.0–10.5)

## 2016-08-26 LAB — BASIC METABOLIC PANEL WITH GFR
Anion gap: 12 (ref 5–15)
BUN: 32 mg/dL — ABNORMAL HIGH (ref 6–20)
CO2: 24 mmol/L (ref 22–32)
Calcium: 9.7 mg/dL (ref 8.9–10.3)
Chloride: 102 mmol/L (ref 101–111)
Creatinine, Ser: 2.41 mg/dL — ABNORMAL HIGH (ref 0.61–1.24)
GFR calc Af Amer: 28 mL/min — ABNORMAL LOW
GFR calc non Af Amer: 24 mL/min — ABNORMAL LOW
Glucose, Bld: 160 mg/dL — ABNORMAL HIGH (ref 65–99)
Potassium: 3.9 mmol/L (ref 3.5–5.1)
Sodium: 138 mmol/L (ref 135–145)

## 2016-08-26 LAB — GLUCOSE, CAPILLARY
GLUCOSE-CAPILLARY: 151 mg/dL — AB (ref 65–99)
GLUCOSE-CAPILLARY: 134 mg/dL — AB (ref 65–99)

## 2016-08-26 LAB — BASIC METABOLIC PANEL
BUN: 32 mg/dL — AB (ref 4–21)
Creatinine: 2.4 mg/dL — AB (ref 0.6–1.3)
Glucose: 160 mg/dL
SODIUM: 138 mmol/L (ref 137–147)

## 2016-08-26 LAB — CBC AND DIFFERENTIAL: WBC: 10.2 10*3/mL

## 2016-08-26 SURGERY — ESOPHAGOGASTRODUODENOSCOPY (EGD) WITH PROPOFOL
Anesthesia: Monitor Anesthesia Care

## 2016-08-26 MED ORDER — DABIGATRAN ETEXILATE MESYLATE 150 MG PO CAPS
150.0000 mg | ORAL_CAPSULE | Freq: Two times a day (BID) | ORAL | Status: DC
  Administered 2016-08-26: 150 mg via ORAL
  Filled 2016-08-26: qty 1

## 2016-08-26 MED ORDER — PROPOFOL 500 MG/50ML IV EMUL
INTRAVENOUS | Status: DC | PRN
  Administered 2016-08-26: 100 ug/kg/min via INTRAVENOUS

## 2016-08-26 MED ORDER — BUTAMBEN-TETRACAINE-BENZOCAINE 2-2-14 % EX AERO
INHALATION_SPRAY | CUTANEOUS | Status: DC | PRN
  Administered 2016-08-26: 2 via TOPICAL

## 2016-08-26 MED ORDER — PANTOPRAZOLE SODIUM 40 MG PO TBEC: 40.0000 mg | DELAYED_RELEASE_TABLET | Freq: Every day | ORAL | 1 refills | Status: AC

## 2016-08-26 MED ORDER — PROPOFOL 10 MG/ML IV BOLUS
INTRAVENOUS | Status: DC | PRN
  Administered 2016-08-26: 50 mg via INTRAVENOUS

## 2016-08-26 MED ORDER — PHENYLEPHRINE HCL 10 MG/ML IJ SOLN
INTRAMUSCULAR | Status: DC | PRN
  Administered 2016-08-26: 80 ug via INTRAVENOUS
  Administered 2016-08-26: 40 ug via INTRAVENOUS

## 2016-08-26 MED ORDER — ORAL CARE MOUTH RINSE
15.0000 mL | Freq: Two times a day (BID) | OROMUCOSAL | Status: DC
Start: 2016-08-26 — End: 2016-08-26

## 2016-08-26 SURGICAL SUPPLY — 14 items

## 2016-08-26 NOTE — Anesthesia Postprocedure Evaluation (Signed)
Anesthesia Post Note  Patient: Joshua Dalton  Procedure(s) Performed: Procedure(s) (LRB): ESOPHAGOGASTRODUODENOSCOPY (EGD) WITH PROPOFOL (N/A)  Patient location during evaluation: PACU Anesthesia Type: MAC Level of consciousness: awake and alert Pain management: pain level controlled Vital Signs Assessment: post-procedure vital signs reviewed and stable Respiratory status: spontaneous breathing, nonlabored ventilation, respiratory function stable and patient connected to nasal cannula oxygen Cardiovascular status: stable and blood pressure returned to baseline Anesthetic complications: no       Last Vitals:  Vitals:   08/26/16 1140 08/26/16 1147  BP: (!) 112/57 (!) 124/59  Pulse: 65 62  Resp: 12 14  Temp:      Last Pain:  Vitals:   08/26/16 1125  TempSrc: Oral  PainSc:                  Ellyse Rotolo DAVID

## 2016-08-26 NOTE — Anesthesia Preprocedure Evaluation (Signed)
Anesthesia Evaluation  Patient identified by MRN, date of birth, ID band Patient awake    Reviewed: Allergy & Precautions, NPO status , Patient's Chart, lab work & pertinent test results  Airway Mallampati: II  TM Distance: >3 FB Neck ROM: Full    Dental   Pulmonary asthma , sleep apnea , former smoker,    Pulmonary exam normal        Cardiovascular hypertension, + CAD and + CABG  Normal cardiovascular exam     Neuro/Psych    GI/Hepatic   Endo/Other  diabetes, Type 2, Oral Hypoglycemic Agents, Insulin Dependent  Renal/GU Renal InsufficiencyRenal disease     Musculoskeletal   Abdominal   Peds  Hematology   Anesthesia Other Findings   Reproductive/Obstetrics                             Anesthesia Physical Anesthesia Plan  ASA: III  Anesthesia Plan: MAC   Post-op Pain Management:    Induction: Intravenous  Airway Management Planned: Simple Face Mask  Additional Equipment:   Intra-op Plan:   Post-operative Plan:   Informed Consent: I have reviewed the patients History and Physical, chart, labs and discussed the procedure including the risks, benefits and alternatives for the proposed anesthesia with the patient or authorized representative who has indicated his/her understanding and acceptance.     Plan Discussed with: CRNA and Surgeon  Anesthesia Plan Comments:         Anesthesia Quick Evaluation

## 2016-08-26 NOTE — Progress Notes (Signed)
Patient came back from EEG test. Family members at bedside. MD notified.All PO medicine  resume and patient able to tolerate soft diet. Will continue to monitor.

## 2016-08-26 NOTE — Clinical Social Work Note (Signed)
Clinical Social Worker facilitated patient discharge including contacting patient family and facility to confirm patient discharge plans.  Clinical information faxed to facility and family agreeable with plan.  CSW arranged transport via family to [facility].  RN to call report prior to discharge.  Clinical Social Worker will sign off for now as social work intervention is no longer needed. Please consult us again if new need arises.  Macario GoldsJesse Amil Bouwman, [place] [phone]

## 2016-08-26 NOTE — Progress Notes (Signed)
Paient to DC to SNF as facilitated by CSW. DME will be arranged after DC from SNF

## 2016-08-26 NOTE — Progress Notes (Signed)
Attempted to call [facility] thrice  to give a report but nobody answer the phone.

## 2016-08-26 NOTE — Progress Notes (Signed)
Patient HR went down to 32 bpm. 2nd degree AV block. Patient asymptomatic. MD for [facility] notified.   Will contine to monitor.  Joyelle Siedlecki, RN

## 2016-08-26 NOTE — Progress Notes (Signed)
[facility] Progress Note  Isabella BowensRaymond Walter Roseboom 76 y.o. 09/01/1940  CC:  Dysphagia   Subjective:  No acute issues overnight. Tolerating soft diet. Denied any nausea or vomiting.  ROS : Negative for chest pain.Marland Kitchen. Positive for cough.   Objective: Vital signs in last 24 hours: Vitals:   08/26/16 0401 08/26/16 1026  BP: 119/61 133/65  Pulse: 64 70  Resp: 17 15  Temp: 97.7 F (36.5 C) 98 F (36.7 C)    Physical Exam:  Constitutional: He is oriented to person, place, and time. He appears well-developed and well-nourished. No distress. Neck: Normal range of motion. No tracheal deviation present.  Cardiovascular: Normal rate, regular rhythm and normal heart sounds.   Pulmonary/Chest: Effort normal. He has no wheezes.  Decreased breath sounds bilaterally with left basilar fine crackles. No respiratory distress.    Abdominal: Soft. Bowel sounds are normal. ND  He exhibits no mass. There is no tenderness. There is no rebound and no guarding.  Neurological: He is alert and oriented to person, place, and time.  Psychiatric: He has a normal mood and affect. His behavior is normal. Thought content normal.   Lab Results:  Recent Labs  08/25/16 0751 08/26/16 0339  NA 138 138  K 3.8 3.9  CL 106 102  CO2 23 24  GLUCOSE 120* 160*  BUN 29* 32*  CREATININE 2.23* 2.41*  CALCIUM 9.5 9.7   No results for input(s): AST, ALT, ALKPHOS, BILITOT, PROT, ALBUMIN in the last 72 hours.  Recent Labs  08/25/16 0311 08/26/16 0339  WBC 10.1 10.2  HGB 15.4 15.7  HCT 45.7 45.8  MCV 96.6 97.2  PLT 254 238   No results for input(s): LABPROT, INR in the last 72 hours.    Assessment/Plan: - Dysphagia to both solids and liquids. Barium swallow showed no stricture or mass but 13 mm barium pill fail to pass GE junction.Patient today complaining of dysphagia with any kind of food. - Ongoing shortness of breath with minimal exertion - History of DVT.  Pradaxa On hold - Chronic kidney  disease.  Recommendations ------------------------- -  EGD today with possible dilatation. Risks benefits alternatives are discussed with the patient. Verbalized understanding. Patient is at increased risk of bleeding because of his recent use of anticoagulation. Risk of perforation and bleeding discussed in detail with the patient as well as family. Further plan based on endoscopic finding   Kathi DerParag Loucile Posner MD, FACP 08/26/2016, 10:49 AM  Pager [phone]  If no answer or after 5 PM call [phone]

## 2016-08-26 NOTE — Discharge Summary (Signed)
Physician Discharge Summary  Joshua BowensRaymond Walter Dalton ZOX:[id number] DOB: 04/08/1940 DOA: 08/20/2016  PCP: System, Pcp Not In  Admit date: 08/20/2016 Discharge date: 08/26/2016  Admitted From: Home Disposition: SNF  Recommendations for Outpatient Follow-up:  1. Follow up with PCP in 1-2 weeks. 2. Please obtain BMP/CBC in one week. 3. 30 day monitor in the nursing home, contact [facility]. 4. Follow-up with primary cardiology as outpatient, consider stress testing.  Home Health: NA Equipment/Devices:NA  Discharge Condition: Stable CODE STATUS: Full Code Diet recommendation: DIET SOFT Room service appropriate? Yes; Fluid consistency: Thin Diet - low sodium heart healthy  Brief/Interim Summary: Joshua KelchRaymond Walter Thompsonis a 76 y.o.malewith medical history significant of DM2, HTN, OSA, CKD. Patient presents to the ED with c/o SOB. Patient was initially evaluated at outside hospital when symptoms onset3 weeks ago and had a negative stress test performed. Patient states his symptoms haveworsenedover that time. 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 [place] [facility] [place] in which he fell and broke his right ankle. [place] as ankle is being taken care of by ortho [place]. [place]. Sob stable and then patient c/o dysphagia and pills getting stuck in throat.  Work up in progress.  Tentative plan for EGD on Saturday and possible d/c Saturday PM to SNF.  Stay also complicated by nocturnal 3rd degree HB   Discharge Diagnoses:  Principal Problem:   Dyspnea Active Problems:   DM2 (diabetes mellitus, type 2) (HCC)   HTN (hypertension)   CKD (chronic kidney disease)   Dyspnea - had been on-going for several months (no hypoxia) -VQ scan normal -Has gained 30 lbs in last few months?- daughter says patient is not very active, even before breaking his foot -BNP  normal -RBBB noted, no prior available for comparison -2d echo: normal EF- grade 1 diastolic dsfxn -Recommend to do outpatient PFTs and stress test he continues to have dyspnea.  Nocturnal third degree AV block -Episodes of third-degree AV block at night, patient has OSA on CPAP. -Evaluated by [facility] cardiology and recommended 30 day event monitor on discharge.  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  CKD stage III- unknown baseline -Resume lasix, around baseline of 2.3, follow renal function.  DM 2 -Lantus 40 units QHS -Appears to be controlled, Lantus and NovoLog continued.  H/o DVT -Holding Pradaxa, [place] (fax #: [phone]) who states patient was on pradaxa for a DVT in 2016-- ok to hold for a few days for procedure. -Continued PRADAXA for now, renal function is marginal, if creatinine get worse consider to stop Pradaxa.   HTN -Stable  Obesity -Body mass index is 40.43 kg/m. counseled about weight loss.  OSA -cpap at night  Right leg fracture -Patient [place] in [place] [facility] [place] [place], it's in a cast now, follow-up with [facility] as outpatient.   Discharge Instructions  Discharge Instructions    Diet - low sodium heart healthy    Complete by:  As directed    Increase activity slowly    Complete by:  As directed      Allergies as of 08/26/2016   No Known Allergies     Medication List    TAKE these medications   allopurinol 300 MG tablet Commonly known as:  ZYLOPRIM Take 300 mg by mouth daily.   dabigatran 150 MG  Caps capsule Commonly known as:  PRADAXA Take 150 mg by mouth 2 (two) times daily.   fenofibrate 145 MG tablet Commonly known as:  TRICOR Take 145 mg by mouth daily.   fexofenadine 180 MG tablet Commonly known as:  ALLEGRA Take 180 mg by mouth daily.   finasteride 5 MG tablet Commonly known as:  PROSCAR Take 5 mg by mouth  daily.   FLUoxetine 20 MG capsule Commonly known as:  PROZAC Take 20 mg by mouth daily.   furosemide 40 MG tablet Commonly known as:  LASIX Take 40 mg by mouth daily.   insulin aspart 100 UNIT/ML injection Commonly known as:  novoLOG Inject 20 Units into the skin 3 (three) times daily before meals.   insulin glargine 100 UNIT/ML injection Commonly known as:  LANTUS Inject 70 Units into the skin at bedtime.   lisinopril 10 MG tablet Commonly known as:  PRINIVIL,ZESTRIL Take 10 mg by mouth daily.   nitroGLYCERIN 0.4 MG SL tablet Commonly known as:  NITROSTAT Place 0.4 mg under the tongue every 5 (five) minutes as needed for chest pain.   oxybutynin 10 MG 24 hr tablet Commonly known as:  DITROPAN-XL Take 10 mg by mouth at bedtime.   pantoprazole 40 MG tablet Commonly known as:  PROTONIX Take 1 tablet (40 mg total) by mouth daily.   pravastatin 80 MG tablet Commonly known as:  PRAVACHOL Take 80 mg by mouth daily.   tamsulosin 0.4 MG Caps capsule Commonly known as:  FLOMAX Take 0.4 mg by mouth daily.            Durable Medical Equipment        Start     Ordered   08/22/16 1234  For home use only DME standard manual wheelchair with seat cushion  Once    Comments:  Patient suffers from fracture which impairs their ability to perform daily activities like weakness in the home.  A walker will not resolve  issue with performing activities of daily living. A wheelchair will allow patient to safely perform daily activities. Patient can safely propel the wheelchair in the home or has a caregiver who can provide assistance.  Accessories: elevating leg rests (ELRs), wheel locks, extensions and anti-tippers.   08/22/16 1234      Contact information for follow-up providers    [facility] - Dme Follow up.   Why:  will deliver wheelchair to room prior to DC Contact information: [address] [place] [address] [phone]            Contact  information for after-discharge care    Destination    [facility] .   Specialty:  Skilled Nursing Facility Contact information: [address] [place] [address] [phone]                 No Known Allergies  Consultations:  Treatment Team:   Kathi Der, MD   Procedures (Echo, Carotid, EGD, Colonoscopy, ERCP)   Radiological studies: Dg Chest 2 View  Result Date: 08/23/2016 CLINICAL DATA:  Shortness of breath, chest pain. EXAM: CHEST  2 VIEW COMPARISON:  Radiographs of Aug 20, 2016. FINDINGS: Stable cardiomediastinal silhouette. Atherosclerosis of thoracic aorta is noted. No pneumothorax or pleural effusion is noted. No acute pulmonary disease is noted. What appears to be a radiographic marker is seen projected over the soft tissues of the right axillary region. No corresponding marker is seen on the left side. Nodular  density seen in right lung on prior exam is not visualized currently. Bony thorax is unremarkable. IMPRESSION: No active cardiopulmonary disease. Aortic atherosclerosis. Nodular density seen in right lung on prior exam is not visualized currently. Electronically Signed   By: Lupita Raider, M.D.   On: 08/23/2016 09:38   Dg Chest 2 View  Result Date: 08/20/2016 CLINICAL DATA:  Shortness of breath EXAM: CHEST  2 VIEW COMPARISON:  Aug 03, 2003 FINDINGS: A rounded density projects over the lateral right lung base, not the when seen previously. No other nodules or masses. No focal infiltrates. No pneumothorax. The cardiomediastinal silhouette is unremarkable. IMPRESSION: Nodular density projected over the lateral right lung base may represent a nipple shadow or pulmonary nodule based on this study. Recommend a repeat film with nipple markers to confirm. No focal infiltrate. Electronically Signed   By: Gerome Sam III M.D   On: 08/20/2016 18:40   Dg Esophagus  Result Date: 08/22/2016 CLINICAL DATA:  Difficulty  swallowing. EXAM: ESOPHOGRAM / BARIUM SWALLOW / BARIUM TABLET STUDY TECHNIQUE: Single contrast examination performed using thick barium liquid and thin barium liquid. The patient was observed with fluoroscopy swallowing a 13 mm barium sulphate tablet. FLUOROSCOPY TIME:  Fluoroscopy Time:  3 minutes 18 seconds Radiation Exposure Index (if provided by the fluoroscopic device): 65.2 Number of Acquired Spot Images: 7 COMPARISON:  None. FINDINGS: Limited exam due to patient immobility. The esophagus is patulous. There is no stricture or mass within the mid esophagus or distal esophagus. No mucosal irregularity identified. 13 mm barium tablet failed the passed the GE junction. Attempts to advanced tablet with water and thin barium as well as LEFT -sided down patient positioning. Patient could not be placed up RIGHT due to ankle fracture. IMPRESSION: 1. A 13 mm barium fail to pass the GE junction. This may be in part due to limited patient positioning (patient could not be imaged upright); however cannot exclude a distal esophageal stricture. Consider endoscopy for further evaluation. 2. 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 Electronically Signed   By: Genevive Bi M.D.   On: 08/22/2016 13:49   Nm Pulmonary Perf And Vent  Result Date: 08/21/2016 CLINICAL DATA:  Shortness of breath today. EXAM: NUCLEAR MEDICINE VENTILATION - PERFUSION LUNG SCAN TECHNIQUE: Ventilation images were obtained in multiple projections using inhaled aerosol Tc-55m DTPA. Perfusion images were obtained in multiple projections after intravenous injection of Tc-23m MAA. RADIOPHARMACEUTICALS:  31.5 mCi Technetium-23m DTPA aerosol inhalation and 4.4 mCi Technetium-34m MAA IV COMPARISON:  Chest x-ray 08/20/2016 FINDINGS: Ventilation: Normal ventilation study. Perfusion: Normal perfusion study. IMPRESSION: Negative V/Q scan for pulmonary embolism. Electronically Signed   By: Rudie Meyer M.D.   On: 08/21/2016 15:30      Subjective:  Discharge Exam: Vitals:   08/26/16 1125 08/26/16 1140 08/26/16 1147 08/26/16 1156  BP: 133/64 (!) 112/57 (!) 124/59 (!) 108/51  Pulse: 67 65 62 65  Resp:  12 14 20   Temp: 97.9 F (36.6 C)   97.8 F (36.6 C)  TempSrc: Oral   Oral  SpO2: 97% 96% 96% 97%  Weight:      Height:       General: Pt is alert, awake, not in acute distress Cardiovascular: RRR, S1/S2 +, no rubs, no gallops Respiratory: CTA bilaterally, no wheezing, no rhonchi Abdominal: Soft, NT, ND, bowel sounds + Extremities: no edema, no cyanosis   The results of significant diagnostics from this hospitalization (including imaging, microbiology, ancillary and laboratory) are listed below for reference.  Microbiology: No results found for this or any previous visit (from the past 240 hour(s)).   Labs: BNP (last 3 results)  Recent Labs  08/20/16 2016  BNP 24.4   Basic Metabolic Panel:  Recent Labs Lab 08/21/16 0442 08/22/16 0510 08/23/16 0510 08/25/16 0751 08/26/16 0339  NA 138 139 135 138 138  K 3.6 3.9 4.0 3.8 3.9  CL 105 104 104 106 102  CO2 25 25 22 23 24   GLUCOSE 216* 99 180* 120* 160*  BUN 37* 34* 31* 29* 32*  CREATININE 2.47* 2.25* 2.23* 2.23* 2.41*  CALCIUM 9.4 9.2 9.4 9.5 9.7   Liver Function Tests: No results for input(s): AST, ALT, ALKPHOS, BILITOT, PROT, ALBUMIN in the last 168 hours. No results for input(s): LIPASE, AMYLASE in the last 168 hours. No results for input(s): AMMONIA in the last 168 hours. CBC:  Recent Labs Lab 08/22/16 0510 08/23/16 0510 08/24/16 0309 08/25/16 0311 08/26/16 0339  WBC 10.2 12.2* 9.4 10.1 10.2  HGB 14.6 15.5 15.2 15.4 15.7  HCT 44.2 44.8 44.6 45.7 45.8  MCV 97.4 97.0 97.6 96.6 97.2  PLT 247 246 253 254 238   Cardiac Enzymes: No results for input(s): CKTOTAL, CKMB, CKMBINDEX, TROPONINI in the last 168 hours. BNP: Invalid input(s): POCBNP CBG:  Recent Labs Lab 08/25/16 1128 08/25/16 1720 08/25/16 2143 08/26/16 0800  08/26/16 1041  GLUCAP 192* 298* 236* 151* 134*   D-Dimer No results for input(s): DDIMER in the last 72 hours. Hgb A1c No results for input(s): HGBA1C in the last 72 hours. Lipid Profile No results for input(s): CHOL, HDL, LDLCALC, TRIG, CHOLHDL, LDLDIRECT in the last 72 hours. Thyroid function studies No results for input(s): TSH, T4TOTAL, T3FREE, THYROIDAB in the last 72 hours.  Invalid input(s): FREET3 Anemia work up No results for input(s): VITAMINB12, FOLATE, FERRITIN, TIBC, IRON, RETICCTPCT in the last 72 hours. Urinalysis No results found for: COLORURINE, APPEARANCEUR, LABSPEC, PHURINE, GLUCOSEU, HGBUR, BILIRUBINUR, KETONESUR, PROTEINUR, UROBILINOGEN, NITRITE, LEUKOCYTESUR Sepsis Labs Invalid input(s): PROCALCITONIN,  WBC,  LACTICIDVEN Microbiology No results found for this or any previous visit (from the past 240 hour(s)).   Time coordinating discharge: Over 30 minutes  SIGNED:   Clint Lipps, MD  [facility] 08/26/2016, 12:35 PM Pager   If 7PM-7AM, please contact night-coverage [email or web address] Password [email or web address]

## 2016-08-26 NOTE — Clinical Social Work Placement (Signed)
   CLINICAL SOCIAL WORK PLACEMENT  NOTE  Date:  08/26/2016  Patient Details  Name: Joshua Dalton MRN: [id number] Date of Birth: 02/29/1941  Clinical Social Work is seeking post-discharge placement for this patient at the Skilled  Nursing Facility level of care (*CSW will initial, date and re-position this form in  chart as items are completed):  Yes   Patient/family provided with [place] Clinical Social Work Department's list of facilities offering this level of care within the geographic area requested by the patient (or if unable, by the patient's family).  Yes   Patient/family informed of their freedom to choose among providers that offer the needed level of care, that participate in Medicare, Medicaid or managed care program needed by the patient, have an available bed and are willing to accept the patient.  Yes   Patient/family informed of [facility]'s ownership interest in [facility] and [facility], as well as of the fact that they are under no obligation to receive care at these facilities.  PASRR submitted to EDS on 08/23/16     PASRR number received on       Existing PASRR number confirmed on 08/23/16     FL2 transmitted to all facilities in geographic area requested by pt/family on 08/23/16     FL2 transmitted to all facilities within larger geographic area on       Patient informed that his/her managed care company has contracts with or will negotiate with certain facilities, including the following:        Yes   Patient/family informed of bed offers received.  Patient [facility]     Physician recommends and patient chooses bed at      Patient to be [facility] on 08/26/16.  [facility]     Patient family notified on 08/26/16 of transfer.  Name of family member notified:  Patient daughterEunice Blase- Debbie over the phone     PHYSICIAN Please sign  FL2     Additional Comment:    Macario GoldsJesse Rodrickus Min, LCSW [phone]

## 2016-08-26 NOTE — Brief Op Note (Signed)
08/20/2016 - 08/26/2016  11:18 AM  PATIENT:  Isabella Bowensaymond Walter Harth  76 y.o. male  PRE-OPERATIVE DIAGNOSIS:  Dysphagia. Abnormal barium swallow  POST-OPERATIVE DIAGNOSIS:  gastritis; maloney dilation  PROCEDURE:  Procedure(s): ESOPHAGOGASTRODUODENOSCOPY (EGD) WITH PROPOFOL (N/A)  SURGEON:  Surgeon(s) and Role:    * Manuela Halbur, MD - Primary  Findings/recommendations ------------------------------------- - EGD showed no esophageal stricture. It showed tertiary contraction. It was dilated with 40 [place] Maloney. - It showed minimal gastritis. - Recommend once a day PPI. - Patient needs to be on soft diet. He needs to chew food well and moist before swallowing - Okay to resume anticoagulation from GI standpoint - If patient continues to have symptoms, he needs repeat dilatation in 4-6 weeks. This can be done by [place] - Follow-up with primary GI in 3-4 weeks after discharge - GI will sign off . Call us back if needed

## 2016-08-26 NOTE — Transfer of Care (Signed)
Immediate Anesthesia Transfer of Care Note  Patient: Joshua Dalton  Procedure(s) Performed: Procedure(s): ESOPHAGOGASTRODUODENOSCOPY (EGD) WITH PROPOFOL (N/A)  Patient Location: PACU  Anesthesia Type:MAC  Level of Consciousness: awake, alert , oriented and patient cooperative  Airway & Oxygen Therapy: Patient Spontanous Breathing and Patient connected to nasal cannula oxygen  Post-op Assessment: Report given to RN, Post -op Vital signs reviewed and stable, Patient moving all extremities and Patient moving all extremities X 4  Post vital signs: Reviewed and stable  Last Vitals:  Vitals:   08/26/16 1026 08/26/16 1125  BP: 133/65 133/64  Pulse: 70 67  Resp: 15   Temp: 36.7 C 36.6 C    Last Pain:  Vitals:   08/26/16 1125  TempSrc: Oral  PainSc:          Complications: No apparent anesthesia complications

## 2016-08-26 NOTE — Anesthesia Procedure Notes (Signed)
Procedure Name: MAC Date/Time: 08/26/2016 10:55 AM Performed by: Izora Gala Pre-anesthesia Checklist: Patient identified, Emergency Drugs available, Suction available and Patient being monitored Patient Re-evaluated:Patient Re-evaluated prior to inductionOxygen Delivery Method: Nasal cannula Intubation Type: IV induction Placement Confirmation: positive ETCO2

## 2016-08-26 NOTE — Progress Notes (Signed)
Patient is discharge to home accompanied by patient's daughter and NT via wheelchair. Discharge instructions given . Patient verbalizes understanding. All personal belongings given. Telemetry box and IV removed prior to discharge and site in good condition.  

## 2016-08-26 NOTE — Op Note (Signed)
[facility] Patient Name: Joshua Dalton Procedure Date : 08/26/2016 MRN: [id number] Attending MD: Kathi Der , MD Date of Birth: 11-05-1940 CSN: [id number] Age: 76 Admit Type: Inpatient Procedure:                Upper GI endoscopy Indications:              Dysphagia, Abnormal UGI series Providers:                Kathi Der, MD, Priscella Mann, RN,                            Kandice Robinsons, Technician Referring MD:              Medicines:                Sedation Administered by an Anesthesia Professional Complications:            No immediate complications. Estimated Blood Loss:     Estimated blood loss was minimal. Procedure:                Pre-Anesthesia Assessment:                           - Prior to the procedure, a History and Physical                            was performed, and patient medications and                            allergies were reviewed. The patient's tolerance of                            previous anesthesia was also reviewed. The risks                            and benefits of the procedure and the sedation                            options and risks were discussed with the patient.                            All questions were answered, and informed consent                            was obtained. Prior Anticoagulants: The patient has                            taken Pradaxa (dabigatran), last dose was 4 days                            prior to procedure. ASA Grade Assessment: III - A                            patient with severe systemic disease. After  reviewing the risks and benefits, the patient was                            deemed in satisfactory condition to undergo the                            procedure.                           After obtaining informed consent, the endoscope was                            passed under direct vision. Throughout the                            procedure,  the patient's blood pressure, pulse, and                            oxygen saturations were monitored continuously. The                            EG-[id number] ([id number]) scope was introduced through the                            mouth, and advanced to the second part of duodenum.                            The upper GI endoscopy was accomplished without                            difficulty. The patient tolerated the procedure                            well. Findings:      The Z-line was regular and was found 39 cm from the incisors.      Abnormal motility was noted in the esophagus. Tertiary peristaltic waves       are noted.      Scattered minimal inflammation characterized by congestion (edema) and       friability was found in the gastric body.      The cardia and gastric fundus were normal on retroflexion.      The duodenal bulb, first portion of the duodenum and second portion of       the duodenum were normal.      No endoscopic abnormality was evident in the esophagus to explain the       patient's complaint of dysphagia. It was decided, however, to proceed       with dilation of the entire esophagus. The scope was withdrawn. Dilation       was performed with a Maloney dilator with no resistance at 40 Fr. The       dilation site was examined following endoscope reinsertion and showed no       bleeding, mucosal tear or perforation. Impression:               - Z-line regular, 39 cm from the incisors.                           -  Abnormal esophageal motility, suspicious for                            presbyesophagus.                           - Gastritis.                           - Normal duodenal bulb, first portion of the                            duodenum and second portion of the duodenum.                           - No endoscopic esophageal abnormality to explain                            patient's dysphagia. Esophagus dilated. Dilated.                           - No specimens  collected. Moderate Sedation:      Moderate (conscious) sedation was personally administered by an       anesthesia professional. The following parameters were monitored: oxygen       saturation, heart rate, blood pressure, and response to care. Recommendation:           - Return patient to hospital ward for ongoing care.                           - Soft diet.                           - Use Protonix (pantoprazole) 40 mg PO daily.                           - Resume Pradaxa (dabigatran) at prior dose today.                           - Repeat upper endoscopy PRN for retreatment.                           - Return to GI clinic in 4 weeks. Procedure Code(s):        --- Professional ---                           [phone], Esophagogastroduodenoscopy, flexible,                            transoral; diagnostic, including collection of                            specimen(s) by brushing or washing, when performed                            (separate procedure)  43450, Dilation of esophagus, by unguided sound or                            bougie, single or multiple passes Diagnosis Code(s):        --- Professional ---                           K22.4, Dyskinesia of esophagus                           K29.70, Gastritis, unspecified, without bleeding                           R13.10, Dysphagia, unspecified                           R93.3, Abnormal findings on diagnostic imaging of                            other parts of digestive tract CPT copyright 2016 [employer] Association. All rights reserved. The codes documented in this report are preliminary and upon coder review may  be revised to meet current compliance requirements. Kathi Der, MD Kathi Der, MD 08/26/2016 11:18:10 AM Number of Addenda: 0

## 2016-08-29 ENCOUNTER — Non-Acute Institutional Stay (SKILLED_NURSING_FACILITY): Payer: Medicare Other | Admitting: Internal Medicine

## 2016-08-29 ENCOUNTER — Encounter: Payer: Self-pay | Admitting: Internal Medicine

## 2016-08-29 DIAGNOSIS — Z86718 Personal history of other venous thrombosis and embolism: Secondary | ICD-10-CM | POA: Diagnosis not present

## 2016-08-29 DIAGNOSIS — I442 Atrioventricular block, complete: Secondary | ICD-10-CM | POA: Diagnosis not present

## 2016-08-29 DIAGNOSIS — G4733 Obstructive sleep apnea (adult) (pediatric): Secondary | ICD-10-CM | POA: Diagnosis not present

## 2016-08-29 DIAGNOSIS — N183 Chronic kidney disease, stage 3 unspecified: Secondary | ICD-10-CM

## 2016-08-29 DIAGNOSIS — R0602 Shortness of breath: Secondary | ICD-10-CM | POA: Diagnosis not present

## 2016-08-29 DIAGNOSIS — E1122 Type 2 diabetes mellitus with diabetic chronic kidney disease: Secondary | ICD-10-CM | POA: Diagnosis not present

## 2016-08-29 DIAGNOSIS — Z794 Long term (current) use of insulin: Secondary | ICD-10-CM | POA: Diagnosis not present

## 2016-08-29 DIAGNOSIS — R1319 Other dysphagia: Secondary | ICD-10-CM

## 2016-08-29 DIAGNOSIS — I10 Essential (primary) hypertension: Secondary | ICD-10-CM | POA: Diagnosis not present

## 2016-08-29 DIAGNOSIS — R1310 Dysphagia, unspecified: Secondary | ICD-10-CM

## 2016-08-29 NOTE — Progress Notes (Signed)
: Provider:  Randon GoldsmithAnne D. Lyn HollingsheadAlexander, MD Location:  [facility] Room Number: 210D Place of Service:  SNF (31)  PCP: System, Pcp Not In Patient Care Team: System, Pcp Not In as PCP - General (General Practice)  Extended Emergency Contact Information Primary Emergency Contact: Doom,Deborah Address: [address]          [place], [place] [address] [place] Home Phone: [phone] Mobile Phone: [phone] Relation: Daughter     Allergies: Patient has no known allergies.  Chief Complaint  Patient presents with  . New Admit To SNF    Admit to Facility    HPI: Patient is 76 y.o. male with DM2, HTN, OSA, CKD. Patient presents to the ED with c/o SOB. Patient was initially evaluated at outside hospital when symptoms onset3 weeks ago and had a negative stress test performed. Patient states his symptoms haveworsenedover that time. 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 [facility] [place] [facility] ago in which he fell and broke his right ankle. [place] as ankle is being taken care of by ortho here. [place]. [facility] where Sob being stable the  patient c/o dysphagia and pills getting stuck in throat. Work up in progress. Tentative plan for EGD on Saturday and possible d/c Saturday PM to SNF. 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 Pt is admitted to SNF for OT/PT. While at SNF pt will be followed for h/o DVT, tx with pradaxa, DM2, tx with insulin and BPH tx with proscar, ditropan and flomax.  Past Medical History:  Diagnosis Date  . Asthma   . CHF (congestive heart failure) (HCC)    patient not sure what kind  . Diabetes mellitus without complication (HCC)   . Heart disease   . Hypercholesteremia   . Hypertension   . Obstructive sleep apnea     Past Surgical  History:  Procedure Laterality Date  . CARDIAC SURGERY    . ESOPHAGOGASTRODUODENOSCOPY (EGD) WITH PROPOFOL N/A 08/26/2016   Procedure: ESOPHAGOGASTRODUODENOSCOPY (EGD) WITH PROPOFOL;  Surgeon: Kathi DerBrahmbhatt, Parag, MD;  Location: [facility];  Service: Gastroenterology;  Laterality: N/A;    Allergies as of 08/29/2016   No Known Allergies     Medication List       Accurate as of 08/29/16  3:59 PM. Always use your most recent med list.          allopurinol 300 MG tablet Commonly known as:  ZYLOPRIM Take 300 mg by mouth daily.   dabigatran 150 MG Caps capsule Commonly known as:  PRADAXA Take 150 mg by mouth 2 (two) times daily.   fenofibrate 145 MG tablet Commonly known as:  TRICOR Take 145 mg by mouth daily.   fexofenadine 180 MG tablet Commonly known as:  ALLEGRA Take 180 mg by mouth daily.   finasteride 5 MG tablet Commonly known as:  PROSCAR Take 5 mg by mouth daily.   FLUoxetine 20 MG capsule Commonly known as:  PROZAC Take 20 mg by mouth daily.   furosemide 40 MG tablet Commonly known as:  LASIX Take 40 mg by mouth daily.   insulin aspart 100 UNIT/ML injection Commonly known as:  novoLOG Inject 20 Units into the skin 3 (three) times daily before meals.   insulin glargine 100 UNIT/ML injection Commonly known as:  LANTUS Inject 70 Units into the skin at bedtime.   lisinopril 10 MG tablet Commonly known as:  PRINIVIL,ZESTRIL Take 10 mg by mouth daily.   nitroGLYCERIN 0.4 MG SL tablet Commonly known as:  NITROSTAT Place 0.4 mg under the tongue every 5 (five) minutes as needed for chest pain.   oxybutynin 10 MG 24 hr tablet Commonly known as:  DITROPAN-XL Take 10 mg by mouth at bedtime.   pantoprazole 40 MG tablet Commonly known as:  PROTONIX Take 1 tablet (40 mg total) by mouth daily.   pravastatin 80 MG tablet Commonly known as:  PRAVACHOL Take 80 mg by mouth daily.   tamsulosin 0.4 MG Caps capsule Commonly known as:  FLOMAX Take 0.4 mg by mouth  daily.       No orders of the defined types were placed in this encounter.   Immunization History  Administered Date(s) Administered  . Tdap 10/23/2015    Social History  Substance Use Topics  . Smoking status: Former Games developer  . Smokeless tobacco: Never Used  . Alcohol use No    Family history is   No family history on file.    Review of Systems  DATA OBTAINED: from patient, nurse GENERAL:  no fevers, fatigue, appetite changes SKIN: No itching, or rash EYES: No eye pain, redness, discharge EARS: No earache, tinnitus, change in hearing NOSE: No congestion, drainage or bleeding  MOUTH/THROAT: No mouth or tooth pain, No sore throat RESPIRATORY: No cough, wheezing, SOB CARDIAC: No chest pain, palpitations, lower extremity edema  GI: No abdominal pain, No N/V/D or constipation, No heartburn or reflux  GU: No dysuria, frequency or urgency, or incontinence  MUSCULOSKELETAL: R leg in cast NEUROLOGIC: No headache, dizziness or focal weakness PSYCHIATRIC: No c/o anxiety or sadness   Vitals:   08/29/16 1528  BP: 101/62  Pulse: 84  Resp: 20  Temp: 97.4 F (36.3 C)    SpO2 Readings from Last 1 Encounters:  08/29/16 94%   Body mass index is 39.91 kg/m.     Physical Exam  GENERAL APPEARANCE: Alert, conversant,  No acute distress.  SKIN: No diaphoresis rash HEAD: Normocephalic, atraumatic  EYES: Conjunctiva/lids clear. Pupils round, reactive. EOMs intact.  EARS: External exam WNL, canals clear. Hearing grossly normal.  NOSE: No deformity or discharge.  MOUTH/THROAT: Lips w/o lesions  RESPIRATORY: Breathing is even, unlabored. Lung sounds are clear   CARDIOVASCULAR: Heart RRR no murmurs, rubs or gallops. 1+ peripheral edema.   GASTROINTESTINAL: Abdomen is soft, non-tender, not distended w/ normal bowel sounds. GENITOURINARY: Bladder non tender, not distended  MUSCULOSKELETAL: No abnormal joints or musculature NEUROLOGIC:  Cranial nerves 2-12 grossly intact.  Moves all extremities  PSYCHIATRIC: Mood and affect appropriate to situation, no behavioral issues  Patient Active Problem List   Diagnosis Date Noted  . Dyspnea 08/20/2016  . DM2 (diabetes mellitus, type 2) (HCC) 08/20/2016  . HTN (hypertension) 08/20/2016  . CKD (chronic kidney disease) 08/20/2016      Labs reviewed: Basic Metabolic Panel:    Component Value Date/Time   NA 138 08/26/2016 0339   NA 138 08/26/2016   K 3.9 08/26/2016 0339   CL 102 08/26/2016 0339   CO2 24 08/26/2016 0339   GLUCOSE 160 (H) 08/26/2016 0339   BUN 32 (H) 08/26/2016 0339   BUN 32 (A) 08/26/2016   CREATININE 2.41 (H) 08/26/2016 0339   CALCIUM 9.7 08/26/2016 0339   GFRNONAA 24 (L) 08/26/2016 0339   GFRAA 28 (L) 08/26/2016 6962  Recent Labs  08/23/16 0510 08/25/16 08/25/16 0751 08/26/16 08/26/16 0339  NA 135 138 138 138 138  K 4.0 3.8 3.8  --  3.9  CL 104  --  106  --  102  CO2 22  --  23  --  24  GLUCOSE 180*  --  120*  --  160*  BUN 31* 29* 29* 32* 32*  CREATININE 2.23* 2.2* 2.23* 2.4* 2.41*  CALCIUM 9.4  --  9.5  --  9.7   Liver Function Tests: No results for input(s): AST, ALT, ALKPHOS, BILITOT, PROT, ALBUMIN in the last 8760 hours. No results for input(s): LIPASE, AMYLASE in the last 8760 hours. No results for input(s): AMMONIA in the last 8760 hours. CBC:  Recent Labs  08/24/16 0309 08/25/16 08/25/16 0311 08/26/16 08/26/16 0339  WBC 9.4 10.1 10.1 10.2 10.2  HGB 15.2 15.4 15.4  --  15.7  HCT 44.6 46 45.7  --  45.8  MCV 97.6  --  96.6  --  97.2  PLT 253 254 254  --  238   Lipid No results for input(s): CHOL, HDL, LDLCALC, TRIG in the last 8760 hours.  Cardiac Enzymes: No results for input(s): CKTOTAL, CKMB, CKMBINDEX, TROPONINI in the last 8760 hours. BNP:  Recent Labs  08/20/16 2016  BNP 24.4   No results found for: MICROALBUR No results found for: HGBA1C Lab Results  Component Value Date   TSH 3.016 08/21/2016   No results found for: VITAMINB12 No  results found for: FOLATE No results found for: IRON, TIBC, FERRITIN  Imaging and Procedures obtained prior to SNF admission: Dg Chest 2 View  Result Date: 08/20/2016 CLINICAL DATA:  Shortness of breath EXAM: CHEST  2 VIEW COMPARISON:  Aug 03, 2003 FINDINGS: A rounded density projects over the lateral right lung base, not the when seen previously. No other nodules or masses. No focal infiltrates. No pneumothorax. The cardiomediastinal silhouette is unremarkable. IMPRESSION: Nodular density projected over the lateral right lung base may represent a nipple shadow or pulmonary nodule based on this study. Recommend a repeat film with nipple markers to confirm. No focal infiltrate. Electronically Signed   By: Gerome Sam III M.D   On: 08/20/2016 18:40   Nm Pulmonary Perf And Vent  Result Date: 08/21/2016 CLINICAL DATA:  Shortness of breath today. EXAM: NUCLEAR MEDICINE VENTILATION - PERFUSION LUNG SCAN TECHNIQUE: Ventilation images were obtained in multiple projections using inhaled aerosol Tc-26m DTPA. Perfusion images were obtained in multiple projections after intravenous injection of Tc-16m MAA. RADIOPHARMACEUTICALS:  31.5 mCi Technetium-23m DTPA aerosol inhalation and 4.4 mCi Technetium-40m MAA IV COMPARISON:  Chest x-ray 08/20/2016 FINDINGS: Ventilation: Normal ventilation study. Perfusion: Normal perfusion study. IMPRESSION: Negative V/Q scan for pulmonary embolism. Electronically Signed   By: Rudie Meyer M.D.   On: 08/21/2016 15:30     Not all labs, radiology exams or other studies done during hospitalization come through on my EPIC note; however they are reviewed by me.    Assessment and Plan  DYSPNEA WITHOUT HYPOXIA - VQ scan normal -Has gained 30 lbs in last few months?- daughter says patient is not very active, even before breaking his foot -BNP normal -RBBB noted, no prior available for comparison -2d echo: normal EF- grade 1 diastolic dsfxn -Recommend to do outpatient PFTs and  stress test he continues to have dyspnea SNF - pt admitted for oT/PT; already c/o SOB but pulse ox remains excellent; set up for PFT's and Cards  NOCTURNAL THIRD DEGREE BLOCK/  OSA ON CPAP-EP rec 30  Days monitor SNF - will set up 30 day monitor  DYSPHAGIA - no stricture, tertiary contraction, dilated, rec PPI SNF - cont protonix 40 mg daily  CKD 3 SNF - resume lasix, think baseline is 2.3; will follow up BMP  H/O DVT-spoke with PCP in [place], [place] (fax #: [phone]) who states patient was on pradaxa for a DVT in 2016 SNF - Continued PRADAXA for now, renal function is marginal, if creatinine get worse consider to stop Pradaxa.   DM2 SNF - conr lantus 70 mg qHS abd 20 u reg each meal;pt on lisinopril and statin  HTN SNF - stable on lisinopril 10 mg daily and lasix 40 mg daily   Time spent > 25min;> 50% of time with patient was spent reviewing records, labs, tests and studies, counseling and developing plan of care  Thurston Hole D. Lyn Hollingshead, MD

## 2016-09-03 ENCOUNTER — Encounter: Payer: Self-pay | Admitting: Internal Medicine

## 2016-09-03 DIAGNOSIS — R1310 Dysphagia, unspecified: Secondary | ICD-10-CM | POA: Insufficient documentation

## 2016-09-03 DIAGNOSIS — G4733 Obstructive sleep apnea (adult) (pediatric): Secondary | ICD-10-CM | POA: Insufficient documentation

## 2016-09-03 DIAGNOSIS — Z86718 Personal history of other venous thrombosis and embolism: Secondary | ICD-10-CM | POA: Insufficient documentation

## 2016-09-03 DIAGNOSIS — I441 Atrioventricular block, second degree: Secondary | ICD-10-CM | POA: Insufficient documentation

## 2016-09-19 ENCOUNTER — Non-Acute Institutional Stay (SKILLED_NURSING_FACILITY): Payer: Medicare Other | Admitting: Internal Medicine

## 2016-09-19 ENCOUNTER — Encounter: Payer: Self-pay | Admitting: Internal Medicine

## 2016-09-19 DIAGNOSIS — G4733 Obstructive sleep apnea (adult) (pediatric): Secondary | ICD-10-CM | POA: Diagnosis not present

## 2016-09-19 DIAGNOSIS — E785 Hyperlipidemia, unspecified: Secondary | ICD-10-CM | POA: Diagnosis not present

## 2016-09-19 DIAGNOSIS — R0602 Shortness of breath: Secondary | ICD-10-CM

## 2016-09-19 DIAGNOSIS — J9611 Chronic respiratory failure with hypoxia: Secondary | ICD-10-CM

## 2016-09-19 DIAGNOSIS — Z86718 Personal history of other venous thrombosis and embolism: Secondary | ICD-10-CM | POA: Diagnosis not present

## 2016-09-19 DIAGNOSIS — R1310 Dysphagia, unspecified: Secondary | ICD-10-CM | POA: Diagnosis not present

## 2016-09-19 DIAGNOSIS — Z794 Long term (current) use of insulin: Secondary | ICD-10-CM

## 2016-09-19 DIAGNOSIS — N183 Chronic kidney disease, stage 3 unspecified: Secondary | ICD-10-CM

## 2016-09-19 DIAGNOSIS — E1122 Type 2 diabetes mellitus with diabetic chronic kidney disease: Secondary | ICD-10-CM | POA: Diagnosis not present

## 2016-09-19 DIAGNOSIS — I442 Atrioventricular block, complete: Secondary | ICD-10-CM | POA: Diagnosis not present

## 2016-09-19 DIAGNOSIS — R1319 Other dysphagia: Secondary | ICD-10-CM

## 2016-09-19 NOTE — Progress Notes (Addendum)
[facility] Room Number: 103 Place of Service:  SNF [phone](31)  Provider: Randon GoldsmithAnne D. Lyn HollingsheadAlexander, MD  PCP: System, Pcp Not In Patient Care Team: System, Pcp Not In as PCP - General [facility](General Practice)  Extended Emergency Contact Information Primary Emergency Contact: Doom,Deborah Address: [address]          [place], [place] [address] [place] Home Phone: [phone] Mobile Phone: [phone] Relation: Daughter  No Known Allergies  Chief Complaint  Patient presents with  . Discharge Note    discharged from SNF    HPI:  76 y.o. male  with DM2, HTN, OSA, CKD. Patient presents to the ED with c/o SOB. Patient was initially evaluated at outside hospital when symptoms onset3 weeks ago and had a negative stress test performed. Patient states his symptoms haveworsenedover that time. 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 [place] [facility] [place] [facility] [place] ago in which he fell and broke his right ankle. [place] be with [place] as ankle is being taken care of by ortho here. [place] from [place]. [facility] where Sob being stable the  patient c/o dysphagia and pills getting stuck in throat. Work up in progress. Tentative plan for EGD on Saturday and possible d/c Saturday PM to SNF. 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 Pt is admitted to SNF for OT/PT. Pt is now ready to be d/cd to home.    Past Medical History:  Diagnosis Date  . Asthma   . CHF (congestive heart failure) (HCC)    patient not sure what kind  . Chronic kidney disease, stage III (moderate)   . Diabetes mellitus without complication (HCC)   . Heart disease   . Hypercholesteremia   . Hypertension   . Obstructive sleep apnea   . Personal history of venous thrombosis and embolism     Past Surgical History:  Procedure  Laterality Date  . CARDIAC SURGERY    . ESOPHAGOGASTRODUODENOSCOPY (EGD) WITH PROPOFOL N/A 08/26/2016   Procedure: ESOPHAGOGASTRODUODENOSCOPY (EGD) WITH PROPOFOL;  Surgeon: Kathi DerBrahmbhatt, Parag, MD;  Location: [facility];  Service: Gastroenterology;  Laterality: N/A;     reports that he has quit smoking. He has never used smokeless tobacco. He reports that he does not drink alcohol. His drug history is not on file. Social History   Social History  . Marital status: Widowed    Spouse name: N/A  . Number of children: N/A  . Years of education: N/A   Occupational History  . Not on file.   Social History Main Topics  . Smoking status: Former Games developermoker  . Smokeless tobacco: Never Used  . Alcohol use No  . Drug use: Unknown  . Sexual activity: Not on file   Other Topics Concern  . Not on file   Social History Narrative  . No narrative on file    Pertinent  Health Maintenance Due  Topic Date Due  . HEMOGLOBIN A1C  04-Sep-1940  . FOOT EXAM  08/01/1949  . OPHTHALMOLOGY EXAM  08/01/1949  . PNA vac Low Risk Adult (1 of 2 - PCV13) 08/01/2004  . INFLUENZA VACCINE  11/01/2016    Medications: Allergies as of 09/19/2016   No Known Allergies     Medication List       Accurate as of 09/19/16 11:59 PM. Always  use your most recent med list.          allopurinol 300 MG tablet Commonly known as:  ZYLOPRIM Take 300 mg by mouth daily.   dabigatran 150 MG Caps capsule Commonly known as:  PRADAXA Take 150 mg by mouth 2 (two) times daily.   fenofibrate 145 MG tablet Commonly known as:  TRICOR Take 145 mg by mouth daily.   fexofenadine 180 MG tablet Commonly known as:  ALLEGRA Take 180 mg by mouth daily.   finasteride 5 MG tablet Commonly known as:  PROSCAR Take 5 mg by mouth daily.   FLUoxetine 20 MG capsule Commonly known as:  PROZAC Take 20 mg by mouth daily.   furosemide 40 MG tablet Commonly known as:  LASIX Take 40 mg by mouth daily.   insulin aspart 100 UNIT/ML  injection Commonly known as:  novoLOG Inject 20 Units into the skin 3 (three) times daily before meals.   insulin glargine 100 UNIT/ML injection Commonly known as:  LANTUS Inject 70 Units into the skin at bedtime.   lisinopril 10 MG tablet Commonly known as:  PRINIVIL,ZESTRIL Take 10 mg by mouth daily.   nitroGLYCERIN 0.4 MG SL tablet Commonly known as:  NITROSTAT Place 0.4 mg under the tongue every 5 (five) minutes as needed for chest pain.   oxybutynin 10 MG 24 hr tablet Commonly known as:  DITROPAN-XL Take 10 mg by mouth at bedtime.   pantoprazole 40 MG tablet Commonly known as:  PROTONIX Take 1 tablet (40 mg total) by mouth daily.   polyethylene glycol packet Commonly known as:  MIRALAX / GLYCOLAX Take 17 g by mouth 2 (two) times daily.   pravastatin 80 MG tablet Commonly known as:  PRAVACHOL Take 80 mg by mouth daily.   tamsulosin 0.4 MG Caps capsule Commonly known as:  FLOMAX Take 0.4 mg by mouth daily.        Vitals:   09/19/16 0913  BP: 128/76  Pulse: 69  Resp: 17  Temp: (!) 96.5 F (35.8 C)  SpO2: 95%  Weight: 251 lb (113.9 kg)  Height: 5' 6.5" (1.689 m)   Body mass index is 39.91 kg/m.  Physical Exam  GENERAL APPEARANCE: Alert, conversant. No acute distress.  HEENT: Unremarkable. RESPIRATORY: Breathing is even, unlabored. Lung sounds are clear   CARDIOVASCULAR: Heart RRR no murmurs, rubs or gallops. No peripheral edema.  GASTROINTESTINAL: Abdomen is soft, non-tender, not distended w/ normal bowel sounds.  NEUROLOGIC: Cranial nerves 2-12 grossly intact. Moves all extremities   Labs reviewed: Basic Metabolic Panel:  Recent Labs  24/40/10 0510 08/25/16 08/25/16 0751 08/26/16 08/26/16 0339  NA 135 138 138 138 138  K 4.0 3.8 3.8  --  3.9  CL 104  --  106  --  102  CO2 22  --  23  --  24  GLUCOSE 180*  --  120*  --  160*  BUN 31* 29* 29* 32* 32*  CREATININE 2.23* 2.2* 2.23* 2.4* 2.41*  CALCIUM 9.4  --  9.5  --  9.7   No results  found for: [facility] Liver Function Tests: No results for input(s): AST, ALT, ALKPHOS, BILITOT, PROT, ALBUMIN in the last 8760 hours. No results for input(s): LIPASE, AMYLASE in the last 8760 hours. No results for input(s): AMMONIA in the last 8760 hours. CBC:  Recent Labs  08/24/16 0309 08/25/16 08/25/16 0311 08/26/16 08/26/16 0339  WBC 9.4 10.1 10.1 10.2 10.2  HGB 15.2 15.4 15.4  --  15.7  HCT 44.6 46 45.7  --  45.8  MCV 97.6  --  96.6  --  97.2  PLT 253 254 254  --  238   Lipid No results for input(s): CHOL, HDL, LDLCALC, TRIG in the last 8760 hours. Cardiac Enzymes: No results for input(s): CKTOTAL, CKMB, CKMBINDEX, TROPONINI in the last 8760 hours. BNP:  Recent Labs  08/20/16 2016  BNP 24.4   CBG:  Recent Labs  08/25/16 2143 08/26/16 0800 08/26/16 1041  GLUCAP 236* 151* 134*    Procedures and Imaging Studies During Stay: Dg Chest 2 View  Result Date: 08/23/2016 CLINICAL DATA:  Shortness of breath, chest pain. EXAM: CHEST  2 VIEW COMPARISON:  Radiographs of Aug 20, 2016. FINDINGS: Stable cardiomediastinal silhouette. Atherosclerosis of thoracic aorta is noted. No pneumothorax or pleural effusion is noted. No acute pulmonary disease is noted. What appears to be a radiographic marker is seen projected over the soft tissues of the right axillary region. No corresponding marker is seen on the left side. Nodular density seen in right lung on prior exam is not visualized currently. Bony thorax is unremarkable. IMPRESSION: No active cardiopulmonary disease. Aortic atherosclerosis. Nodular density seen in right lung on prior exam is not visualized currently. Electronically Signed   By: Lupita Raider, M.D.   On: 08/23/2016 09:38    Assessment/Plan:   Chronic respiratory failure with hypoxia (HCC)  Shortness of breath  Third degree heart block (HCC)  Obstructive sleep apnea  Esophageal dysphagia  Chronic kidney disease (CKD), stage III (moderate)  Personal  history of DVT (deep vein thrombosis)  Hyperlipidemia, unspecified hyperlipidemia type  Type 2 diabetes mellitus with chronic kidney disease, with long-term current use of insulin, unspecified CKD stage (HCC)      CHRONIC RESP FAILURE WITH HYPOXIA is anew dx this admission- meets O2 criteria; cause unknown, will be w/u as outpt; We have tried pt on albuterol nebs for SOB and they were found to be insufficient.OSA may be contributing, certainly needs to be treated   Patient is being discharged with the following home health services:  OT/PT/Nursing   Patient is being discharged with the following durable medical equipment:  CPAP for OSA  dnd O2  for chronic resp failure with hypoxia  Patient has been advised to f/u with their PCP in 1-2 weeks to bring them up to date on their rehab stay.  Social services at facility was responsible for arranging this appointment.  Pt was provided with a 30 day supply of prescriptions for medications and refills must be obtained from their PCP.  For controlled substances, a more limited supply may be provided adequate until PCP appointment only.  Medications have been reconciled.  Time spent > 30 min;> 50% of time with patient was spent reviewing records, labs, tests and studies, counseling and developing plan of care  Randon Goldsmith. Lyn Hollingshead, MD

## 2016-09-21 DIAGNOSIS — J9611 Chronic respiratory failure with hypoxia: Secondary | ICD-10-CM | POA: Insufficient documentation

## 2016-10-02 NOTE — Progress Notes (Signed)
Cardiology Office Note    Date:  10/03/2016   ID:  Joshua Dalton, DOB 1940/09/03, MRN [id number]  PCP:  System, Pcp Not In  Cardiologist: Dr. Johney Frame   Chief Complaint  Patient presents with  . Hospitalization Follow-up    History of Present Illness:    Joshua Dalton is a 76 y.o. male with past medical history of HTN, Type 2 DM, OSA, history of DVT and Stage 3 CKD who presents to the office today for hospital follow-up.   He was recently admitted from 5/20 - 08/26/2016 for worsening dyspnea. Echo showed a preserved EF of 60-65% with no regional WMA. Grade 1 DD along with mild AI was noted. He also reported dysphagia, therefore GI was consulted and an EGD was performed which showed gastritis. EP was consulted due to him having episodes of nocturnal heart block with P-P and P-R prolongation with him denying any recent dizziness or presyncope. This occurred in the setting of using a CPAP for OSA. There was no distinct indication for PPM placement, therefore a 30-day event monitor was recommended once he returned to [place].   In talking with the patient today, he reports having continued dyspnea on exertion since his recent hospitalization. He's been using 2 L nasal cannula at home but did not bring this with him today. Oxygen saturations are checked today and remain at 96%.  He denies any recent chest pain or palpitations. No lightheadedness, dizziness, or presyncope. He denies any orthopnea or PND. Has continued to experience lower extremity edema and reports weight has increased over 5 lbs since he was discharged from [facility] on 09/21/2016.  He does not check his BP regularly but it is soft at 91/56. Reports taking his Lisinopril two hours prior to his appointment time.    Past Medical History:  Diagnosis Date  . Asthma   . Chronic diastolic (congestive) heart failure (HCC)    a. echo in 08/2016 showed a preserved EF of 60-65%, Grade 1 DD, and no WMA. Mild AI  noted.   . Chronic kidney disease, stage III (moderate)   . Diabetes mellitus without complication (HCC)   . Heart disease   . Hypercholesteremia   . Hypertension   . Obstructive sleep apnea   . Personal history of venous thrombosis and embolism     Past Surgical History:  Procedure Laterality Date  . CARDIAC SURGERY    . ESOPHAGOGASTRODUODENOSCOPY (EGD) WITH PROPOFOL N/A 08/26/2016   Procedure: ESOPHAGOGASTRODUODENOSCOPY (EGD) WITH PROPOFOL;  Surgeon: Kathi Der, MD;  [facility];  Service: Gastroenterology;  Laterality: N/A;    Current Medications: Outpatient Medications Prior to Visit  Medication Sig Dispense Refill  . allopurinol (ZYLOPRIM) 300 MG tablet Take 300 mg by mouth daily.    . fenofibrate (TRICOR) 145 MG tablet Take 145 mg by mouth daily.    . fexofenadine (ALLEGRA) 180 MG tablet Take 180 mg by mouth daily.    . finasteride (PROSCAR) 5 MG tablet Take 5 mg by mouth daily.    Marland Kitchen FLUoxetine (PROZAC) 20 MG capsule Take 20 mg by mouth daily.    . insulin aspart (NOVOLOG) 100 UNIT/ML injection Inject 20 Units into the skin 3 (three) times daily before meals.     . insulin glargine (LANTUS) 100 UNIT/ML injection Inject 70 Units into the skin at bedtime.    . nitroGLYCERIN (NITROSTAT) 0.4 MG SL tablet Place 0.4 mg under the tongue every 5 (five) minutes as needed for chest pain.    Marland Kitchen  oxybutynin (DITROPAN-XL) 10 MG 24 hr tablet Take 10 mg by mouth at bedtime.    . pantoprazole (PROTONIX) 40 MG tablet Take 1 tablet (40 mg total) by mouth daily. 30 tablet 1  . polyethylene glycol (MIRALAX / GLYCOLAX) packet Take 17 g by mouth 2 (two) times daily.    . pravastatin (PRAVACHOL) 80 MG tablet Take 80 mg by mouth daily.    . dabigatran (PRADAXA) 150 MG CAPS capsule Take 150 mg by mouth 2 (two) times daily.    . furosemide (LASIX) 40 MG tablet Take 40 mg by mouth daily.    Marland Kitchen. lisinopril (PRINIVIL,ZESTRIL) 10 MG tablet Take 10 mg by mouth daily.    . tamsulosin (FLOMAX)  0.4 MG CAPS capsule Take 0.4 mg by mouth daily.      No facility-administered medications prior to visit.      Allergies:   Patient has no known allergies.   Social History   Social History  . Marital status: Widowed    Spouse name: N/A  . Number of children: N/A  . Years of education: N/A   Social History Main Topics  . Smoking status: Former Games developermoker  . Smokeless tobacco: Never Used  . Alcohol use No  . Drug use: Unknown  . Sexual activity: Not Asked   Other Topics Concern  . None   Social History Narrative  . None     Family History:  The patient's family history is not on file.   Review of Systems:   Please see the history of present illness.     General:  No chills, fever, night sweats or weight changes.  Cardiovascular:  No chest pain, orthopnea, palpitations, paroxysmal nocturnal dyspnea. Positive for dyspnea on exertion and lower extremity edema.  Dermatological: No rash, lesions/masses Respiratory: No cough, dyspnea Urologic: No hematuria, dysuria Abdominal:   No nausea, vomiting, diarrhea, bright red blood per rectum, melena, or hematemesis Neurologic:  No visual changes, wkns, changes in mental status. All other systems reviewed and are otherwise negative except as noted above.   Physical Exam:    VS:  BP (!) 91/56   Pulse 71   Ht 5\' 6"  (1.676 m)   Wt 252 lb (114.3 kg)   BMI 40.67 kg/m    General: Well developed, overweight Caucasian male appearing in no acute distress. Head: Normocephalic, atraumatic, sclera non-icteric, no xanthomas, nares are without discharge.  Neck: No carotid bruits. JVD not elevated.  Lungs: Respirations regular and unlabored, without wheezes or rales.  Heart: Regular rate and rhythm. No S3 or S4.  No murmur, no rubs, or gallops appreciated. Abdomen: Soft, non-tender, non-distended with normoactive bowel sounds. No hepatomegaly. No rebound/guarding. No obvious abdominal masses. Msk:  Strength and tone appear normal for age. No  joint deformities or effusions. Extremities: No clubbing or cyanosis. 2+ pitting edema up to mid-shins bilaterally.  Distal pedal pulses are 2+ bilaterally. Neuro: Alert and oriented X 3. Moves all extremities spontaneously. No focal deficits noted. Psych:  Responds to questions appropriately with a normal affect. Skin: No rashes or lesions noted  Wt Readings from Last 3 Encounters:  10/03/16 252 lb (114.3 kg)  09/19/16 251 lb (113.9 kg)  08/29/16 251 lb (113.9 kg)     Studies/Labs Reviewed:   EKG:  EKG is not ordered today.    Recent Labs: 08/20/2016: B Natriuretic Peptide 24.4 08/21/2016: TSH 3.016 08/26/2016: BUN 32; Creatinine, Ser 2.41; Hemoglobin 15.7; Platelets 238; Potassium 3.9; Sodium 138   Lipid Panel No results  found for: CHOL, TRIG, HDL, CHOLHDL, VLDL, LDLCALC, LDLDIRECT  Additional studies/ records that were reviewed today include:   Echocardiogram: 08/21/2016 Study Conclusions  - Left ventricle: The cavity size was normal. Wall thickness was   increased in a pattern of mild LVH. Left ventricular geometry   showed evidence of concentric remodeling. Systolic function was   normal. The estimated ejection fraction was in the range of 60%   to 65%. Wall motion was normal; there were no regional wall   motion abnormalities. Doppler parameters are consistent with   abnormal left ventricular relaxation (grade 1 diastolic   dysfunction). Doppler parameters are consistent with elevated   mean left atrial filling pressure. - Aortic valve: There was mild regurgitation.  Assessment:    1. Chronic diastolic heart failure (HCC)   2. Heart block   3. Encounter for therapeutic drug monitoring   4. Obstructive sleep apnea   5. Essential hypertension   6. Personal history of DVT (deep vein thrombosis)   7. Chronic kidney disease (CKD), stage III (moderate)      Plan:   In order of problems listed above:  1. Chronic Diastolic CHF - Echo in 08/2016 showed a preserved  EF of 60-65% with no regional WMA. Reports having a NST in 05/2016 while in [place], [place] which was normal (will need to obtain these records if he plans to remain in [place], [place]).  - he denies any associated chest pain or palpitations. Oxygen saturations stable at 96% today. He is using 2L [place] as needed but is not requiring this today. - he has experienced a 5 lb weight gain since discharge from SNF. Lungs are clear on examination today but he does have 2+ pitting edema up to his mid-shins. Will increase Lasix to 40mg  AM and 20mg  in the PM. Recheck BMET today. He is being followed by Dr. Darleene Cleaver and according to the patient he can have repeat lab draws during his home visits.   2. Nocturnal Heart Block - noted to have episodes of nocturnal heart block during his recent admission with P-P and P-R prolongation. He denied any recent dizziness or presyncope and these episodes occurred in the setting of using a CPAP for OSA. - he is planning to reside in [place] at this time and not return to [place]. Will order a 30-day cardiac event monitor as recommended by EP during his recent admission.    3. OSA - continued compliance with CPAP encouraged.    4. HTN - he is actually hypotensive at 91/56 during today's visit.  - will reduce Lisinopril from 10mg  daily to 5mg  daily to allow for further titration of his diuretic as above.   5. Prior DVT - he remains on Pradaxa 150mg  BID. Denies any evidence of active bleeding.   6. Stage 3 CKD - creatinine at 2.2 - 2.4 during recent hospitalization.  - recheck BMET today.   Medication Adjustments/Labs and Tests Ordered: Current medicines are reviewed at length with the patient today.  Concerns regarding medicines are outlined above.  Medication changes, Labs and Tests ordered today are listed in the Patient Instructions below. Patient Instructions  Medication Instructions:  DECREASE LISINOPRIL TO 5 MG DAILY  INCREASE FUROSEMIDE TO 40 MG IN THE  MORNING AND 20 MG IN THE EVENING   Labwork: BMET TODAY   Testing/Procedures: Your physician has recommended that you wear an event monitor. Event monitors are medical devices that record the heart's electrical activity. Doctors most often [place] these monitors to diagnose  arrhythmias. Arrhythmias are problems with the speed or rhythm of the heartbeat. The monitor is a small, portable device. You can wear one while you do your normal daily activities. This is usually used to diagnose what is causing palpitations/syncope (passing out). [facility] AT [address]  Follow-Up: Your physician recommends that you schedule a follow-up appointment in: 2 MONTHS WITH DR Johney Frame   If you need a refill on your cardiac medications before your next appointment, please call your pharmacy.   Signed, Ellsworth Lennox, PA-C  10/03/2016 7:58 PM    [facility] [address] [place], [address] [place], [place]  [address] Phone: [phone]; Fax: [phone]  [address] [place], [place] [address] Phone: [phone]

## 2016-10-03 ENCOUNTER — Ambulatory Visit (INDEPENDENT_AMBULATORY_CARE_PROVIDER_SITE_OTHER): Payer: Medicare Other | Admitting: Student

## 2016-10-03 ENCOUNTER — Encounter: Payer: Self-pay | Admitting: Student

## 2016-10-03 VITALS — BP 91/56 | HR 71 | Ht 66.0 in | Wt 252.0 lb

## 2016-10-03 DIAGNOSIS — G4733 Obstructive sleep apnea (adult) (pediatric): Secondary | ICD-10-CM

## 2016-10-03 DIAGNOSIS — N183 Chronic kidney disease, stage 3 unspecified: Secondary | ICD-10-CM

## 2016-10-03 DIAGNOSIS — I459 Conduction disorder, unspecified: Secondary | ICD-10-CM

## 2016-10-03 DIAGNOSIS — Z86718 Personal history of other venous thrombosis and embolism: Secondary | ICD-10-CM

## 2016-10-03 DIAGNOSIS — Z5181 Encounter for therapeutic drug level monitoring: Secondary | ICD-10-CM | POA: Diagnosis not present

## 2016-10-03 DIAGNOSIS — I10 Essential (primary) hypertension: Secondary | ICD-10-CM

## 2016-10-03 DIAGNOSIS — I5032 Chronic diastolic (congestive) heart failure: Secondary | ICD-10-CM

## 2016-10-03 MED ORDER — DABIGATRAN ETEXILATE MESYLATE 150 MG PO CAPS: 150.0000 mg | ORAL_CAPSULE | Freq: Two times a day (BID) | ORAL | 5 refills | Status: DC

## 2016-10-03 MED ORDER — LISINOPRIL 5 MG PO TABS: 5.0000 mg | ORAL_TABLET | Freq: Every day | ORAL | 5 refills | Status: DC

## 2016-10-03 MED ORDER — FUROSEMIDE 40 MG PO TABS: ORAL_TABLET | ORAL | 5 refills | Status: DC

## 2016-10-03 NOTE — Patient Instructions (Addendum)
Medication Instructions:  DECREASE LISINOPRIL TO 5 MG DAILY  INCREASE FUROSEMIDE TO 40 MG IN THE MORNING AND 20 MG IN THE EVENING   Labwork: BMET TODAY   Testing/Procedures: Your physician has recommended that you wear an event monitor. Event monitors are medical devices that record the heart's electrical activity. Doctors most often us these monitors to diagnose arrhythmias. Arrhythmias are problems with the speed or rhythm of the heartbeat. The monitor is a small, portable device. You can wear one while you do your normal daily activities. This is usually used to diagnose what is causing palpitations/syncope (passing out). [facility] AT [address]  Follow-Up: Your physician recommends that you schedule a follow-up appointment in: 2 MONTHS WITH DR Johney FrameALLRED   If you need a refill on your cardiac medications before your next appointment, please call your pharmacy.

## 2016-10-04 LAB — BASIC METABOLIC PANEL
BUN / CREAT RATIO: 14 (ref 10–24)
BUN: 32 mg/dL — ABNORMAL HIGH (ref 8–27)
CO2: 24 mmol/L (ref 20–29)
Calcium: 10.3 mg/dL — ABNORMAL HIGH (ref 8.6–10.2)
Chloride: 103 mmol/L (ref 96–106)
Creatinine, Ser: 2.27 mg/dL — ABNORMAL HIGH (ref 0.76–1.27)
GFR, EST AFRICAN AMERICAN: 31 mL/min/{1.73_m2} — AB (ref >59)
GFR, EST NON AFRICAN AMERICAN: 27 mL/min/{1.73_m2} — AB (ref >59)
Glucose: 127 mg/dL — ABNORMAL HIGH (ref 65–99)
POTASSIUM: 4.6 mmol/L (ref 3.5–5.2)
SODIUM: 146 mmol/L — AB (ref 134–144)

## 2016-10-11 ENCOUNTER — Other Ambulatory Visit: Payer: Self-pay | Admitting: Student

## 2016-10-11 DIAGNOSIS — I440 Atrioventricular block, first degree: Secondary | ICD-10-CM

## 2016-10-12 ENCOUNTER — Ambulatory Visit (INDEPENDENT_AMBULATORY_CARE_PROVIDER_SITE_OTHER): Payer: Medicare Other

## 2016-10-12 DIAGNOSIS — I440 Atrioventricular block, first degree: Secondary | ICD-10-CM | POA: Diagnosis not present

## 2016-10-19 ENCOUNTER — Other Ambulatory Visit: Payer: Self-pay | Admitting: Internal Medicine

## 2016-11-09 ENCOUNTER — Other Ambulatory Visit: Payer: Self-pay | Admitting: Internal Medicine

## 2016-11-18 ENCOUNTER — Other Ambulatory Visit: Payer: Self-pay | Admitting: Internal Medicine

## 2016-12-06 ENCOUNTER — Ambulatory Visit (INDEPENDENT_AMBULATORY_CARE_PROVIDER_SITE_OTHER): Payer: Medicare Other | Admitting: Internal Medicine

## 2016-12-06 ENCOUNTER — Encounter: Payer: Self-pay | Admitting: Internal Medicine

## 2016-12-06 VITALS — BP 112/70 | HR 78 | Ht 66.0 in | Wt 245.0 lb

## 2016-12-06 DIAGNOSIS — I5032 Chronic diastolic (congestive) heart failure: Secondary | ICD-10-CM

## 2016-12-06 DIAGNOSIS — G4733 Obstructive sleep apnea (adult) (pediatric): Secondary | ICD-10-CM | POA: Diagnosis not present

## 2016-12-06 DIAGNOSIS — I441 Atrioventricular block, second degree: Secondary | ICD-10-CM

## 2016-12-06 DIAGNOSIS — I10 Essential (primary) hypertension: Secondary | ICD-10-CM | POA: Diagnosis not present

## 2016-12-06 NOTE — Patient Instructions (Addendum)
Medication Instructions:  Your physician recommends that you continue on your current medications as directed. Please refer to the Current Medication list given to you today.   Labwork: None ordered   Testing/Procedures: None ordered   Follow-Up: Your physician recommends that you schedule a follow-up appointment as needed with Dr. Johney FrameAllred  Your physician recommends that you schedule a follow-up appointment in: 2-3 months with Dr Dulce [place]un[place] [facility] [place] to establish for general cardiology   Thank you for choosing [facility]!!     Dennis BastKelly Lanier, RN [phone]

## 2016-12-06 NOTE — Progress Notes (Signed)
PCP: Phineas RealShyamal, [place] Primary Cardiologist[place] [facility] [place] Primary EP: Dr Felix PaciniAllred  Locke Amador CunasWalter Dalton is a 76 y.o. male who presents today for routine electrophysiology followup.  I saw him in the hospital for consultation 08/25/16 (my note reviewed).  He had asymptomatic nocturnal bradycardia at that time.  Since last being seen in our clinic, the patient reports doing very well.  Edema is stable.  Not very active.  [place]  Today, he denies symptoms of palpitations, chest pain, shortness of breath,  dizziness, presyncope, or syncope.  The patient is otherwise without complaint today.   Past Medical History:  Diagnosis Date  . Asthma   . Chronic diastolic (congestive) heart failure (HCC)    a. echo in 08/2016 showed a preserved EF of 60-65%, Grade 1 DD, and no WMA. Mild AI noted.   . Chronic kidney disease, stage III (moderate)   . Diabetes mellitus without complication (HCC)   . Heart disease   . Hypercholesteremia   . Hypertension   . Obstructive sleep apnea   . Personal history of venous thrombosis and embolism    Past Surgical History:  Procedure Laterality Date  . CARDIAC SURGERY    . ESOPHAGOGASTRODUODENOSCOPY (EGD) WITH PROPOFOL N/A 08/26/2016   Procedure: ESOPHAGOGASTRODUODENOSCOPY (EGD) WITH PROPOFOL;  Surgeon: Kathi DerBrahmbhatt, Parag, MD;  [facility];  Service: Gastroenterology;  Laterality: N/A;    ROS- all systems are reviewed and negatives except as per HPI above  Current Outpatient Prescriptions  Medication Sig Dispense Refill  . allopurinol (ZYLOPRIM) 300 MG tablet Take 300 mg by mouth daily.    . dabigatran (PRADAXA) 150 MG CAPS capsule Take 1 capsule (150 mg total) by mouth 2 (two) times daily. 60 capsule 5  . fenofibrate (TRICOR) 145 MG tablet Take 145 mg by mouth daily.    . fexofenadine (ALLEGRA) 180 MG tablet Take 180 mg by mouth daily.    . finasteride (PROSCAR) 5 MG tablet Take 5 mg by mouth daily.    Marland Kitchen. FLUoxetine  (PROZAC) 20 MG capsule Take 20 mg by mouth daily.    . furosemide (LASIX) 40 MG tablet TAKE 1 TABLET BY MOUTH IN THE MORNING AND 1/2 IN THE EVENING 45 tablet 5  . insulin aspart (NOVOLOG) 100 UNIT/ML injection Inject 20 Units into the skin 3 (three) times daily before meals.     . insulin glargine (LANTUS) 100 UNIT/ML injection Inject 70 Units into the skin at bedtime.    Marland Kitchen. lisinopril (PRINIVIL,ZESTRIL) 5 MG tablet Take 1 tablet (5 mg total) by mouth daily. 30 tablet 5  . nitroGLYCERIN (NITROSTAT) 0.4 MG SL tablet Place 0.4 mg under the tongue every 5 (five) minutes as needed for chest pain.    Marland Kitchen. oxybutynin (DITROPAN-XL) 10 MG 24 hr tablet Take 10 mg by mouth at bedtime.    . pantoprazole (PROTONIX) 40 MG tablet Take 1 tablet (40 mg total) by mouth daily. 30 tablet 1  . polyethylene glycol (MIRALAX / GLYCOLAX) packet Take 17 g by mouth 2 (two) times daily.    . pravastatin (PRAVACHOL) 80 MG tablet Take 80 mg by mouth daily.    . tamsulosin (FLOMAX) 0.4 MG CAPS capsule      No current facility-administered medications for this visit.     Physical Exam: Vitals:   12/06/16 1015  BP: 112/70  Pulse: 78  SpO2: 91%  Weight: 245 lb (111.1 kg)  Height: 5\' 6"  (1.676 m)    GEN- The patient is  overweight and chronically ill appearing, alert and oriented x 3 today.   Head- normocephalic, atraumatic Eyes-  Sclera clear, conjunctiva pink Ears- hearing intact Oropharynx- clear Lungs- Clear to ausculation bilaterally, normal work of breathing Heart- Regular rate and rhythm, no murmurs, rubs or gallops, PMI not laterally displaced GI- soft, NT, ND, + BS Extremities- no clubbing, cyanosis, +1 edema  Event monitor is reviewed today and reveals sinus rhythm with first degree AV block.  There is rare nocturnal mobitz I second degree AV block. I do not see mobitz II or more advanced AV block.  No prolonged pauses or symptomatic bradycardias  Assessment and Plan:  1. Nocturnal bradycardia Doing well,  without symptoms Recent event monitor is reviewed as above. No indication for pacing at this time If he develops symptoms of presyncope, syncope, SOB, fatigue, dizziness, etc, he should contact our office. Prior syncope is of unknown etiology.  I suspect vasovagal.  No driving x 6 months post syncope as we do not know for sure the cause.  His daughter worries about his ability to drive long term due to unsteadiness and fragility.  I have encouraged him to listen to his daughter and rely on her to help guide his decision about whether or not it will be safe to drive again in the future.  2. Chronic diastolic dysfunction euvolemic Stable No change required today  3. OSA Uses CPAP  4. HTN Stable No change required today  5. Prior DVT--> occurred in the setting of knee surgery (provoked) He has been treated for more than 6 months Daughter states that his PCP and pulmonary specialist would like to stop pradaxa. I am not aware of any history of afib.  I will therefore defer to their judgement.  I did advise that they obtain records from his prior cardiologist in [facility] who know him well to make sure that he has no history of afib before stopping pradaxa.   I will see as needed going forward. Will refer to Dr Dulce Sellar with [facility] [place] office to establish long term cardiology follow-up  Hillis Range MD, [facility] 12/06/2016 10:41 AM

## 2016-12-18 ENCOUNTER — Other Ambulatory Visit: Payer: Self-pay | Admitting: Internal Medicine

## 2017-01-09 ENCOUNTER — Encounter: Payer: Self-pay | Admitting: Podiatry

## 2017-01-09 ENCOUNTER — Ambulatory Visit (INDEPENDENT_AMBULATORY_CARE_PROVIDER_SITE_OTHER): Payer: Medicare Other | Admitting: Podiatry

## 2017-01-09 VITALS — BP 131/71 | HR 94 | Ht 66.0 in | Wt 241.0 lb

## 2017-01-09 DIAGNOSIS — M79671 Pain in right foot: Secondary | ICD-10-CM

## 2017-01-09 DIAGNOSIS — L03031 Cellulitis of right toe: Secondary | ICD-10-CM | POA: Diagnosis not present

## 2017-01-09 DIAGNOSIS — L600 Ingrowing nail: Secondary | ICD-10-CM

## 2017-01-09 NOTE — Patient Instructions (Signed)
Seen for painful ingrown nail right great toe medial border. Ingrown nail surgery done. Follow soaking instruction. Return in one week.

## 2017-01-09 NOTE — Progress Notes (Signed)
SUBJECTIVE: 76 y.o. year old male presents with a care taker complaining of sore toe on right great toe. Stated that the has been sore for over a month and had a difficulty getting an appointment. Stated that his blood sugar is under control with consistent reading below 140.  He lives at [facility] assisted living facility.  REVIEW OF SYSTEMS: Review of Systems  Constitutional: Negative for chills, diaphoresis, fever and weight loss.  Eyes: Positive for blurred vision.  Respiratory: Positive for shortness of breath.   Cardiovascular: Negative for chest pain, palpitations and orthopnea.  Gastrointestinal: Negative for heartburn, nausea and vomiting.  Genitourinary: Negative for dysuria, frequency and urgency.  Neurological: Negative for dizziness, tingling, tremors and headaches.  Currently under medical management for CHF, constricted esophagus with difficulty swallowing. Recent history of right ankle injury in May 2018 and cracked right lower rib 2 weeks ago and no problem with ambulation and getting around. History of knee surgery right 2016.   OBJECTIVE: DERMATOLOGIC EXAMINATION: Thick dystrophic hallucal nails. Ingrown nail with inflamed ungual labia and drainage medial border right great toe.  Poor skin texture with irregular indentation left lower shin as a result of a fall. No open skin or drainage or associated erythema noted. VASCULAR EXAMINATION OF LOWER LIMBS: Dorsalis Pedis artery: Palpable on both feet. Posterior Tibial artery: Not palpable.  Capillary Filling times within 3 seconds in all digits.  Edema: mild lower limb edema bilateral. NEUROLOGIC EXAMINATION OF THE LOWER LIMBS: Achilles DTR is present and within normal. Monofilament (Semmes-Weinstein 10-gm) sensory testing positive 6 out of 6, bilateral. Vibratory sensations(128Hz  turning fork) intact at medial and lateral forefoot bilateral.  Sharp and Dull discriminatory sensations at the plantar ball of hallux is  intact bilateral.   MUSCULOSKELETAL EXAMINATION: Positive for high arched cavus type foot bilateral.  ASSESSMENT: Infected ingrown nail right great toe medial border.  PLAN: Reviewed findings and available treatment options. Patient requested the toe be fixed today.  Procedure done: Phenol and Alcohol Matrixectomy right great toe medial border under local anesthesia.. Affected right great toe was anesthetized with total 5ml mixture of 50/50 0.5% Marcaine plain and 1% Xylocaine plain. Affected medial nail border was reflected with a nail elevator and excised with nail nipper. Proximal nail matrix tissue was cauterized with Phenol soaked cotton applicator x 4 and neutralized with Alcohol soaked cotton applicator. The wound was dressed with Amerigel ointment dressing. Home care instructions and supply dispensed.  Return in 1 week for follow up.

## 2017-01-11 ENCOUNTER — Ambulatory Visit: Payer: Medicare Other | Admitting: Podiatry

## 2017-01-16 ENCOUNTER — Ambulatory Visit (INDEPENDENT_AMBULATORY_CARE_PROVIDER_SITE_OTHER): Payer: Medicare Other | Admitting: Podiatry

## 2017-01-16 ENCOUNTER — Encounter: Payer: Self-pay | Admitting: Podiatry

## 2017-01-16 DIAGNOSIS — Z9889 Other specified postprocedural states: Secondary | ICD-10-CM

## 2017-01-16 NOTE — Progress Notes (Signed)
1 week follow up on right great toe nail surgery medial border. Denies any problem with the toe. Mild erythema over medial border of right great toe. No drainage noted. Continue soaking till pain and redness subside. Return as needed.

## 2017-01-16 NOTE — Patient Instructions (Signed)
1 week post op wound healing normal on right great toe medial border. Continue soaking till redness and pain subside. Return as needed.

## 2017-01-26 ENCOUNTER — Emergency Department (HOSPITAL_BASED_OUTPATIENT_CLINIC_OR_DEPARTMENT_OTHER): Payer: Medicare Other

## 2017-01-26 ENCOUNTER — Inpatient Hospital Stay (HOSPITAL_BASED_OUTPATIENT_CLINIC_OR_DEPARTMENT_OTHER)
Admission: EM | Admit: 2017-01-26 | Discharge: 2017-01-30 | DRG: 176 | Disposition: A | Discharge status: Home / Self Care | Payer: Medicare Other | Attending: Internal Medicine | Admitting: Internal Medicine

## 2017-01-26 ENCOUNTER — Encounter (HOSPITAL_BASED_OUTPATIENT_CLINIC_OR_DEPARTMENT_OTHER): Payer: Self-pay

## 2017-01-26 ENCOUNTER — Other Ambulatory Visit: Payer: Self-pay

## 2017-01-26 DIAGNOSIS — N183 Chronic kidney disease, stage 3 unspecified: Secondary | ICD-10-CM | POA: Diagnosis present

## 2017-01-26 DIAGNOSIS — I82409 Acute embolism and thrombosis of unspecified deep veins of unspecified lower extremity: Secondary | ICD-10-CM | POA: Diagnosis present

## 2017-01-26 DIAGNOSIS — E1122 Type 2 diabetes mellitus with diabetic chronic kidney disease: Secondary | ICD-10-CM | POA: Diagnosis not present

## 2017-01-26 DIAGNOSIS — Z23 Encounter for immunization: Secondary | ICD-10-CM

## 2017-01-26 DIAGNOSIS — I4510 Unspecified right bundle-branch block: Secondary | ICD-10-CM | POA: Diagnosis present

## 2017-01-26 DIAGNOSIS — I824Y1 Acute embolism and thrombosis of unspecified deep veins of right proximal lower extremity: Secondary | ICD-10-CM | POA: Diagnosis not present

## 2017-01-26 DIAGNOSIS — E876 Hypokalemia: Secondary | ICD-10-CM | POA: Diagnosis present

## 2017-01-26 DIAGNOSIS — R0602 Shortness of breath: Secondary | ICD-10-CM

## 2017-01-26 DIAGNOSIS — I82491 Acute embolism and thrombosis of other specified deep vein of right lower extremity: Secondary | ICD-10-CM | POA: Diagnosis present

## 2017-01-26 DIAGNOSIS — R0600 Dyspnea, unspecified: Secondary | ICD-10-CM | POA: Diagnosis not present

## 2017-01-26 DIAGNOSIS — E11649 Type 2 diabetes mellitus with hypoglycemia without coma: Secondary | ICD-10-CM | POA: Diagnosis not present

## 2017-01-26 DIAGNOSIS — I82431 Acute embolism and thrombosis of right popliteal vein: Secondary | ICD-10-CM | POA: Diagnosis present

## 2017-01-26 DIAGNOSIS — I5032 Chronic diastolic (congestive) heart failure: Secondary | ICD-10-CM | POA: Diagnosis present

## 2017-01-26 DIAGNOSIS — I82411 Acute embolism and thrombosis of right femoral vein: Secondary | ICD-10-CM | POA: Diagnosis present

## 2017-01-26 DIAGNOSIS — I10 Essential (primary) hypertension: Secondary | ICD-10-CM | POA: Diagnosis not present

## 2017-01-26 DIAGNOSIS — Z794 Long term (current) use of insulin: Secondary | ICD-10-CM | POA: Diagnosis not present

## 2017-01-26 DIAGNOSIS — E7800 Pure hypercholesterolemia, unspecified: Secondary | ICD-10-CM | POA: Diagnosis present

## 2017-01-26 DIAGNOSIS — E785 Hyperlipidemia, unspecified: Secondary | ICD-10-CM | POA: Diagnosis present

## 2017-01-26 DIAGNOSIS — Z86718 Personal history of other venous thrombosis and embolism: Secondary | ICD-10-CM

## 2017-01-26 DIAGNOSIS — I2699 Other pulmonary embolism without acute cor pulmonale: Secondary | ICD-10-CM | POA: Diagnosis not present

## 2017-01-26 DIAGNOSIS — Z96651 Presence of right artificial knee joint: Secondary | ICD-10-CM | POA: Diagnosis present

## 2017-01-26 DIAGNOSIS — G4733 Obstructive sleep apnea (adult) (pediatric): Secondary | ICD-10-CM | POA: Diagnosis present

## 2017-01-26 DIAGNOSIS — E1165 Type 2 diabetes mellitus with hyperglycemia: Secondary | ICD-10-CM | POA: Diagnosis present

## 2017-01-26 DIAGNOSIS — I130 Hypertensive heart and chronic kidney disease with heart failure and stage 1 through stage 4 chronic kidney disease, or unspecified chronic kidney disease: Secondary | ICD-10-CM | POA: Diagnosis present

## 2017-01-26 DIAGNOSIS — Z87891 Personal history of nicotine dependence: Secondary | ICD-10-CM

## 2017-01-26 DIAGNOSIS — N184 Chronic kidney disease, stage 4 (severe): Secondary | ICD-10-CM | POA: Diagnosis present

## 2017-01-26 DIAGNOSIS — Z8249 Family history of ischemic heart disease and other diseases of the circulatory system: Secondary | ICD-10-CM

## 2017-01-26 DIAGNOSIS — I441 Atrioventricular block, second degree: Secondary | ICD-10-CM | POA: Diagnosis present

## 2017-01-26 DIAGNOSIS — E119 Type 2 diabetes mellitus without complications: Secondary | ICD-10-CM

## 2017-01-26 LAB — COMPREHENSIVE METABOLIC PANEL
ALT: 12 U/L — AB (ref 17–63)
AST: 21 U/L (ref 15–41)
Albumin: 3.7 g/dL (ref 3.5–5.0)
Alkaline Phosphatase: 109 U/L (ref 38–126)
Anion gap: 11 (ref 5–15)
BILIRUBIN TOTAL: 1.2 mg/dL (ref 0.3–1.2)
BUN: 36 mg/dL — AB (ref 6–20)
CO2: 25 mmol/L (ref 22–32)
CREATININE: 2.4 mg/dL — AB (ref 0.61–1.24)
Calcium: 9.7 mg/dL (ref 8.9–10.3)
Chloride: 102 mmol/L (ref 101–111)
GFR calc Af Amer: 28 mL/min — ABNORMAL LOW (ref >60)
GFR, EST NON AFRICAN AMERICAN: 24 mL/min — AB (ref >60)
Glucose, Bld: 139 mg/dL — ABNORMAL HIGH (ref 65–99)
Potassium: 3.3 mmol/L — ABNORMAL LOW (ref 3.5–5.1)
Sodium: 138 mmol/L (ref 135–145)
TOTAL PROTEIN: 7.7 g/dL (ref 6.5–8.1)

## 2017-01-26 LAB — I-STAT CG4 LACTIC ACID, ED
Lactic Acid, Venous: 2.78 mmol/L (ref 0.5–1.9)
Lactic Acid, Venous: 4.17 mmol/L (ref 0.5–1.9)

## 2017-01-26 LAB — GLUCOSE, CAPILLARY: GLUCOSE-CAPILLARY: 334 mg/dL — AB (ref 65–99)

## 2017-01-26 LAB — CBC
HCT: 45.4 % (ref 39.0–52.0)
Hemoglobin: 15.6 g/dL (ref 13.0–17.0)
MCH: 32.8 pg (ref 26.0–34.0)
MCHC: 34.4 g/dL (ref 30.0–36.0)
MCV: 95.6 fL (ref 78.0–100.0)
PLATELETS: 199 10*3/uL (ref 150–400)
RBC: 4.75 MIL/uL (ref 4.22–5.81)
RDW: 13.4 % (ref 11.5–15.5)
WBC: 11.3 10*3/uL — AB (ref 4.0–10.5)

## 2017-01-26 LAB — D-DIMER, QUANTITATIVE: D-Dimer, Quant: 13.76 ug/mL-FEU — ABNORMAL HIGH (ref 0.00–0.50)

## 2017-01-26 LAB — TROPONIN I
Troponin I: <0.03 ng/mL (ref <0.03)
Troponin I: <0.03 ng/mL (ref <0.03)

## 2017-01-26 LAB — BRAIN NATRIURETIC PEPTIDE: B NATRIURETIC PEPTIDE 5: 33 pg/mL (ref 0.0–100.0)

## 2017-01-26 LAB — CBG MONITORING, ED
Glucose-Capillary: 136 mg/dL — ABNORMAL HIGH (ref 65–99)
Glucose-Capillary: 84 mg/dL (ref 65–99)

## 2017-01-26 MED ORDER — INSULIN ASPART 100 UNIT/ML ~~LOC~~ SOLN: 10.0000 [IU] | Freq: Three times a day (TID) | SUBCUTANEOUS | Status: DC

## 2017-01-26 MED ORDER — ONDANSETRON HCL 4 MG/2ML IJ SOLN: 4.0000 mg | Freq: Four times a day (QID) | INTRAMUSCULAR | Status: DC | PRN

## 2017-01-26 MED ORDER — LEVOFLOXACIN IN D5W 500 MG/100ML IV SOLN
500.0000 mg | Freq: Once | INTRAVENOUS | Status: AC
  Administered 2017-01-26: 500 mg via INTRAVENOUS
  Filled 2017-01-26: qty 100

## 2017-01-26 MED ORDER — HEPARIN (PORCINE) IN NACL 100-0.45 UNIT/ML-% IJ SOLN
1400.0000 [IU]/h | INTRAMUSCULAR | Status: DC
  Administered 2017-01-26: 1000 [IU]/h via INTRAVENOUS
  Filled 2017-01-26 (×4): qty 250

## 2017-01-26 MED ORDER — SODIUM CHLORIDE 0.9 % IV BOLUS (SEPSIS)
500.0000 mL | Freq: Once | INTRAVENOUS | Status: AC
  Administered 2017-01-26: 500 mL via INTRAVENOUS

## 2017-01-26 MED ORDER — PANTOPRAZOLE SODIUM 40 MG PO TBEC
40.0000 mg | DELAYED_RELEASE_TABLET | Freq: Every day | ORAL | Status: DC
  Administered 2017-01-27 – 2017-01-30 (×4): 40 mg via ORAL
  Filled 2017-01-26 (×4): qty 1

## 2017-01-26 MED ORDER — MAGNESIUM SULFATE IN D5W 1-5 GM/100ML-% IV SOLN
1.0000 g | Freq: Once | INTRAVENOUS | Status: AC
Start: 2017-01-26 — End: 2017-01-27
  Administered 2017-01-26: 1 g via INTRAVENOUS
  Filled 2017-01-26: qty 100

## 2017-01-26 MED ORDER — HEPARIN BOLUS VIA INFUSION
4000.0000 [IU] | Freq: Once | INTRAVENOUS | Status: AC
  Administered 2017-01-26: 4000 [IU] via INTRAVENOUS

## 2017-01-26 MED ORDER — ALBUTEROL SULFATE (2.5 MG/3ML) 0.083% IN NEBU
5.0000 mg | INHALATION_SOLUTION | Freq: Once | RESPIRATORY_TRACT | Status: AC
  Administered 2017-01-26: 5 mg via RESPIRATORY_TRACT
  Filled 2017-01-26: qty 6

## 2017-01-26 MED ORDER — FINASTERIDE 5 MG PO TABS
5.0000 mg | ORAL_TABLET | Freq: Every day | ORAL | Status: DC
  Administered 2017-01-27 – 2017-01-30 (×4): 5 mg via ORAL
  Filled 2017-01-26 (×4): qty 1

## 2017-01-26 MED ORDER — PRAVASTATIN SODIUM 40 MG PO TABS
80.0000 mg | ORAL_TABLET | Freq: Every day | ORAL | Status: DC
  Administered 2017-01-27 – 2017-01-29 (×3): 80 mg via ORAL
  Filled 2017-01-26 (×4): qty 2

## 2017-01-26 MED ORDER — INSULIN ASPART 100 UNIT/ML ~~LOC~~ SOLN
0.0000 [IU] | Freq: Every day | SUBCUTANEOUS | Status: DC
  Administered 2017-01-27: 4 [IU] via SUBCUTANEOUS

## 2017-01-26 MED ORDER — INSULIN ASPART 100 UNIT/ML ~~LOC~~ SOLN: 0.0000 [IU] | Freq: Three times a day (TID) | SUBCUTANEOUS | Status: DC

## 2017-01-26 MED ORDER — INSULIN GLARGINE 100 UNIT/ML ~~LOC~~ SOLN
70.0000 [IU] | Freq: Every day | SUBCUTANEOUS | Status: DC
  Administered 2017-01-26: 70 [IU] via SUBCUTANEOUS
  Filled 2017-01-26 (×2): qty 0.7

## 2017-01-26 MED ORDER — POTASSIUM CHLORIDE IN NACL 20-0.9 MEQ/L-% IV SOLN: INTRAVENOUS | Status: DC

## 2017-01-26 MED ORDER — FUROSEMIDE 40 MG PO TABS
40.0000 mg | ORAL_TABLET | Freq: Every day | ORAL | Status: DC
  Administered 2017-01-27 – 2017-01-30 (×4): 40 mg via ORAL
  Filled 2017-01-26 (×4): qty 1

## 2017-01-26 MED ORDER — ONDANSETRON HCL 4 MG PO TABS: 4.0000 mg | ORAL_TABLET | Freq: Four times a day (QID) | ORAL | Status: DC | PRN

## 2017-01-26 MED ORDER — ALLOPURINOL 300 MG PO TABS
300.0000 mg | ORAL_TABLET | Freq: Every day | ORAL | Status: DC
  Administered 2017-01-27 – 2017-01-30 (×4): 300 mg via ORAL
  Filled 2017-01-26 (×4): qty 1

## 2017-01-26 MED ORDER — ACETAMINOPHEN 325 MG PO TABS: 650.0000 mg | ORAL_TABLET | Freq: Four times a day (QID) | ORAL | Status: DC | PRN

## 2017-01-26 MED ORDER — TAMSULOSIN HCL 0.4 MG PO CAPS
0.4000 mg | ORAL_CAPSULE | Freq: Every day | ORAL | Status: DC
  Administered 2017-01-27 – 2017-01-30 (×4): 0.4 mg via ORAL
  Filled 2017-01-26 (×4): qty 1

## 2017-01-26 MED ORDER — ALBUTEROL SULFATE (2.5 MG/3ML) 0.083% IN NEBU: 2.5000 mg | INHALATION_SOLUTION | RESPIRATORY_TRACT | Status: DC | PRN

## 2017-01-26 MED ORDER — LORATADINE 10 MG PO TABS
10.0000 mg | ORAL_TABLET | Freq: Every day | ORAL | Status: DC
  Administered 2017-01-27 – 2017-01-30 (×4): 10 mg via ORAL
  Filled 2017-01-26 (×4): qty 1

## 2017-01-26 MED ORDER — ACETAMINOPHEN 650 MG RE SUPP: 650.0000 mg | Freq: Four times a day (QID) | RECTAL | Status: DC | PRN

## 2017-01-26 MED ORDER — POTASSIUM CHLORIDE CRYS ER 20 MEQ PO TBCR
20.0000 meq | EXTENDED_RELEASE_TABLET | Freq: Once | ORAL | Status: AC
  Administered 2017-01-26: 20 meq via ORAL
  Filled 2017-01-26: qty 1

## 2017-01-26 MED ORDER — SODIUM CHLORIDE 0.9% FLUSH
3.0000 mL | Freq: Two times a day (BID) | INTRAVENOUS | Status: DC
  Administered 2017-01-28 – 2017-01-29 (×2): 3 mL via INTRAVENOUS

## 2017-01-26 MED ORDER — OXYBUTYNIN CHLORIDE ER 10 MG PO TB24
10.0000 mg | ORAL_TABLET | Freq: Every day | ORAL | Status: DC
  Administered 2017-01-26 – 2017-01-29 (×4): 10 mg via ORAL
  Filled 2017-01-26 (×4): qty 1

## 2017-01-26 MED ORDER — LISINOPRIL 5 MG PO TABS
5.0000 mg | ORAL_TABLET | Freq: Every day | ORAL | Status: DC
  Administered 2017-01-27 – 2017-01-30 (×4): 5 mg via ORAL
  Filled 2017-01-26 (×4): qty 1

## 2017-01-26 MED ORDER — FENOFIBRATE 160 MG PO TABS
160.0000 mg | ORAL_TABLET | Freq: Every day | ORAL | Status: DC
  Administered 2017-01-27 – 2017-01-30 (×4): 160 mg via ORAL
  Filled 2017-01-26 (×4): qty 1

## 2017-01-26 MED ORDER — POLYETHYLENE GLYCOL 3350 17 G PO PACK
17.0000 g | PACK | Freq: Two times a day (BID) | ORAL | Status: DC
  Administered 2017-01-27 – 2017-01-29 (×4): 17 g via ORAL
  Filled 2017-01-26 (×6): qty 1

## 2017-01-26 MED ORDER — HYDROCODONE-ACETAMINOPHEN 5-325 MG PO TABS: 1.0000 | ORAL_TABLET | ORAL | Status: DC | PRN

## 2017-01-26 MED ORDER — FLUOXETINE HCL 20 MG PO CAPS
20.0000 mg | ORAL_CAPSULE | Freq: Every day | ORAL | Status: DC
  Administered 2017-01-27 – 2017-01-30 (×4): 20 mg via ORAL
  Filled 2017-01-26 (×4): qty 1

## 2017-01-26 MED ORDER — METHYLPREDNISOLONE SODIUM SUCC 125 MG IJ SOLR
125.0000 mg | Freq: Once | INTRAMUSCULAR | Status: AC
  Administered 2017-01-26: 125 mg via INTRAVENOUS
  Filled 2017-01-26: qty 2

## 2017-01-26 MED ORDER — INFLUENZA VAC SPLIT HIGH-DOSE 0.5 ML IM SUSY
0.5000 mL | PREFILLED_SYRINGE | INTRAMUSCULAR | Status: AC
  Administered 2017-01-28: 0.5 mL via INTRAMUSCULAR
  Filled 2017-01-26: qty 0.5

## 2017-01-26 NOTE — ED Notes (Signed)
Patient transported to Ultrasound 

## 2017-01-26 NOTE — ED Triage Notes (Signed)
C/o SOB x 1 week-pt was taken off blood thinners weeks ago-PCP advised pt's daughter to bring pt to ED for r/o "blood clot"-pt taken directly to ED room 3 via w/c

## 2017-01-26 NOTE — ED Notes (Signed)
Pt attempted to use urinal. No success at this time.

## 2017-01-26 NOTE — ED Notes (Signed)
returned from US

## 2017-01-26 NOTE — ED Notes (Signed)
ED Provider at bedside. 

## 2017-01-26 NOTE — H&P (Signed)
History and Physical    Joshua Dalton ZOX:[id number] DOB: 10-Feb-1941 DOA: 01/26/2017  PCP: Annita Brod, MD   Patient coming from: Home, by way of [facility]   Chief Complaint: SOB, cough  HPI: Joshua Dalton is a 76 y.o. male with medical history significant for hypertension, hyperlipidemia, obstructive sleep apnea, chronic diastolic CHF, chronic kidney disease stage III, and history of DVT, recently taken off of Pradaxa, now presenting to the emergency department for evaluation of shortness of breath.  Patient reports he was recently taken off Pradaxa and has developed progressive dyspnea over the past week.  He reports sputum production associated with this, but denies any chest pain or lower extremity edema or tenderness.  He has been getting extremely short of breath with exertion and first noted symptoms on 01/20/2017.  Patient was on Pradaxa following a right lower extremity DVT that occurred postoperatively from a right knee replacement.  He had been on the anticoagulant for 2 years before he was taken off approximately 10 days ago.  [facility] ED Course: Upon arrival to the ED, patient is found to be afebrile, saturating well on room air, tachypneic To 30, and with vitals otherwise stable.  EKG features a sinus rhythm with first degree AV nodal block and chronic RBBB.  Chest x-rays negative for acute cardiopulmonary disease.  Chemistry panel reveals a potassium of 3.3, BUN 36, and creatinine of 2.40.  CBC is notable for a leukocytosis to 11,300, troponin is undetectable, and BNP is normal.  Initial lactic acid is elevated to 2.78 and repeat level is elevated further to 4.17.  Lower extremity venous Dopplers were obtained and negative for clot in the left leg, but positive for acute DVT with extensive thrombus in the right common femoral, saphenous femoral junction, profunda, femoral, and popliteal veins.  Blood cultures were collected, Levaquin was administered,  500 cc normal saline was given, and patient was treated with albuterol neb, 125 mg IV Solu-Medrol, and started on IV heparin infusion.  Patient remained hemodynamically stable in the ED, has not been in acute respiratory distress, and will be observed on the telemetry unit for ongoing evaluation and management of dyspnea with acute DVT and suspected PE.  Review of Systems:  All other systems reviewed and apart from HPI, are negative.  Past Medical History:  Diagnosis Date  . Asthma   . Chronic diastolic (congestive) heart failure (HCC)    a. echo in 08/2016 showed a preserved EF of 60-65%, Grade 1 DD, and no WMA. Mild AI noted.   . Chronic kidney disease, stage III (moderate) (HCC)   . Diabetes mellitus without complication (HCC)   . Heart disease   . Hypercholesteremia   . Hypertension   . Obstructive sleep apnea   . Personal history of venous thrombosis and embolism     Past Surgical History:  Procedure Laterality Date  . CARDIAC SURGERY    . ESOPHAGOGASTRODUODENOSCOPY (EGD) WITH PROPOFOL N/A 08/26/2016   Procedure: ESOPHAGOGASTRODUODENOSCOPY (EGD) WITH PROPOFOL;  Surgeon: Kathi Der, MD;  Location: [facility];  Service: Gastroenterology;  Laterality: N/A;     reports that he has quit smoking. He has never used smokeless tobacco. He reports that he does not drink alcohol. His drug history is not on file.  No Known Allergies  History reviewed. No pertinent family history.   Prior to Admission medications   Medication Sig Start Date End Date Taking? Authorizing Provider  allopurinol (ZYLOPRIM) 300 MG tablet Take 300 mg by mouth  daily.    [provider]  fenofibrate (TRICOR) 145 MG tablet Take 145 mg by mouth daily.    [provider]  fexofenadine (ALLEGRA) 180 MG tablet Take 180 mg by mouth daily.    [provider]  finasteride (PROSCAR) 5 MG tablet Take 5 mg by mouth daily.    [provider]  FLUoxetine (PROZAC) 20 MG capsule  Take 20 mg by mouth daily.    [provider]  furosemide (LASIX) 40 MG tablet TAKE 1 TABLET BY MOUTH IN THE MORNING AND 1/2 IN THE EVENING 10/03/16   Strader, [place] M, PA-C  insulin aspart (NOVOLOG) 100 UNIT/ML injection Inject 20 Units into the skin 3 (three) times daily before meals.     [provider]  insulin glargine (LANTUS) 100 UNIT/ML injection Inject 70 Units into the skin at bedtime.    [provider]  lisinopril (PRINIVIL,ZESTRIL) 5 MG tablet Take 1 tablet (5 mg total) by mouth daily. 10/03/16   Strader, Lennart Pall, PA-C  nitroGLYCERIN (NITROSTAT) 0.4 MG SL tablet Place 0.4 mg under the tongue every 5 (five) minutes as needed for chest pain.    [provider]  oxybutynin (DITROPAN-XL) 10 MG 24 hr tablet Take 10 mg by mouth at bedtime.    [provider]  pantoprazole (PROTONIX) 40 MG tablet Take 1 tablet (40 mg total) by mouth daily. 08/26/16 08/26/17  Clydia Llano, MD  polyethylene glycol (MIRALAX / GLYCOLAX) packet Take 17 g by mouth 2 (two) times daily.    [provider]  pravastatin (PRAVACHOL) 80 MG tablet Take 80 mg by mouth daily.    [provider]  tamsulosin (FLOMAX) 0.4 MG CAPS capsule  08/20/16   [provider]    Physical Exam: Vitals:   01/26/17 1900 01/26/17 1930 01/26/17 2047 01/26/17 2227  BP: 113/61 130/71  (!) 116/53  Pulse: 67 86  95  Resp: 17 20  20   Temp:   99 F (37.2 C) 98.5 F (36.9 C)  TempSrc:   Oral Oral  SpO2: 100% 100%  94%  Weight:    107.3 kg (236 lb 9.6 oz)  Height:    5\' 6"  (1.676 m)      Constitutional: Dyspneic with speech, calm, comfortable Eyes: PERTLA, lids and conjunctivae normal ENMT: Mucous membranes are moist. Posterior pharynx clear of any exudate or lesions.   Neck: normal, supple, no masses, no thyromegaly Respiratory: clear to auscultation bilaterally, no wheezing, dyspnea with speech. No accessory muscle use.  Cardiovascular: S1 & S2 heard, regular  rate and rhythm. 1+ pretibial edema bilaterally, worse on right. 2+ pedal pulses. No carotid bruits. No significant JVD. Abdomen: No distension, no tenderness, no masses palpated. Bowel sounds normal.  Musculoskeletal: no clubbing / cyanosis. No joint deformity upper and lower extremities.  Skin: no significant rashes, lesions, ulcers. Warm, dry, well-perfused. Neurologic: CN 2-12 grossly intact. Sensation intact. Strength 5/5 in all 4 limbs. B/L UE resting tremor. Psychiatric:  Alert and oriented x 3. Calm, cooperative.     Labs on Admission: I have personally reviewed following labs and imaging studies  CBC:  Recent Labs Lab 01/26/17 1752  WBC 11.3*  HGB 15.6  HCT 45.4  MCV 95.6  PLT 199   Basic Metabolic Panel:  Recent Labs Lab 01/26/17 1752  NA 138  K 3.3*  CL 102  CO2 25  GLUCOSE 139*  BUN 36*  CREATININE 2.40*  CALCIUM 9.7   GFR: Estimated Creatinine Clearance: 29.6 mL/min (  A) (by C-G formula based on SCr of 2.4 mg/dL (H)). Liver Function Tests:  Recent Labs Lab 01/26/17 1752  AST 21  ALT 12*  ALKPHOS 109  BILITOT 1.2  PROT 7.7  ALBUMIN 3.7   No results for input(s): LIPASE, AMYLASE in the last 168 hours. No results for input(s): AMMONIA in the last 168 hours. Coagulation Profile: No results for input(s): INR, PROTIME in the last 168 hours. Cardiac Enzymes:  Recent Labs Lab 01/26/17 1752  TROPONINI <0.03   BNP (last 3 results) No results for input(s): PROBNP in the last 8760 hours. HbA1C: No results for input(s): HGBA1C in the last 72 hours. CBG:  Recent Labs Lab 01/26/17 1809 01/26/17 1938 01/26/17 2224  GLUCAP 136* 84 334*   Lipid Profile: No results for input(s): CHOL, HDL, LDLCALC, TRIG, CHOLHDL, LDLDIRECT in the last 72 hours. Thyroid Function Tests: No results for input(s): TSH, T4TOTAL, FREET4, T3FREE, THYROIDAB in the last 72 hours. Anemia Panel: No results for input(s): VITAMINB12, FOLATE, FERRITIN, TIBC, IRON, RETICCTPCT  in the last 72 hours. Urine analysis: No results found for: COLORURINE, APPEARANCEUR, LABSPEC, PHURINE, GLUCOSEU, HGBUR, BILIRUBINUR, KETONESUR, PROTEINUR, UROBILINOGEN, NITRITE, LEUKOCYTESUR Sepsis Labs: @LABRCNTIP (procalcitonin:4,lacticidven:4) )No results found for this or any previous visit (from the past 240 hour(s)).   Radiological Exams on Admission: [place] Venous Img Lower Bilateral  Result Date: 01/26/2017 CLINICAL DATA:  Right knee swelling for 2 weeks with shortness of breath, history of prior DVT EXAM: BILATERAL LOWER EXTREMITY VENOUS DOPPLER ULTRASOUND TECHNIQUE: Gray-scale sonography with graded compression, as well as color Doppler and duplex ultrasound were performed to evaluate the lower extremity deep venous systems from the level of the common femoral vein and including the common femoral, femoral, profunda femoral, popliteal and calf veins including the posterior tibial, peroneal and gastrocnemius veins when visible. The superficial great saphenous vein was also interrogated. Spectral Doppler was utilized to evaluate flow at rest and with distal augmentation maneuvers in the common femoral, femoral and popliteal veins. COMPARISON:  None. FINDINGS: RIGHT LOWER EXTREMITY Common Femoral Vein: Nearly occlusive thrombus. Partially compressible. Dampened waveform. Saphenofemoral Junction: Near occlusive thrombus. Partially compressible. Profunda Femoral Vein: Near occlusive thrombus. Minimal flow present. Femoral Vein: Occlusive thrombus. Noncompressible. No significant flow. Popliteal Vein: Occlusive thrombus. Noncompressible. No significant flow. Calf Veins: Poorly visualized calf veins. Superficial Great Saphenous Vein: No evidence of thrombus at the ankle or upper thigh. Normal compressibility. Other Findings:  Probable thrombus in the gastrocnemius vein. LEFT LOWER EXTREMITY Common Femoral Vein: No evidence of thrombus. Normal compressibility, respiratory phasicity and response to  augmentation. Saphenofemoral Junction: No evidence of thrombus. Normal compressibility and flow on color Doppler imaging. Profunda Femoral Vein: No evidence of thrombus. Normal compressibility and flow on color Doppler imaging. Femoral Vein: No evidence of thrombus. Normal compressibility, respiratory phasicity and response to augmentation. Popliteal Vein: No evidence of thrombus. Normal compressibility, respiratory phasicity and response to augmentation. Calf Veins: Limited visualization. Superficial Great Saphenous Vein: No evidence of thrombus. Normal compressibility. Other Findings:  None. IMPRESSION: 1. Positive for acute deep venous thrombosis with extensive thrombus present in the right common femoral vein, saphenous femoral junction, profunda vein, femoral vein and popliteal vein. 2. Negative for acute DVT in the left lower extremity. Critical Value/emergent results were called by telephone at the time of interpretation on 01/26/2017 at 9:15 pm to Dr. Loren RacerAVID YELVERTON , who verbally acknowledged these results. Electronically Signed   By: Jasmine PangKim  Fujinaga M.D.   On: 01/26/2017 21:16   Dg Chest Portable 1 View  Result Date: 01/26/2017 CLINICAL DATA:  Former smoker with history of asthma. Oxygen dependent. Shortness of breath today. EXAM: PORTABLE CHEST 1 VIEW COMPARISON:  08/23/2016 FINDINGS: Lungs are adequately inflated without focal consolidation or effusion. Mild stable cardiomegaly. Mild calcified plaque over the thoracic aorta. Degenerative change of the spine. IMPRESSION: No acute cardiopulmonary disease. Mild stable cardiomegaly. Electronically Signed   By: Elberta Fortis M.D.   On: 01/26/2017 18:16    EKG: Independently reviewed. Sinus rhythm, 1st degree AV block, chronic RBBB.   Assessment/Plan  1. Dyspnea  - Pt presents with 1 wk of dyspnea and cough after recently coming off of Pradaxa  - He is hemodynamically stable, no hypoxia, initial troponin undetectable  - Noted to extensive acute  DVT in RLE and high pretest probability for PE  - Started on heparin infusion in ED and continued  - Continue cardiac monitoring, obtain serial troponin measurements, V/Q scan, echocardiogram   2. Acute RLE DVT  - Pt noted to have extensive acute thrombus through veins of RLE  - Started on heparin infusion  - PE eval pending as above   3. Hypertension  - BP is at goal  - Continue lisinopril as tolerated   4. Insulin-dependent DM  - No A1c on file  - Managed at home with Lantus 70 units qHS and Novolog 20 units TID  - Check CBG with meals and qHS, continue Lantus 70 units qHS, Novolog 10 TID, and Novolog correctional   5. Chronic diastolic CHF - Appears well compensated, a little volume-up  - SLIV, follow daily wts and I/O's, continue lisinopril and Lasix   6. Hypokalemia  - Serum potassium is 3.2 on admission  - Supplemental KCl added to IVF - Repeat chem panel in am   7. CKD stage III-IV - SCr is 2.40 on admission, similar to priors  - Avoid nephrotoxins where possible, renally-dose medications - He is being hydrated with IVF and chem panel will be repeated in am    DVT prophylaxis: IV heparin infusion  Code Status: Full  Family Communication: Discussed with patient Disposition Plan: Observe on telemetry Consults called: None Admission status: Observation    Briscoe Deutscher, MD [facility] Pager [phone]  If 7PM-7AM, please contact night-coverage [email or web address] Password [email or web address]  01/26/2017, 10:39 PM

## 2017-01-26 NOTE — ED Provider Notes (Signed)
[facility] EMERGENCY DEPARTMENT Provider Note   CSN: [id number] Arrival date & time: 01/26/17  1739     History   Chief Complaint Chief Complaint  Patient presents with  . Shortness of Breath    HPI Joshua BowensRaymond Walter Dalton is a 76 y.o. male.  HPI Patient presents with 1 week of increased shortness of breath, sputum production, fever and chills.  Denies any chest pain.  States he was recently taken off Pradaxa.  No new lower extremity swelling or pain.  Denies orthopnea. Past Medical History:  Diagnosis Date  . Asthma   . Chronic diastolic (congestive) heart failure (HCC)    a. echo in 08/2016 showed a preserved EF of 60-65%, Grade 1 DD, and no WMA. Mild AI noted.   . Chronic kidney disease, stage III (moderate) (HCC)   . Diabetes mellitus without complication (HCC)   . Heart disease   . Hypercholesteremia   . Hypertension   . Obstructive sleep apnea   . Personal history of venous thrombosis and embolism     Patient Active Problem List   Diagnosis Date Noted  . SOB (shortness of breath) 01/26/2017  . Chronic respiratory failure with hypoxia (HCC) 09/21/2016  . HLD (hyperlipidemia) 09/19/2016  . Obstructive sleep apnea 09/03/2016  . Third degree heart block (HCC) 09/03/2016  . Dysphagia 09/03/2016  . Personal history of DVT (deep vein thrombosis) 09/03/2016  . Dyspnea 08/20/2016  . DM2 (diabetes mellitus, type 2) (HCC) 08/20/2016  . HTN (hypertension) 08/20/2016  . Chronic kidney disease (CKD), stage III (moderate) (HCC) 08/20/2016    Past Surgical History:  Procedure Laterality Date  . CARDIAC SURGERY    . ESOPHAGOGASTRODUODENOSCOPY (EGD) WITH PROPOFOL N/A 08/26/2016   Procedure: ESOPHAGOGASTRODUODENOSCOPY (EGD) WITH PROPOFOL;  Surgeon: Kathi DerBrahmbhatt, Parag, MD;  Location: [facility];  Service: Gastroenterology;  Laterality: N/A;       Home Medications    Prior to Admission medications   Medication Sig Start Date End Date Taking? Authorizing  Provider  allopurinol (ZYLOPRIM) 300 MG tablet Take 300 mg by mouth daily.    [provider]  fenofibrate (TRICOR) 145 MG tablet Take 145 mg by mouth daily.    [provider]  fexofenadine (ALLEGRA) 180 MG tablet Take 180 mg by mouth daily.    [provider]  finasteride (PROSCAR) 5 MG tablet Take 5 mg by mouth daily.    [provider]  FLUoxetine (PROZAC) 20 MG capsule Take 20 mg by mouth daily.    [provider]  furosemide (LASIX) 40 MG tablet TAKE 1 TABLET BY MOUTH IN THE MORNING AND 1/2 IN THE EVENING 10/03/16   Strader, GrenadaBrittany M, PA-C  insulin aspart (NOVOLOG) 100 UNIT/ML injection Inject 20 Units into the skin 3 (three) times daily before meals.     [provider]  insulin glargine (LANTUS) 100 UNIT/ML injection Inject 70 Units into the skin at bedtime.    [provider]  lisinopril (PRINIVIL,ZESTRIL) 5 MG tablet Take 1 tablet (5 mg total) by mouth daily. 10/03/16   Strader, Lennart PallBrittany M, PA-C  nitroGLYCERIN (NITROSTAT) 0.4 MG SL tablet Place 0.4 mg under the tongue every 5 (five) minutes as needed for chest pain.    [provider]  oxybutynin (DITROPAN-XL) 10 MG 24 hr tablet Take 10 mg by mouth at bedtime.    [provider]  pantoprazole (PROTONIX) 40 MG tablet Take 1 tablet (40 mg total) by mouth daily. 08/26/16 08/26/17  Clydia LlanoElmahi, Mutaz, MD  polyethylene glycol (  MIRALAX / GLYCOLAX) packet Take 17 g by mouth 2 (two) times daily.    [provider]  pravastatin (PRAVACHOL) 80 MG tablet Take 80 mg by mouth daily.    [provider]  tamsulosin (FLOMAX) 0.4 MG CAPS capsule  08/20/16   [provider]    Family History No family history on file.  Social History Social History  Substance Use Topics  . Smoking status: Former Games developer  . Smokeless tobacco: Never Used  . Alcohol use No     Allergies   Patient has no known allergies.   Review of Systems Review of Systems    Constitutional: Positive for chills, fatigue and fever.  HENT: Negative for sore throat and trouble swallowing.   Eyes: Negative for visual disturbance.  Respiratory: Positive for cough and shortness of breath.   Cardiovascular: Negative for chest pain, palpitations and leg swelling.  Gastrointestinal: Negative for abdominal pain, diarrhea, nausea and vomiting.  Genitourinary: Negative for dysuria, flank pain and frequency.  Musculoskeletal: Negative for back pain, myalgias and neck pain.  Skin: Negative for rash and wound.  Neurological: Negative for dizziness, weakness, light-headedness, numbness and headaches.  All other systems reviewed and are negative.    Physical Exam Updated Vital Signs BP 130/71   Pulse 86   Temp 98.9 F (37.2 C) (Oral)   Resp 20   SpO2 100%   Physical Exam  Constitutional: He is oriented to person, place, and time. He appears well-developed and well-nourished.  HENT:  Head: Normocephalic and atraumatic.  Mouth/Throat: Oropharynx is clear and moist. No oropharyngeal exudate.  Eyes: Pupils are equal, round, and reactive to light. EOM are normal.  Neck: Normal range of motion. Neck supple. No JVD present.  Cardiovascular: Normal rate and regular rhythm.  Exam reveals no gallop and no friction rub.   No murmur heard. Pulmonary/Chest:  Increased work of breathing.  Speaking in full sentences.  Diminished breath sounds in bilateral bases.  Abdominal: Soft. Bowel sounds are normal. There is no tenderness. There is no rebound and no guarding.  Musculoskeletal: Normal range of motion. He exhibits no edema or tenderness.  No lower extremity swelling, asymmetry or tenderness.  Neurological: He is alert and oriented to person, place, and time.  Moves all extremities without focal deficit.  Sensation fully intact.  Skin: Skin is warm and dry. Capillary refill takes less than 2 seconds. No rash noted. No erythema.  Psychiatric: He has a normal mood and affect.  His behavior is normal.  Nursing note and vitals reviewed.    ED Treatments / Results  Labs (all labs ordered are listed, but only abnormal results are displayed) Labs Reviewed  CBC - Abnormal; Notable for the following:       Result Value   WBC 11.3 (*)    All other components within normal limits  COMPREHENSIVE METABOLIC PANEL - Abnormal; Notable for the following:    Potassium 3.3 (*)    Glucose, Bld 139 (*)    BUN 36 (*)    Creatinine, Ser 2.40 (*)    ALT 12 (*)    GFR calc non Af Amer 24 (*)    GFR calc Af Amer 28 (*)    All other components within normal limits  D-DIMER, QUANTITATIVE (NOT AT [facility]) - Abnormal; Notable for the following:    D-Dimer, Quant 13.76 (*)    All other components within normal limits  I-STAT CG4 LACTIC ACID, ED - Abnormal; Notable for the following:  Lactic Acid, Venous 2.78 (*)    All other components within normal limits  CBG MONITORING, ED - Abnormal; Notable for the following:    Glucose-Capillary 136 (*)    All other components within normal limits  CULTURE, BLOOD (ROUTINE X 2)  CULTURE, BLOOD (ROUTINE X 2)  TROPONIN I  BRAIN NATRIURETIC PEPTIDE  URINALYSIS, ROUTINE W REFLEX MICROSCOPIC  CBG MONITORING, ED    EKG  EKG Interpretation  Date/Time:  Friday January 26 2017 18:14:33 EDT Ventricular Rate:  69 PR Interval:  252 QRS Duration: 122 QT Interval:  406 QTC Calculation: 435 R Axis:   75 Text Interpretation:  Sinus rhythm with marked sinus arrhythmia with 1st degree A-V block Right bundle branch block Abnormal ECG Confirmed by Loren Racer ([id number]) on 01/26/2017 8:31:31 PM       Radiology Dg Chest Portable 1 View  Result Date: 01/26/2017 CLINICAL DATA:  Former smoker with history of asthma. Oxygen dependent. Shortness of breath today. EXAM: PORTABLE CHEST 1 VIEW COMPARISON:  08/23/2016 FINDINGS: Lungs are adequately inflated without focal consolidation or effusion. Mild stable cardiomegaly. Mild calcified plaque over  the thoracic aorta. Degenerative change of the spine. IMPRESSION: No acute cardiopulmonary disease. Mild stable cardiomegaly. Electronically Signed   By: Elberta Fortis M.D.   On: 01/26/2017 18:16    Procedures Procedures (including critical care time)  Medications Ordered in ED Medications  heparin bolus via infusion 4,000 Units (not administered)  heparin ADULT infusion 100 units/mL (25000 units/267mL sodium chloride 0.45%) (not administered)  methylPREDNISolone sodium succinate (SOLU-MEDROL) 125 mg/2 mL injection 125 mg (125 mg Intravenous Given 01/26/17 1847)  albuterol (PROVENTIL) (2.5 MG/3ML) 0.083% nebulizer solution 5 mg (5 mg Nebulization Given 01/26/17 1856)  levofloxacin (LEVAQUIN) IVPB 500 mg (500 mg Intravenous New Bag/Given 01/26/17 1927)     Initial Impression / Assessment and Plan / ED Course  I have reviewed the triage vital signs and the nursing notes.  Pertinent labs & imaging results that were available during my care of the patient were reviewed by me and considered in my medical decision making (see chart for details).     X-ray without acute findings.  Mild elevation in white blood cell count.  Significant elevation in d-dimer.  Patient with baseline renal dysfunction.  Will need VQ scan to rule out PE.  Patient received Solu-Medrol and breathing treatment in the emergency department.  States his breathing has improved.  Given elevation in white blood cell count and reported low-grade fever will start on antibiotics.  Discussed with Dr. Onalee Hua.  Will accept in transfer to [facility].  Will start heparin for possible PE.  Final Clinical Impressions(s) / ED Diagnoses   Final diagnoses:  Dyspnea, unspecified type    New Prescriptions New Prescriptions   No medications on file     Loren Racer, MD 01/26/17 2032

## 2017-01-26 NOTE — ED Notes (Signed)
Attempted to call report to floor, RN unavailable at this time. This RN was told, RN would call back shortly.

## 2017-01-27 ENCOUNTER — Observation Stay (HOSPITAL_BASED_OUTPATIENT_CLINIC_OR_DEPARTMENT_OTHER): Payer: Medicare Other

## 2017-01-27 ENCOUNTER — Observation Stay (HOSPITAL_COMMUNITY): Payer: Medicare Other

## 2017-01-27 ENCOUNTER — Encounter (HOSPITAL_COMMUNITY): Payer: Self-pay | Admitting: *Deleted

## 2017-01-27 DIAGNOSIS — I82409 Acute embolism and thrombosis of unspecified deep veins of unspecified lower extremity: Secondary | ICD-10-CM | POA: Diagnosis not present

## 2017-01-27 DIAGNOSIS — Z86718 Personal history of other venous thrombosis and embolism: Secondary | ICD-10-CM | POA: Diagnosis not present

## 2017-01-27 DIAGNOSIS — I130 Hypertensive heart and chronic kidney disease with heart failure and stage 1 through stage 4 chronic kidney disease, or unspecified chronic kidney disease: Secondary | ICD-10-CM | POA: Diagnosis present

## 2017-01-27 DIAGNOSIS — E785 Hyperlipidemia, unspecified: Secondary | ICD-10-CM | POA: Diagnosis present

## 2017-01-27 DIAGNOSIS — E1122 Type 2 diabetes mellitus with diabetic chronic kidney disease: Secondary | ICD-10-CM | POA: Diagnosis present

## 2017-01-27 DIAGNOSIS — I4510 Unspecified right bundle-branch block: Secondary | ICD-10-CM | POA: Diagnosis present

## 2017-01-27 DIAGNOSIS — N183 Chronic kidney disease, stage 3 (moderate): Secondary | ICD-10-CM | POA: Diagnosis not present

## 2017-01-27 DIAGNOSIS — I441 Atrioventricular block, second degree: Secondary | ICD-10-CM | POA: Diagnosis present

## 2017-01-27 DIAGNOSIS — I4430 Unspecified atrioventricular block: Secondary | ICD-10-CM

## 2017-01-27 DIAGNOSIS — G4733 Obstructive sleep apnea (adult) (pediatric): Secondary | ICD-10-CM | POA: Diagnosis present

## 2017-01-27 DIAGNOSIS — I5032 Chronic diastolic (congestive) heart failure: Secondary | ICD-10-CM | POA: Diagnosis present

## 2017-01-27 DIAGNOSIS — E1165 Type 2 diabetes mellitus with hyperglycemia: Secondary | ICD-10-CM | POA: Diagnosis present

## 2017-01-27 DIAGNOSIS — R0600 Dyspnea, unspecified: Secondary | ICD-10-CM | POA: Diagnosis not present

## 2017-01-27 DIAGNOSIS — E7800 Pure hypercholesterolemia, unspecified: Secondary | ICD-10-CM | POA: Diagnosis present

## 2017-01-27 DIAGNOSIS — Z87891 Personal history of nicotine dependence: Secondary | ICD-10-CM | POA: Diagnosis not present

## 2017-01-27 DIAGNOSIS — Z8249 Family history of ischemic heart disease and other diseases of the circulatory system: Secondary | ICD-10-CM | POA: Diagnosis not present

## 2017-01-27 DIAGNOSIS — I82411 Acute embolism and thrombosis of right femoral vein: Secondary | ICD-10-CM | POA: Diagnosis present

## 2017-01-27 DIAGNOSIS — R0602 Shortness of breath: Secondary | ICD-10-CM | POA: Diagnosis present

## 2017-01-27 DIAGNOSIS — E11649 Type 2 diabetes mellitus with hypoglycemia without coma: Secondary | ICD-10-CM | POA: Diagnosis not present

## 2017-01-27 DIAGNOSIS — Z794 Long term (current) use of insulin: Secondary | ICD-10-CM | POA: Diagnosis not present

## 2017-01-27 DIAGNOSIS — Z23 Encounter for immunization: Secondary | ICD-10-CM | POA: Diagnosis present

## 2017-01-27 DIAGNOSIS — I2699 Other pulmonary embolism without acute cor pulmonale: Secondary | ICD-10-CM | POA: Diagnosis present

## 2017-01-27 DIAGNOSIS — I82431 Acute embolism and thrombosis of right popliteal vein: Secondary | ICD-10-CM | POA: Diagnosis present

## 2017-01-27 DIAGNOSIS — I82491 Acute embolism and thrombosis of other specified deep vein of right lower extremity: Secondary | ICD-10-CM | POA: Diagnosis present

## 2017-01-27 DIAGNOSIS — E876 Hypokalemia: Secondary | ICD-10-CM | POA: Diagnosis present

## 2017-01-27 DIAGNOSIS — I10 Essential (primary) hypertension: Secondary | ICD-10-CM | POA: Diagnosis not present

## 2017-01-27 DIAGNOSIS — Z96651 Presence of right artificial knee joint: Secondary | ICD-10-CM | POA: Diagnosis present

## 2017-01-27 DIAGNOSIS — I824Y1 Acute embolism and thrombosis of unspecified deep veins of right proximal lower extremity: Secondary | ICD-10-CM | POA: Diagnosis not present

## 2017-01-27 DIAGNOSIS — N184 Chronic kidney disease, stage 4 (severe): Secondary | ICD-10-CM | POA: Diagnosis present

## 2017-01-27 LAB — BASIC METABOLIC PANEL
ANION GAP: 16 — AB (ref 5–15)
BUN: 36 mg/dL — ABNORMAL HIGH (ref 6–20)
CHLORIDE: 100 mmol/L — AB (ref 101–111)
CO2: 18 mmol/L — AB (ref 22–32)
Calcium: 9.3 mg/dL (ref 8.9–10.3)
Creatinine, Ser: 2.59 mg/dL — ABNORMAL HIGH (ref 0.61–1.24)
GFR calc non Af Amer: 22 mL/min — ABNORMAL LOW (ref >60)
GFR, EST AFRICAN AMERICAN: 26 mL/min — AB (ref >60)
GLUCOSE: 428 mg/dL — AB (ref 65–99)
Potassium: 4 mmol/L (ref 3.5–5.1)
Sodium: 134 mmol/L — ABNORMAL LOW (ref 135–145)

## 2017-01-27 LAB — CBC WITH DIFFERENTIAL/PLATELET
BASOS ABS: 0 10*3/uL (ref 0.0–0.1)
Basophils Relative: 0 %
Eosinophils Absolute: 0 10*3/uL (ref 0.0–0.7)
Eosinophils Relative: 0 %
HEMATOCRIT: 41.3 % (ref 39.0–52.0)
HEMOGLOBIN: 14.4 g/dL (ref 13.0–17.0)
LYMPHS PCT: 6 %
Lymphs Abs: 0.7 10*3/uL (ref 0.7–4.0)
MCH: 33 pg (ref 26.0–34.0)
MCHC: 34.9 g/dL (ref 30.0–36.0)
MCV: 94.5 fL (ref 78.0–100.0)
MONOS PCT: 2 %
Monocytes Absolute: 0.2 10*3/uL (ref 0.1–1.0)
NEUTROS ABS: 10.9 10*3/uL — AB (ref 1.7–7.7)
NEUTROS PCT: 92 %
Platelets: 205 10*3/uL (ref 150–400)
RBC: 4.37 MIL/uL (ref 4.22–5.81)
RDW: 13.5 % (ref 11.5–15.5)
WBC: 11.7 10*3/uL — ABNORMAL HIGH (ref 4.0–10.5)

## 2017-01-27 LAB — GLUCOSE, CAPILLARY
GLUCOSE-CAPILLARY: 346 mg/dL — AB (ref 65–99)
GLUCOSE-CAPILLARY: 116 mg/dL — AB (ref 65–99)
GLUCOSE-CAPILLARY: 66 mg/dL (ref 65–99)
GLUCOSE-CAPILLARY: 67 mg/dL (ref 65–99)
GLUCOSE-CAPILLARY: 78 mg/dL (ref 65–99)
Glucose-Capillary: 418 mg/dL — ABNORMAL HIGH (ref 65–99)
Glucose-Capillary: 98 mg/dL (ref 65–99)

## 2017-01-27 LAB — LACTIC ACID, PLASMA
LACTIC ACID, VENOUS: 2.9 mmol/L — AB (ref 0.5–1.9)
LACTIC ACID, VENOUS: 4.6 mmol/L — AB (ref 0.5–1.9)

## 2017-01-27 LAB — URINALYSIS, ROUTINE W REFLEX MICROSCOPIC
Bilirubin Urine: NEGATIVE
Glucose, UA: >=500 mg/dL — AB
Ketones, ur: NEGATIVE mg/dL
Leukocytes, UA: NEGATIVE
Nitrite: NEGATIVE
PH: 6 (ref 5.0–8.0)
PROTEIN: 30 mg/dL — AB
SPECIFIC GRAVITY, URINE: 1.011 (ref 1.005–1.030)

## 2017-01-27 LAB — ECHOCARDIOGRAM COMPLETE
HEIGHTINCHES: 66 in
WEIGHTICAEL: 3779.2 [oz_av]

## 2017-01-27 LAB — HEPARIN LEVEL (UNFRACTIONATED)
HEPARIN UNFRACTIONATED: 0.16 [IU]/mL — AB (ref 0.30–0.70)
Heparin Unfractionated: 0.51 IU/mL (ref 0.30–0.70)

## 2017-01-27 LAB — TROPONIN I
Troponin I: <0.03 ng/mL (ref <0.03)
Troponin I: <0.03 ng/mL (ref <0.03)

## 2017-01-27 MED ORDER — PERFLUTREN LIPID MICROSPHERE
1.0000 mL | INTRAVENOUS | Status: AC | PRN
  Administered 2017-01-27: 4 mL via INTRAVENOUS
  Filled 2017-01-27: qty 10

## 2017-01-27 MED ORDER — INSULIN ASPART 100 UNIT/ML ~~LOC~~ SOLN
20.0000 [IU] | Freq: Three times a day (TID) | SUBCUTANEOUS | Status: DC
  Administered 2017-01-27 – 2017-01-28 (×3): 20 [IU] via SUBCUTANEOUS

## 2017-01-27 MED ORDER — TECHNETIUM TO 99M ALBUMIN AGGREGATED
4.3300 | Freq: Once | INTRAVENOUS | Status: AC | PRN
  Administered 2017-01-27: 4.33 via INTRAVENOUS

## 2017-01-27 MED ORDER — INSULIN ASPART 100 UNIT/ML ~~LOC~~ SOLN
0.0000 [IU] | Freq: Every day | SUBCUTANEOUS | Status: DC
  Administered 2017-01-29: 2 [IU] via SUBCUTANEOUS

## 2017-01-27 MED ORDER — TECHNETIUM TC 99M DIETHYLENETRIAME-PENTAACETIC ACID
33.0000 | Freq: Once | INTRAVENOUS | Status: AC | PRN
  Administered 2017-01-27: 33 via RESPIRATORY_TRACT

## 2017-01-27 MED ORDER — INSULIN ASPART 100 UNIT/ML ~~LOC~~ SOLN
0.0000 [IU] | Freq: Three times a day (TID) | SUBCUTANEOUS | Status: DC
  Administered 2017-01-27: 15 [IU] via SUBCUTANEOUS
  Administered 2017-01-28 (×3): 4 [IU] via SUBCUTANEOUS
  Administered 2017-01-29 (×2): 7 [IU] via SUBCUTANEOUS
  Administered 2017-01-30: 4 [IU] via SUBCUTANEOUS

## 2017-01-27 MED ORDER — HEPARIN BOLUS VIA INFUSION
2000.0000 [IU] | Freq: Once | INTRAVENOUS | Status: AC
  Administered 2017-01-27: 2000 [IU] via INTRAVENOUS
  Filled 2017-01-27: qty 2000

## 2017-01-27 NOTE — Progress Notes (Signed)
Pt's lactic acid is critically high. MD on called has been paged but have not responded. Another level will be drawn at 0300. Will continue to monitor.

## 2017-01-27 NOTE — Progress Notes (Signed)
  Echocardiogram 2D Echocardiogram has been performed.  Delcie RochENNINGTON, Trixy Loyola 01/27/2017, 9:50 AM

## 2017-01-27 NOTE — Progress Notes (Signed)
ANTICOAGULATION CONSULT NOTE - Follow Up Consult  Pharmacy Consult for Heparin  Indication: pulmonary embolus and DVT  No Known Allergies  Patient Measurements: Height: 5\' 6"  (167.6 cm) Weight: 236 lb 3.2 oz (107.1 kg) IBW/kg (Calculated) : 63.8 Vital Signs: Temp: 97.6 F (36.4 C) (10/27 1923) Temp Source: Oral (10/27 1923) BP: 108/64 (10/27 2300) Pulse Rate: 81 (10/27 2300)  Labs:  Recent Labs  01/26/17 1752 01/26/17 2241 01/27/17 0530 01/27/17 1246 01/27/17 2044  HGB 15.6  --  14.4  --   --   HCT 45.4  --  41.3  --   --   PLT 199  --  205  --   --   HEPARINUNFRC  --   --   --  0.51 0.16*  CREATININE 2.40*  --  2.59*  --   --   TROPONINI <0.03 <0.03 <0.03 <0.03  --     Estimated Creatinine Clearance: 27.4 mL/min (A) (by C-G formula based on SCr of 2.59 mg/dL (H)).   Assessment: 76 [facility] and probable PE per VQ scan. Heparin level is low tonight. No issues per RN.   Goal of Therapy:  Heparin level 0.3-0.7 units/ml Monitor platelets by anticoagulation protocol: Yes   Plan:  Heparin 2000 units [employer] heparin to 1600 units/hr AM Heparin level  Abran DukeLedford, Sherline Eberwein 01/27/2017,11:32 PM

## 2017-01-27 NOTE — Progress Notes (Signed)
ANTICOAGULATION CONSULT NOTE   Pharmacy Consult for Heparin Indication: VTE prophylaxis  No Known Allergies  Patient Measurements: Height: 5\' 6"  (167.6 cm) Weight: 236 lb 3.2 oz (107.1 kg) IBW/kg (Calculated) : 63.8  Vital Signs: Temp: 97.6 F (36.4 C) (10/27 1142) Temp Source: Oral (10/27 1142) BP: 115/68 (10/27 1142) Pulse Rate: 88 (10/27 1142)  Labs:  Recent Labs  01/26/17 1752 01/26/17 2241 01/27/17 0530 01/27/17 1246  HGB 15.6  --  14.4  --   HCT 45.4  --  41.3  --   PLT 199  --  205  --   HEPARINUNFRC  --   --   --  0.51  CREATININE 2.40*  --  2.59*  --   TROPONINI <0.03 <0.03 <0.03  --     Estimated Creatinine Clearance: 27.4 mL/min (A) (by C-G formula based on SCr of 2.59 mg/dL (H)).   Medical History: Past Medical History:  Diagnosis Date  . Asthma   . Chronic diastolic (congestive) heart failure (HCC)    a. echo in 08/2016 showed a preserved EF of 60-65%, Grade 1 DD, and no WMA. Mild AI noted.   . Chronic kidney disease, stage III (moderate) (HCC)   . Diabetes mellitus without complication (HCC)   . Heart disease   . Hypercholesteremia   . Hypertension   . Obstructive sleep apnea   . Personal history of venous thrombosis and embolism     Medications:  Prescriptions Prior to Admission  Medication Sig Dispense Refill Last Dose  . allopurinol (ZYLOPRIM) 300 MG tablet Take 300 mg by mouth daily.   Taking  . fenofibrate (TRICOR) 145 MG tablet Take 145 mg by mouth daily.   Taking  . fexofenadine (ALLEGRA) 180 MG tablet Take 180 mg by mouth daily.   Taking  . finasteride (PROSCAR) 5 MG tablet Take 5 mg by mouth daily.   Taking  . FLUoxetine (PROZAC) 20 MG capsule Take 20 mg by mouth daily.   Taking  . furosemide (LASIX) 40 MG tablet TAKE 1 TABLET BY MOUTH IN THE MORNING AND 1/2 IN THE EVENING 45 tablet 5 Taking  . insulin aspart (NOVOLOG) 100 UNIT/ML injection Inject 20 Units into the skin 3 (three) times daily before meals.    Taking  . insulin  glargine (LANTUS) 100 UNIT/ML injection Inject 70 Units into the skin at bedtime.   Taking  . lisinopril (PRINIVIL,ZESTRIL) 5 MG tablet Take 1 tablet (5 mg total) by mouth daily. 30 tablet 5 Taking  . nitroGLYCERIN (NITROSTAT) 0.4 MG SL tablet Place 0.4 mg under the tongue every 5 (five) minutes as needed for chest pain.   Taking  . oxybutynin (DITROPAN-XL) 10 MG 24 hr tablet Take 10 mg by mouth at bedtime.   Taking  . pantoprazole (PROTONIX) 40 MG tablet Take 1 tablet (40 mg total) by mouth daily. 30 tablet 1 Taking  . polyethylene glycol (MIRALAX / GLYCOLAX) packet Take 17 g by mouth 2 (two) times daily.   Taking  . pravastatin (PRAVACHOL) 80 MG tablet Take 80 mg by mouth daily.   Taking  . tamsulosin (FLOMAX) 0.4 MG CAPS capsule    Taking    Assessment: 68 YOM here with extreme shortness of breath with exertion. He was recently taken off Pradaxa. Lower extremity dopplers positive for acute DVT in the RLE. VQ scan results pending. Pharmacy currently running heparin at 1400 units/hr. HL this afternoon is therapeutic at 0.51. H/H stable, Plt wnl.   Goal of Therapy:  Heparin  level 0.3-0.7 units/ml Monitor platelets by anticoagulation protocol: Yes   Plan:  -Continue IV heparin at 1400 units/hr -F/u confirmatory 8 hr HL -Monitor daily HL, CBC and s/s of bleeding   Vinnie LevelBenjamin Carmilla Granville, PharmD., BCPS Clinical Pharmacist Pager [phone]

## 2017-01-27 NOTE — Progress Notes (Signed)
Patient ID: Joshua Dalton, male   DOB: 11-Nov-1940, 76 y.o.   MRN: [id number]  PROGRESS NOTE    Crit Obremski  WUJ:[id number] DOB: [birth year] DOA: 01/26/2017 PCP: Annita Brod, MD   Brief Narrative:  76 year old male with history of hypertension, hyperlipidemia, obstructive sleep apnea, chronic diastolic CHF, chronic kidney disease stage III, history of DVT recently taken off Pradaxa presented with shortness of breath and cough. Patient was admitted with acute extensive right lower extremity DVT along with suspected PE and started on heparin drip.  Assessment & Plan:   Principal Problem:   Acute DVT (deep venous thrombosis) (HCC) Active Problems:   DM2 (diabetes mellitus, type 2) (HCC)   HTN (hypertension)   Chronic kidney disease (CKD), stage III (moderate) (HCC)   SOB (shortness of breath)   Chronic diastolic CHF (congestive heart failure) (HCC)  1. Dyspnea  - Rule out PE. VQ scan pending. Follow-up echo. Continue heparin drip    2. Acute RLE DVT  - Continue heparin drip. Follow-up echo. Patient was on Pradaxa till almost 10 days ago. Consider starting NOAC tomorrow, probably Eliquis, if renal function allows   3. Hypertension  - Monitor blood pressure. Continue lisinopril  4. Insulin-dependent DM uncontrolled with hyperglycemia - Follow hemoglobin A1c. Continue long-acting and short-acting insulin with meals along with sliding scale coverage  5. Chronic diastolic CHF -Currently compensated. Strict input output, daily weights. Continue Lasix and lisinopril.   6. Hypokalemia  - Improved  7. CKD stage III-IV - Monitor creatinine. Currently stable    DVT prophylaxis: Heparin drip Code Status:  Full Family Communication: None at bedside Disposition Plan: Depends on clinical outcome  Consultants: None  Procedures: None  Antimicrobials: None    Subjective: Patient seen and examined at bedside. He denies current chest pain, nausea or  vomiting. Is mildly short of breath  Objective: Vitals:   01/26/17 2353 01/27/17 0507 01/27/17 0743 01/27/17 1142  BP:  (!) 112/56 111/76 115/68  Pulse: 94 93 82 88  Resp: 18 20 18 18   Temp:  97.7 F (36.5 C) 97.8 F (36.6 C) 97.6 F (36.4 C)  TempSrc:  Oral Oral Oral  SpO2: 95% 94% 96% 97%  Weight:  107.1 kg (236 lb 3.2 oz)    Height:        Intake/Output Summary (Last 24 hours) at 01/27/17 1218 Last data filed at 01/27/17 1143  Gross per 24 hour  Intake           336.97 ml  Output             1220 ml  Net          -883.03 ml   Filed Weights   01/26/17 2227 01/27/17 0507  Weight: 107.3 kg (236 lb 9.6 oz) 107.1 kg (236 lb 3.2 oz)    Examination:  General exam: Appears calm and comfortable  Respiratory system: Bilateral decreased breath sound at bases with some basilar crackles Cardiovascular system: S1 & S2 heard, rate controlled  Gastrointestinal system: Abdomen is nondistended, soft and nontender. Normal bowel sounds heard. Extremities: No cyanosis, clubbing; bilateral lower extremity edema worse on the right   Data Reviewed: I have personally reviewed following labs and imaging studies  CBC:  Recent Labs Lab 01/26/17 1752 01/27/17 0530  WBC 11.3* 11.7*  NEUTROABS  --  10.9*  HGB 15.6 14.4  HCT 45.4 41.3  MCV 95.6 94.5  PLT 199 205   Basic Metabolic Panel:  Recent Labs Lab 01/26/17 1752 01/27/17  0530  NA 138 134*  K 3.3* 4.0  CL 102 100*  CO2 25 18*  GLUCOSE 139* 428*  BUN 36* 36*  CREATININE 2.40* 2.59*  CALCIUM 9.7 9.3   GFR: Estimated Creatinine Clearance: 27.4 mL/min (A) (by C-G formula based on SCr of 2.59 mg/dL (H)). Liver Function Tests:  Recent Labs Lab 01/26/17 1752  AST 21  ALT 12*  ALKPHOS 109  BILITOT 1.2  PROT 7.7  ALBUMIN 3.7   No results for input(s): LIPASE, AMYLASE in the last 168 hours. No results for input(s): AMMONIA in the last 168 hours. Coagulation Profile: No results for input(s): INR, PROTIME in the last  168 hours. Cardiac Enzymes:  Recent Labs Lab 01/26/17 1752 01/26/17 2241 01/27/17 0530  TROPONINI <0.03 <0.03 <0.03   BNP (last 3 results) No results for input(s): PROBNP in the last 8760 hours. HbA1C: No results for input(s): HGBA1C in the last 72 hours. CBG:  Recent Labs Lab 01/26/17 1809 01/26/17 1938 01/26/17 2224 01/27/17 0742 01/27/17 1139  GLUCAP 136* 84 334* 418* 346*   Lipid Profile: No results for input(s): CHOL, HDL, LDLCALC, TRIG, CHOLHDL, LDLDIRECT in the last 72 hours. Thyroid Function Tests: No results for input(s): TSH, T4TOTAL, FREET4, T3FREE, THYROIDAB in the last 72 hours. Anemia Panel: No results for input(s): VITAMINB12, FOLATE, FERRITIN, TIBC, IRON, RETICCTPCT in the last 72 hours. Sepsis Labs:  Recent Labs Lab 01/26/17 1813 01/26/17 2050 01/27/17 0008 01/27/17 0522  LATICACIDVEN 2.78* 4.17* 4.6* 2.9*    Recent Results (from the past 240 hour(s))  Culture, blood (Routine X 2) w Reflex to ID Panel     Status: None (Preliminary result)   Collection Time: 01/26/17  5:55 PM  Result Value Ref Range Status   Specimen Description BLOOD RIGHT ANTECUBITAL  Final   Special Requests   Final    BOTTLES DRAWN AEROBIC AND ANAEROBIC Blood Culture adequate volume   Culture   Final    NO GROWTH < 12 HOURS Performed at [facility], [address] [place], [place] [address]    Report Status PENDING  Incomplete  Culture, blood (Routine X 2) w Reflex to ID Panel     Status: None (Preliminary result)   Collection Time: 01/26/17  6:10 PM  Result Value Ref Range Status   Specimen Description BLOOD BLOOD RIGHT HAND  Final   Special Requests   Final    IN PEDIATRIC BOTTLE Blood Culture results may not be optimal due to an inadequate volume of blood received in culture bottles   Culture   Final    NO GROWTH < 12 HOURS Performed at [facility], [address] [place], [place] [address]    Report Status PENDING  Incomplete          Radiology Studies: [place] Venous Img Lower Bilateral  Result Date: 01/26/2017 CLINICAL DATA:  Right knee swelling for 2 weeks with shortness of breath, history of prior DVT EXAM: BILATERAL LOWER EXTREMITY VENOUS DOPPLER ULTRASOUND TECHNIQUE: Gray-scale sonography with graded compression, as well as color Doppler and duplex ultrasound were performed to evaluate the lower extremity deep venous systems from the level of the common femoral vein and including the common femoral, femoral, profunda femoral, popliteal and calf veins including the posterior tibial, peroneal and gastrocnemius veins when visible. The superficial great saphenous vein was also interrogated. Spectral Doppler was utilized to evaluate flow at rest and with distal augmentation maneuvers in the common femoral, femoral and popliteal veins. COMPARISON:  None. FINDINGS: RIGHT LOWER EXTREMITY Common Femoral Vein: Nearly occlusive thrombus. Partially compressible. Dampened waveform. Saphenofemoral Junction: Near occlusive thrombus. Partially compressible. Profunda Femoral Vein: Near occlusive thrombus. Minimal flow present. Femoral Vein: Occlusive thrombus. Noncompressible. No significant flow. Popliteal Vein: Occlusive thrombus. Noncompressible. No significant flow. Calf Veins: Poorly visualized calf veins. Superficial Great Saphenous Vein: No evidence of thrombus at the ankle or upper thigh. Normal compressibility. Other Findings:  Probable thrombus in the gastrocnemius vein. LEFT LOWER EXTREMITY Common Femoral Vein: No evidence of thrombus. Normal compressibility, respiratory phasicity and response to augmentation. Saphenofemoral Junction: No evidence of thrombus. Normal compressibility and flow on color Doppler imaging. Profunda Femoral Vein: No evidence of thrombus. Normal compressibility and flow on color Doppler imaging. Femoral Vein: No evidence of thrombus. Normal compressibility, respiratory phasicity and response to augmentation.  Popliteal Vein: No evidence of thrombus. Normal compressibility, respiratory phasicity and response to augmentation. Calf Veins: Limited visualization. Superficial Great Saphenous Vein: No evidence of thrombus. Normal compressibility. Other Findings:  None. IMPRESSION: 1. Positive for acute deep venous thrombosis with extensive thrombus present in the right common femoral vein, saphenous femoral junction, profunda vein, femoral vein and popliteal vein. 2. Negative for acute DVT in the left lower extremity. Critical Value/emergent results were called by telephone at the time of interpretation on 01/26/2017 at 9:15 pm to Dr. Loren Racer , who verbally acknowledged these results. Electronically Signed   By: Jasmine Pang M.D.   On: 01/26/2017 21:16   Dg Chest Portable 1 View  Result Date: 01/26/2017 CLINICAL DATA:  Former smoker with history of asthma. Oxygen dependent. Shortness of breath today. EXAM: PORTABLE CHEST 1 VIEW COMPARISON:  08/23/2016 FINDINGS: Lungs are adequately inflated without focal consolidation or effusion. Mild stable cardiomegaly. Mild calcified plaque over the thoracic aorta. Degenerative change of the spine. IMPRESSION: No acute cardiopulmonary disease. Mild stable cardiomegaly. Electronically Signed   By: Elberta Fortis M.D.   On: 01/26/2017 18:16        Scheduled Meds: . allopurinol  300 mg Oral Daily  . fenofibrate  160 mg Oral Daily  . finasteride  5 mg Oral Daily  . FLUoxetine  20 mg Oral Daily  . furosemide  40 mg Oral Daily  . Influenza vac split quadrivalent PF  0.5 mL Intramuscular Tomorrow-1000  . insulin aspart  0-20 Units Subcutaneous TID WC  . insulin aspart  0-5 Units Subcutaneous QHS  . insulin aspart  20 Units Subcutaneous TID WC  . insulin glargine  70 Units Subcutaneous QHS  . lisinopril  5 mg Oral Daily  . loratadine  10 mg Oral Daily  . oxybutynin  10 mg Oral QHS  . pantoprazole  40 mg Oral Daily  . polyethylene glycol  17 g Oral BID  .  pravastatin  80 mg Oral q1800  . sodium chloride flush  3 mL Intravenous Q12H  . tamsulosin  0.4 mg Oral Daily   Continuous Infusions: . heparin 1,400 Units/hr (01/26/17 2208)     LOS: 0 days        Glade Lloyd, MD [facility] Pager [phone]  If 7PM-7AM, please contact night-coverage [email or web address] Password [email or web address] 01/27/2017, 12:18 PM

## 2017-01-28 ENCOUNTER — Encounter (HOSPITAL_COMMUNITY): Payer: Self-pay | Admitting: Cardiology

## 2017-01-28 DIAGNOSIS — I2699 Other pulmonary embolism without acute cor pulmonale: Principal | ICD-10-CM

## 2017-01-28 DIAGNOSIS — I4430 Unspecified atrioventricular block: Secondary | ICD-10-CM

## 2017-01-28 LAB — GLUCOSE, CAPILLARY
Glucose-Capillary: 141 mg/dL — ABNORMAL HIGH (ref 65–99)
Glucose-Capillary: 199 mg/dL — ABNORMAL HIGH (ref 65–99)
Glucose-Capillary: 195 mg/dL — ABNORMAL HIGH (ref 65–99)
Glucose-Capillary: 153 mg/dL — ABNORMAL HIGH (ref 65–99)
Glucose-Capillary: 153 mg/dL — ABNORMAL HIGH (ref 65–99)

## 2017-01-28 LAB — CBC WITH DIFFERENTIAL/PLATELET
BASOS PCT: 0 %
Basophils Absolute: 0 10*3/uL (ref 0.0–0.1)
Eosinophils Absolute: 0.5 10*3/uL (ref 0.0–0.7)
Eosinophils Relative: 4 %
HEMATOCRIT: 43.6 % (ref 39.0–52.0)
HEMOGLOBIN: 14.7 g/dL (ref 13.0–17.0)
Lymphocytes Relative: 16 %
Lymphs Abs: 2.3 10*3/uL (ref 0.7–4.0)
MCH: 32.3 pg (ref 26.0–34.0)
MCHC: 33.7 g/dL (ref 30.0–36.0)
MCV: 95.8 fL (ref 78.0–100.0)
MONOS PCT: 7 %
Monocytes Absolute: 1 10*3/uL (ref 0.1–1.0)
NEUTROS ABS: 10.3 10*3/uL — AB (ref 1.7–7.7)
NEUTROS PCT: 73 %
Platelets: 240 10*3/uL (ref 150–400)
RBC: 4.55 MIL/uL (ref 4.22–5.81)
RDW: 14.2 % (ref 11.5–15.5)
WBC: 14.1 10*3/uL — ABNORMAL HIGH (ref 4.0–10.5)

## 2017-01-28 LAB — MAGNESIUM: Magnesium: 2.1 mg/dL (ref 1.7–2.4)

## 2017-01-28 LAB — BASIC METABOLIC PANEL
ANION GAP: 12 (ref 5–15)
BUN: 40 mg/dL — AB (ref 6–20)
CALCIUM: 9.4 mg/dL (ref 8.9–10.3)
CO2: 23 mmol/L (ref 22–32)
Chloride: 102 mmol/L (ref 101–111)
Creatinine, Ser: 2.59 mg/dL — ABNORMAL HIGH (ref 0.61–1.24)
GFR, EST AFRICAN AMERICAN: 26 mL/min — AB (ref >60)
GFR, EST NON AFRICAN AMERICAN: 22 mL/min — AB (ref >60)
GLUCOSE: 158 mg/dL — AB (ref 65–99)
POTASSIUM: 3.7 mmol/L (ref 3.5–5.1)
Sodium: 137 mmol/L (ref 135–145)

## 2017-01-28 LAB — HEPARIN LEVEL (UNFRACTIONATED)
HEPARIN UNFRACTIONATED: 0.67 [IU]/mL (ref 0.30–0.70)
Heparin Unfractionated: 0.97 IU/mL — ABNORMAL HIGH (ref 0.30–0.70)

## 2017-01-28 MED ORDER — INSULIN ASPART 100 UNIT/ML ~~LOC~~ SOLN
10.0000 [IU] | Freq: Three times a day (TID) | SUBCUTANEOUS | Status: DC
  Administered 2017-01-28 – 2017-01-30 (×5): 10 [IU] via SUBCUTANEOUS

## 2017-01-28 MED ORDER — INSULIN GLARGINE 100 UNIT/ML ~~LOC~~ SOLN
35.0000 [IU] | Freq: Every day | SUBCUTANEOUS | Status: DC
  Administered 2017-01-28 – 2017-01-29 (×2): 35 [IU] via SUBCUTANEOUS
  Filled 2017-01-28 (×2): qty 0.35

## 2017-01-28 NOTE — Progress Notes (Signed)
ANTICOAGULATION CONSULT NOTE   Pharmacy Consult for Heparin Indication: VTE prophylaxis  No Known Allergies  Patient Measurements: Height: 5\' 6"  (167.6 cm) Weight: 236 lb 3.2 oz (107.1 kg) IBW/kg (Calculated) : 63.8  Vital Signs: Temp: 97.6 F (36.4 C) (10/28 1203) Temp Source: Oral (10/28 1203) BP: 104/56 (10/28 1203) Pulse Rate: 64 (10/28 1203)  Labs:  Recent Labs  01/26/17 1752 01/26/17 2241 01/27/17 0530  01/27/17 1246 01/27/17 2044 01/28/17 0415 01/28/17 1424  HGB 15.6  --  14.4  --   --   --  14.7  --   HCT 45.4  --  41.3  --   --   --  43.6  --   PLT 199  --  205  --   --   --  240  --   HEPARINUNFRC  --   --   --   < > 0.51 0.16* 0.97* 0.67  CREATININE 2.40*  --  2.59*  --   --   --  2.59*  --   TROPONINI <0.03 <0.03 <0.03  --  <0.03  --   --   --   < > = values in this interval not displayed.  Estimated Creatinine Clearance: 27.4 mL/min (A) (by C-G formula based on SCr of 2.59 mg/dL (H)).   Medical History: Past Medical History:  Diagnosis Date  . Asthma   . Chronic diastolic (congestive) heart failure (HCC)    a. echo in 08/2016 showed a preserved EF of 60-65%, Grade 1 DD, and no WMA. Mild AI noted.   . Chronic kidney disease, stage III (moderate) (HCC)   . Diabetes mellitus without complication (HCC)   . Heart disease   . Hypercholesteremia   . Hypertension   . Obstructive sleep apnea   . Personal history of venous thrombosis and embolism     Medications:  Prescriptions Prior to Admission  Medication Sig Dispense Refill Last Dose  . allopurinol (ZYLOPRIM) 300 MG tablet Take 300 mg by mouth daily.   01/26/2017 at am  . fenofibrate (TRICOR) 145 MG tablet Take 145 mg by mouth daily.   01/26/2017 at am  . fexofenadine (ALLEGRA) 180 MG tablet Take 180 mg by mouth daily.   01/26/2017 at am  . finasteride (PROSCAR) 5 MG tablet Take 5 mg by mouth daily.   01/26/2017 at am  . FLUoxetine (PROZAC) 20 MG capsule Take 20 mg by mouth daily.   01/26/2017 at  am  . furosemide (LASIX) 40 MG tablet TAKE 1 TABLET BY MOUTH IN THE MORNING AND 1/2 IN THE EVENING (Patient taking differently: Take 20-40 mg by mouth See admin instructions. 40 mg in the morning and 20 mg in the evening) 45 tablet 5 01/26/2017 at am  . insulin aspart (NOVOLOG) 100 UNIT/ML injection Inject 20 Units into the skin 3 (three) times daily before meals.    01/26/2017 at pm  . insulin glargine (LANTUS) 100 UNIT/ML injection Inject 70 Units into the skin at bedtime.   01/25/2017 at pm  . lisinopril (PRINIVIL,ZESTRIL) 5 MG tablet Take 1 tablet (5 mg total) by mouth daily. 30 tablet 5 01/26/2017 at am  . oxybutynin (DITROPAN-XL) 10 MG 24 hr tablet Take 10 mg by mouth at bedtime.   01/25/2017 at pm  . pantoprazole (PROTONIX) 40 MG tablet Take 1 tablet (40 mg total) by mouth daily. 30 tablet 1 01/26/2017 at am  . pravastatin (PRAVACHOL) 80 MG tablet Take 80 mg by mouth daily.   01/26/2017 at am  .  tamsulosin (FLOMAX) 0.4 MG CAPS capsule Take 0.4 mg by mouth every morning.    01/26/2017 at am   Assessment: [age] YOM here with extreme shortness of breath with exertion. He was recently taken off Pradaxa. Lower extremity dopplers positive for acute DVT in the RLE. VQ scan results probable PE.  Heparin level therapeutic: 0.67 CBC stable with AM labs; no bleeding documented  Goal of Therapy:  Heparin level 0.3-0.7 units/ml Monitor platelets by anticoagulation protocol: Yes   Plan:  -Continue Heparin gtt at 1400 units/hr  -Monitor daily HL, CBC and s/s of bleeding  -F/U transition to oral anticoagulation  Ruben Imony Guled Gahan, PharmD Clinical Pharmacist 01/28/2017 3:17 PM

## 2017-01-28 NOTE — Consult Note (Signed)
Cardiology Consultation:   Patient ID: Joshua Dalton; [id number]; December 12, 1940   Admit date: 01/26/2017 Date of Consult: 01/28/2017  Primary Care Provider: Annita Brod, MD Primary Cardiologist: Dr. Johney Frame Primary Electrophysiologist:  Dr. Johney Frame    Patient Profile:   Joshua Dalton is a 76 y.o. male with a hx of nocturnal heart block, DM, HTN, DVT off pradaxa now for 10 days prior to admit and OSA who is being seen today for the evaluation of Bradycardia at the request of Dr. Hanley Ben.  History of Present Illness:   Joshua Dalton has a hx of nocturnal heart block, DM IDDM, HTN and OSA.  He now lives in [place] but when visiting 08/2016 was seen by Dr. Johney Frame for nocturnal heart block in pt with OSA and with CPAP.   Episodes occurred while sleeping with P-P and P-R prolongation. No syncope.  He does have some degree of AV block on EKG.  EF at that time 60-65%, G1 DD LA 35 mm.   Last seen in 12/2016.   Event monitor reviewed by Dr. Johney Frame " sinus rhythm with first degree AV block.  There is rare nocturnal mobitz I second degree AV block. I do not see mobitz II or more advanced AV block.  No prolonged pauses or symptomatic bradycardias"   Pt now admitted with SOB 01/26/17 .  Increasing dyspnea over last week.  Mostly with exertion.   Patient was on Pradaxa following a right lower extremity DVT that occurred postoperatively from a right knee replacement.  He had been on the anticoagulant for 2 years before he was taken off approximately 10 days ago.  In ER K+ 3.3, Cr 2.40, WBC 11.3, neg for DVT in Lt leg but + in Rt common femoral -acute.  IV heparin was started.  VQ with + PE.     CXR mild stable cardiomegaly and no acute cardiopulmonary disease.  EKGs personally reviewed  SR 1st degree block PR 290 ms with RBBB lots or artifact  Tele:  Personally reviewed 2:1 AV block during the night and another episode of longer pause but one beat maybe ventricular. Rare PVCs MD to  review.  Troponin X 3 <0.03 Lactic acid on admit 2.78 to pk of 4.6 Today Na 137, K+ 3.7, BUN 40 Cr 2.59 similar to prior Cr  WBC 14.1 Hgb 14.7  Currently he denies any chest pain, some SOB and some hip pain related to sciatica.  No syncope.  He had remote episode in May but even then he is not sure he passed out.  But no further episodes.     Past Medical History:  Diagnosis Date  . Asthma   . Chronic diastolic (congestive) heart failure (HCC)    a. echo in 08/2016 showed a preserved EF of 60-65%, Grade 1 DD, and no WMA. Mild AI noted.   . Chronic kidney disease, stage III (moderate) (HCC)   . Diabetes mellitus without complication (HCC)   . Heart disease   . Hypercholesteremia   . Hypertension   . Obstructive sleep apnea   . Personal history of venous thrombosis and embolism     Past Surgical History:  Procedure Laterality Date  . CARDIAC SURGERY    . ESOPHAGOGASTRODUODENOSCOPY (EGD) WITH PROPOFOL N/A 08/26/2016   Procedure: ESOPHAGOGASTRODUODENOSCOPY (EGD) WITH PROPOFOL;  Surgeon: Kathi Der, MD;  Location: [facility];  Service: Gastroenterology;  Laterality: N/A;     Home Medications:  Prior to Admission medications   Medication Sig Start Date End  Date Taking? Authorizing Provider  allopurinol (ZYLOPRIM) 300 MG tablet Take 300 mg by mouth daily.   Yes [provider]  fenofibrate (TRICOR) 145 MG tablet Take 145 mg by mouth daily.   Yes [provider]  fexofenadine (ALLEGRA) 180 MG tablet Take 180 mg by mouth daily.   Yes [provider]  finasteride (PROSCAR) 5 MG tablet Take 5 mg by mouth daily.   Yes [provider]  FLUoxetine (PROZAC) 20 MG capsule Take 20 mg by mouth daily.   Yes [provider]  furosemide (LASIX) 40 MG tablet TAKE 1 TABLET BY MOUTH IN THE MORNING AND 1/2 IN THE EVENING Patient taking differently: Take 20-40 mg by mouth See admin instructions. 40 mg in the morning and 20 mg in the evening 10/03/16   Yes Strader, [place] M, PA-C  insulin aspart (NOVOLOG) 100 UNIT/ML injection Inject 20 Units into the skin 3 (three) times daily before meals.    Yes [provider]  insulin glargine (LANTUS) 100 UNIT/ML injection Inject 70 Units into the skin at bedtime.   Yes [provider]  lisinopril (PRINIVIL,ZESTRIL) 5 MG tablet Take 1 tablet (5 mg total) by mouth daily. 10/03/16  Yes Strader, [place] M, PA-C  oxybutynin (DITROPAN-XL) 10 MG 24 hr tablet Take 10 mg by mouth at bedtime.   Yes [provider]  pantoprazole (PROTONIX) 40 MG tablet Take 1 tablet (40 mg total) by mouth daily. 08/26/16 08/26/17 Yes Clydia Llano, MD  pravastatin (PRAVACHOL) 80 MG tablet Take 80 mg by mouth daily.   Yes [provider]  tamsulosin (FLOMAX) 0.4 MG CAPS capsule Take 0.4 mg by mouth every morning.  08/20/16  Yes [provider]    Inpatient Medications: Scheduled Meds: . allopurinol  300 mg Oral Daily  . fenofibrate  160 mg Oral Daily  . finasteride  5 mg Oral Daily  . FLUoxetine  20 mg Oral Daily  . furosemide  40 mg Oral Daily  . Influenza vac split quadrivalent PF  0.5 mL Intramuscular Tomorrow-1000  . insulin aspart  0-20 Units Subcutaneous TID WC  . insulin aspart  0-5 Units Subcutaneous QHS  . insulin aspart  20 Units Subcutaneous TID WC  . insulin glargine  70 Units Subcutaneous QHS  . lisinopril  5 mg Oral Daily  . loratadine  10 mg Oral Daily  . oxybutynin  10 mg Oral QHS  . pantoprazole  40 mg Oral Daily  . polyethylene glycol  17 g Oral BID  . pravastatin  80 mg Oral q1800  . sodium chloride flush  3 mL Intravenous Q12H  . tamsulosin  0.4 mg Oral Daily   Continuous Infusions: . heparin 1,400 Units/hr (01/28/17 0547)   PRN Meds: acetaminophen **OR** acetaminophen, albuterol, HYDROcodone-acetaminophen, ondansetron **OR** ondansetron (ZOFRAN) IV  Allergies:   No Known Allergies  Social History:   Social History   Social History  . Marital  status: Widowed    Spouse name: N/A  . Number of children: N/A  . Years of education: N/A   Occupational History  . Not on file.   Social History Main Topics  . Smoking status: Former Games developer  . Smokeless tobacco: Never Used  . Alcohol use No  . Drug use: Unknown  . Sexual activity: Not on file   Other Topics Concern  . Not on file   Social History Narrative  . No narrative on file    Family History:    Family History  Problem Relation  Age of Onset  . Hypertension Mother   . Heart disease Mother   . Heart attack Father        died younger than pt's current age     ROS:  Please see the history of present illness.  ROS  General:no colds or fevers, no weight changes Skin:no rashes or ulcers HEENT:no blurred vision, no congestion, wears hearing aids  CV:see HPI PUL:see HPI GI:no diarrhea constipation or melena, no indigestion GU:no hematuria, no dysuria MS:+ hip pain, no claudication Neuro:no syncope, no lightheadedness Endo:+ diabetes with episodes of hypoglycemia at times, no thyroid disease      Physical Exam/Data:   Vitals:   01/27/17 1923 01/27/17 2133 01/27/17 2300 01/28/17 0412  BP: (!) 108/55  108/64 115/62  Pulse: 70 68 81 75  Resp: 18 17  18   Temp: 97.6 F (36.4 C)   97.8 F (36.6 C)  TempSrc: Oral   Oral  SpO2: 97% 97%  96%  Weight:    236 lb 3.2 oz (107.1 kg)  Height:        Intake/Output Summary (Last 24 hours) at 01/28/17 0900 Last data filed at 01/28/17 0700  Gross per 24 hour  Intake           761.93 ml  Output              670 ml  Net            91.93 ml   Filed Weights   01/26/17 2227 01/27/17 0507 01/28/17 0412  Weight: 236 lb 9.6 oz (107.3 kg) 236 lb 3.2 oz (107.1 kg) 236 lb 3.2 oz (107.1 kg)   Body mass index is 38.12 kg/m.  General:  Well nourished, well developed, in no acute distress HEENT: normal. Though hard of hearing with hearing aids but batteries dead.  Lymph: no adenopathy Neck: no JVD Endocrine:  No  thryomegaly Vascular: No carotid bruits; 1+ pedal pulses bil   Cardiac:  normal S1, S2; RRR; no murmur, gallup rub or click  Lungs:  clear to auscultation bilaterally, no wheezing, rhonchi or rales  Abd: obese,soft, nontender, no hepatomegaly  Ext: no edema to trace in rt leg Musculoskeletal:  No deformities, BUE and BLE strength normal and equal Skin: warm and dry  Neuro: alert and oriented X 3, MAE follows commands, + facial symmetry , no focal abnormalities noted Psych:  Normal affect    Relevant CV Studies: Echo 01/27/17 Study Conclusions  - Left ventricle: The cavity size was normal. Wall thickness was   normal. Systolic function was normal. The estimated ejection   fraction was in the range of 60% to 65%. Doppler parameters are   consistent with abnormal left ventricular relaxation (grade 1   diastolic dysfunction). - Aortic valve: There was trivial regurgitation. - Right ventricle: The cavity size was mildly dilated. Wall   thickness was normal. - Impressions: Very limited images due to poor sound wave   transmission.  Impressions:  - Very limited images due to poor sound wave transmission.   ECHO 08/21/16  Study Conclusions  - Left ventricle: The cavity size was normal. Wall thickness was   increased in a pattern of mild LVH. Left ventricular geometry   showed evidence of concentric remodeling. Systolic function was   normal. The estimated ejection fraction was in the range of 60%   to 65%. Wall motion was normal; there were no regional wall   motion abnormalities. Doppler parameters are consistent with   abnormal left  ventricular relaxation (grade 1 diastolic   dysfunction). Doppler parameters are consistent with elevated   mean left atrial filling pressure. - Aortic valve: There was mild regurgitation. -------------------------------------------------------------------  Laboratory Data:  Chemistry  Recent Labs Lab 01/26/17 1752 01/27/17 0530  01/28/17 0415  NA 138 134* 137  K 3.3* 4.0 3.7  CL 102 100* 102  CO2 25 18* 23  GLUCOSE 139* 428* 158*  BUN 36* 36* 40*  CREATININE 2.40* 2.59* 2.59*  CALCIUM 9.7 9.3 9.4  GFRNONAA 24* 22* 22*  GFRAA 28* 26* 26*  ANIONGAP 11 16* 12     Recent Labs Lab 01/26/17 1752  PROT 7.7  ALBUMIN 3.7  AST 21  ALT 12*  ALKPHOS 109  BILITOT 1.2   Hematology  Recent Labs Lab 01/26/17 1752 01/27/17 0530 01/28/17 0415  WBC 11.3* 11.7* 14.1*  RBC 4.75 4.37 4.55  HGB 15.6 14.4 14.7  HCT 45.4 41.3 43.6  MCV 95.6 94.5 95.8  MCH 32.8 33.0 32.3  MCHC 34.4 34.9 33.7  RDW 13.4 13.5 14.2  PLT 199 205 240   Cardiac Enzymes  Recent Labs Lab 01/26/17 1752 01/26/17 2241 01/27/17 0530 01/27/17 1246  TROPONINI <0.03 <0.03 <0.03 <0.03   No results for input(s): TROPIPOC in the last 168 hours.  BNP  Recent Labs Lab 01/26/17 1757  BNP 33.0    DDimer   Recent Labs Lab 01/26/17 1844  DDIMER 13.76*    Radiology/Studies:  Nm Pulmonary Perf And Vent  Result Date: 01/27/2017 CLINICAL DATA:  Are suspected pulmonary embolus. Shortness breath for 1 week. Taken off blood thinners several weeks ago. History of venous thrombosis and embolism. History of chronic kidney disease stage 3. History of smoking. EXAM: NUCLEAR MEDICINE VENTILATION - PERFUSION LUNG SCAN TECHNIQUE: Ventilation images were obtained in multiple projections using inhaled aerosol Tc-67m DTPA. Perfusion images were obtained in multiple projections after intravenous injection of Tc-39m MAA. RADIOPHARMACEUTICALS:  33.0 mCi Technetium-15m DTPA aerosol inhalation and 4.33 mCi Technetium-84m MAA IV COMPARISON:  08/21/2016 and chest x-ray 01/26/2017 FINDINGS: Ventilation: No focal ventilation defect. Perfusion: There is a moderate mismatch segmental defect in the right middle lobe region, without a chest x-ray correlate. This represents a new finding compared with prior V/Q scan performed in May. IMPRESSION: New moderate mismatched  segmental defects in the right middle lobe. Findings compatible with an intermediate probability for clinically significant pulmonary embolus. These results will be called to the ordering clinician or representative by the Radiologist Assistant, and communication documented in the PACS or zVision Dashboard. Electronically Signed   By: Norva Pavlov M.D.   On: 01/27/2017 13:34   US Venous Img Lower Bilateral  Result Date: 01/26/2017 CLINICAL DATA:  Right knee swelling for 2 weeks with shortness of breath, history of prior DVT EXAM: BILATERAL LOWER EXTREMITY VENOUS DOPPLER ULTRASOUND TECHNIQUE: Gray-scale sonography with graded compression, as well as color Doppler and duplex ultrasound were performed to evaluate the lower extremity deep venous systems from the level of the common femoral vein and including the common femoral, femoral, profunda femoral, popliteal and calf veins including the posterior tibial, peroneal and gastrocnemius veins when visible. The superficial great saphenous vein was also interrogated. Spectral Doppler was utilized to evaluate flow at rest and with distal augmentation maneuvers in the common femoral, femoral and popliteal veins. COMPARISON:  None. FINDINGS: RIGHT LOWER EXTREMITY Common Femoral Vein: Nearly occlusive thrombus. Partially compressible. Dampened waveform. Saphenofemoral Junction: Near occlusive thrombus. Partially compressible. Profunda Femoral Vein: Near occlusive thrombus. Minimal flow present. Femoral  Vein: Occlusive thrombus. Noncompressible. No significant flow. Popliteal Vein: Occlusive thrombus. Noncompressible. No significant flow. Calf Veins: Poorly visualized calf veins. Superficial Great Saphenous Vein: No evidence of thrombus at the ankle or upper thigh. Normal compressibility. Other Findings:  Probable thrombus in the gastrocnemius vein. LEFT LOWER EXTREMITY Common Femoral Vein: No evidence of thrombus. Normal compressibility, respiratory phasicity and  response to augmentation. Saphenofemoral Junction: No evidence of thrombus. Normal compressibility and flow on color Doppler imaging. Profunda Femoral Vein: No evidence of thrombus. Normal compressibility and flow on color Doppler imaging. Femoral Vein: No evidence of thrombus. Normal compressibility, respiratory phasicity and response to augmentation. Popliteal Vein: No evidence of thrombus. Normal compressibility, respiratory phasicity and response to augmentation. Calf Veins: Limited visualization. Superficial Great Saphenous Vein: No evidence of thrombus. Normal compressibility. Other Findings:  None. IMPRESSION: 1. Positive for acute deep venous thrombosis with extensive thrombus present in the right common femoral vein, saphenous femoral junction, profunda vein, femoral vein and popliteal vein. 2. Negative for acute DVT in the left lower extremity. Critical Value/emergent results were called by telephone at the time of interpretation on 01/26/2017 at 9:15 pm to Dr. Loren Racer , who verbally acknowledged these results. Electronically Signed   By: Jasmine Pang M.D.   On: 01/26/2017 21:16   Dg Chest Portable 1 View  Result Date: 01/26/2017 CLINICAL DATA:  Former smoker with history of asthma. Oxygen dependent. Shortness of breath today. EXAM: PORTABLE CHEST 1 VIEW COMPARISON:  08/23/2016 FINDINGS: Lungs are adequately inflated without focal consolidation or effusion. Mild stable cardiomegaly. Mild calcified plaque over the thoracic aorta. Degenerative change of the spine. IMPRESSION: No acute cardiopulmonary disease. Mild stable cardiomegaly. Electronically Signed   By: Elberta Fortis M.D.   On: 01/26/2017 18:16    Assessment and Plan:   1.  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  No need for PPM currently -pt asymptomatic.   No av node blocking agents no BB no CCB --if he were to develop  symptoms or during   --pt would like to resume driving- has been 6 months and no syncope OK to drive  2.  Acute PE and DVT on IV heparin --had been off pradaxa for 10 days after taking for 2 years for post knee surgery DVT.  More recently with ankle fx   3.  Chronic diastolic HF stable  4.  HTN controlled  5.  CKD 4  6.  IDDM per IM with controlled to low glucose.  7.  OSA  With cpap does use  Has follow up with Dr. Dulce Sellar in the [facility] office 02/06/17 at 1100 AM  For questions or updates, please contact [facility] Please consult [email or web address] for contact info under Cardiology/STEMI.   Signed, Nada Boozer, NP  01/28/2017 9:00 AM   Personally seen and examined. Agree with above.  76 year old male with obstructive sleep apnea on CPAP with known second-degree heart block type I, nocturnal predominant, with recent discontinuation of Pradaxa due to 2 years of PE/DVT treatment with unfortunate new PE diagnosis/DVT diagnosis.  On telemetry, most notable.  Bradycardia was noted at 2300 during sleep and demonstrated PR prolongation with transient AV block, approximately 2 P waves nonconducted with return of first-degree AV block then second-degree heart block type I.  This is very similar to his prior telemetry evaluation in May when electrophysiology consulted.  At that  time, an event monitor was placed and follow-up noted.  No indication for pacemaker at that time, nor do I believe pacemaker is indicated currently.   Exam: Overweight alert, regular rate and rhythm, trace pedal edema with mild skin sloughing noted in his pretibial region, moves all extremities.  When asked about his prior fall many months ago in the bathroom, he is adamant that he did not have syncope.  He states that he is "stubborn" and did not call for help immediately.  It has been 6 months since this episode.  He may resume driving.  He has not had any further possible syncopal episodes.  If lightheadedness were to  occur, he knows immediately to discontinue driving.  In all, no indication for pacemaker at this time.  He is at risk for further deterioration of his conduction system of course.  If symptomatic bradycardia were to occur during periods of movement, alert state, etc. he knows to seek medical attention.  Avoid AV nodal blocking agents such as beta blockers or calcium channel blockers.  We will sign off.  Please let us know we can be of further assistance.  Donato SchultzMark Ashleigh Luckow, MD

## 2017-01-28 NOTE — Progress Notes (Signed)
Patient ID: Joshua Dalton, male   DOB: 1940-08-04, 76 y.o.   MRN: [id number]  PROGRESS NOTE    Joshua Dalton  WUJ:[id number] DOB: February 24, 1941 DOA: 01/26/2017 PCP: Annita Brod, MD   Brief Narrative:  76 year old male with history of hypertension, hyperlipidemia, obstructive sleep apnea, chronic diastolic CHF, chronic kidney disease stage III, history of DVT recently taken off Pradaxa presented with shortness of breath and cough. Patient was admitted with acute extensive right lower extremity DVT along with suspected PE and started on heparin drip.  Assessment & Plan:   Principal Problem:   Acute DVT (deep venous thrombosis) (HCC) Active Problems:   DM2 (diabetes mellitus, type 2) (HCC)   HTN (hypertension)   Chronic kidney disease (CKD), stage III (moderate) (HCC)   SOB (shortness of breath)   Chronic diastolic CHF (congestive heart failure) (HCC)   Pulmonary embolism (HCC)  1. Probable acute pulmonary embolism - VQ scan shows intermediate probability of PE in the right middle lobe. Continue heparin drip for today. Echo shows ejection fraction of 60-65% with grade 1 diastolic dysfunction  2. Acute RLE DVT  - Continue heparin drip. Patient was on Pradaxa till almost 10 days prior to admission. Consider starting NOAC tomorrow, probably Eliquis, if renal function allows   3. Hypertension  - Monitor blood pressure. Continue lisinopril  4. Insulin-dependent DM uncontrolled with hyperglycemia and hypoglycemia - Follow hemoglobin A1c.  - Change Lantus to 35 units subcutaneous at bedtime and NovoLog to 10 units 3 times a day with meals  along with sliding scale coverage  5. Chronic diastolic CHF -Currently compensated. Strict input output, daily weights. Continue Lasix and lisinopril.   6. Hypokalemia  - Improved  7. CKD stage III-IV - Monitor creatinine. Currently stable  8. Leukocytosis - Probably reactive. Monitor    DVT prophylaxis: Heparin  drip Code Status:  Full Family Communication: None at bedside Disposition Plan: Depends on clinical outcome  Consultants: None  Procedures: Echo on 01/27/2017  ------------------------------------------------------------------- Study Conclusions  - Left ventricle: The cavity size was normal. Wall thickness was   normal. Systolic function was normal. The estimated ejection   fraction was in the range of 60% to 65%. Doppler parameters are   consistent with abnormal left ventricular relaxation (grade 1   diastolic dysfunction). - Aortic valve: There was trivial regurgitation. - Right ventricle: The cavity size was mildly dilated. Wall   thickness was normal. - Impressions: Very limited images due to poor sound wave   transmission.  Impressions:  - Very limited images due to poor sound wave transmission.  Antimicrobials: None    Subjective: Patient seen and examined at bedside. He denies current chest pain, nausea or vomiting. Is mildly short of breath. No complains of bleeding.  Objective: Vitals:   01/27/17 1923 01/27/17 2133 01/27/17 2300 01/28/17 0412  BP: (!) 108/55  108/64 115/62  Pulse: 70 68 81 75  Resp: 18 17  18   Temp: 97.6 F (36.4 C)   97.8 F (36.6 C)  TempSrc: Oral   Oral  SpO2: 97% 97%  96%  Weight:    107.1 kg (236 lb 3.2 oz)  Height:        Intake/Output Summary (Last 24 hours) at 01/28/17 0946 Last data filed at 01/28/17 0933  Gross per 24 hour  Intake          1241.93 ml  Output              670 ml  Net  571.93 ml   Filed Weights   01/26/17 2227 01/27/17 0507 01/28/17 0412  Weight: 107.3 kg (236 lb 9.6 oz) 107.1 kg (236 lb 3.2 oz) 107.1 kg (236 lb 3.2 oz)    Examination:  General exam: Appears calm and comfortable  Respiratory system: Bilateral decreased breath sound at bases with some basilar crackles Cardiovascular system: S1-S2 positive, rate controlled Gastrointestinal system: Abdomen is nondistended, soft and nontender.  Normal bowel sounds heard. Extremities: No cyanosis, clubbing; bilateral lower extremity edema worse on the right   Data Reviewed: I have personally reviewed following labs and imaging studies  CBC:  Recent Labs Lab 01/26/17 1752 01/27/17 0530 01/28/17 0415  WBC 11.3* 11.7* 14.1*  NEUTROABS  --  10.9* 10.3*  HGB 15.6 14.4 14.7  HCT 45.4 41.3 43.6  MCV 95.6 94.5 95.8  PLT 199 205 240   Basic Metabolic Panel:  Recent Labs Lab 01/26/17 1752 01/27/17 0530 01/28/17 0415  NA 138 134* 137  K 3.3* 4.0 3.7  CL 102 100* 102  CO2 25 18* 23  GLUCOSE 139* 428* 158*  BUN 36* 36* 40*  CREATININE 2.40* 2.59* 2.59*  CALCIUM 9.7 9.3 9.4  MG  --   --  2.1   GFR: Estimated Creatinine Clearance: 27.4 mL/min (A) (by C-G formula based on SCr of 2.59 mg/dL (H)). Liver Function Tests:  Recent Labs Lab 01/26/17 1752  AST 21  ALT 12*  ALKPHOS 109  BILITOT 1.2  PROT 7.7  ALBUMIN 3.7   No results for input(s): LIPASE, AMYLASE in the last 168 hours. No results for input(s): AMMONIA in the last 168 hours. Coagulation Profile: No results for input(s): INR, PROTIME in the last 168 hours. Cardiac Enzymes:  Recent Labs Lab 01/26/17 1752 01/26/17 2241 01/27/17 0530 01/27/17 1246  TROPONINI <0.03 <0.03 <0.03 <0.03   BNP (last 3 results) No results for input(s): PROBNP in the last 8760 hours. HbA1C: No results for input(s): HGBA1C in the last 72 hours. CBG:  Recent Labs Lab 01/27/17 2124 01/27/17 2203 01/27/17 2310 01/28/17 0304 01/28/17 0744  GLUCAP 66 78 98 141* 199*   Lipid Profile: No results for input(s): CHOL, HDL, LDLCALC, TRIG, CHOLHDL, LDLDIRECT in the last 72 hours. Thyroid Function Tests: No results for input(s): TSH, T4TOTAL, FREET4, T3FREE, THYROIDAB in the last 72 hours. Anemia Panel: No results for input(s): VITAMINB12, FOLATE, FERRITIN, TIBC, IRON, RETICCTPCT in the last 72 hours. Sepsis Labs:  Recent Labs Lab 01/26/17 1813 01/26/17 2050  01/27/17 0008 01/27/17 0522  LATICACIDVEN 2.78* 4.17* 4.6* 2.9*    Recent Results (from the past 240 hour(s))  Culture, blood (Routine X 2) w Reflex to ID Panel     Status: None (Preliminary result)   Collection Time: 01/26/17  5:55 PM  Result Value Ref Range Status   Specimen Description BLOOD RIGHT ANTECUBITAL  Final   Special Requests   Final    BOTTLES DRAWN AEROBIC AND ANAEROBIC Blood Culture adequate volume   Culture   Final    NO GROWTH < 12 HOURS Performed at [facility], [address] [place], [place] [address]    Report Status PENDING  Incomplete  Culture, blood (Routine X 2) w Reflex to ID Panel     Status: None (Preliminary result)   Collection Time: 01/26/17  6:10 PM  Result Value Ref Range Status   Specimen Description BLOOD BLOOD RIGHT HAND  Final   Special Requests   Final    IN PEDIATRIC BOTTLE Blood Culture results  may not be optimal due to an inadequate volume of blood received in culture bottles   Culture   Final    NO GROWTH < 12 HOURS Performed at [facility], [address] [place], [place] [address]    Report Status PENDING  Incomplete         Radiology Studies: Nm Pulmonary Perf And Vent  Result Date: 01/27/2017 CLINICAL DATA:  Are suspected pulmonary embolus. Shortness breath for 1 week. Taken off blood thinners several weeks ago. History of venous thrombosis and embolism. History of chronic kidney disease stage 3. History of smoking. EXAM: NUCLEAR MEDICINE VENTILATION - PERFUSION LUNG SCAN TECHNIQUE: Ventilation images were obtained in multiple projections using inhaled aerosol Tc-56m DTPA. Perfusion images were obtained in multiple projections after intravenous injection of Tc-55m MAA. RADIOPHARMACEUTICALS:  33.0 mCi Technetium-84m DTPA aerosol inhalation and 4.33 mCi Technetium-38m MAA IV COMPARISON:  08/21/2016 and chest x-ray 01/26/2017 FINDINGS: Ventilation: No focal ventilation defect. Perfusion: There is a moderate mismatch  segmental defect in the right middle lobe region, without a chest x-ray correlate. This represents a new finding compared with prior V/Q scan performed in May. IMPRESSION: New moderate mismatched segmental defects in the right middle lobe. Findings compatible with an intermediate probability for clinically significant pulmonary embolus. These results will be called to the ordering clinician or representative by the Radiologist Assistant, and communication documented in the PACS or zVision Dashboard. Electronically Signed   By: Norva Pavlov M.D.   On: 01/27/2017 13:34   US Venous Img Lower Bilateral  Result Date: 01/26/2017 CLINICAL DATA:  Right knee swelling for 2 weeks with shortness of breath, history of prior DVT EXAM: BILATERAL LOWER EXTREMITY VENOUS DOPPLER ULTRASOUND TECHNIQUE: Gray-scale sonography with graded compression, as well as color Doppler and duplex ultrasound were performed to evaluate the lower extremity deep venous systems from the level of the common femoral vein and including the common femoral, femoral, profunda femoral, popliteal and calf veins including the posterior tibial, peroneal and gastrocnemius veins when visible. The superficial great saphenous vein was also interrogated. Spectral Doppler was utilized to evaluate flow at rest and with distal augmentation maneuvers in the common femoral, femoral and popliteal veins. COMPARISON:  None. FINDINGS: RIGHT LOWER EXTREMITY Common Femoral Vein: Nearly occlusive thrombus. Partially compressible. Dampened waveform. Saphenofemoral Junction: Near occlusive thrombus. Partially compressible. Profunda Femoral Vein: Near occlusive thrombus. Minimal flow present. Femoral Vein: Occlusive thrombus. Noncompressible. No significant flow. Popliteal Vein: Occlusive thrombus. Noncompressible. No significant flow. Calf Veins: Poorly visualized calf veins. Superficial Great Saphenous Vein: No evidence of thrombus at the ankle or upper thigh. Normal  compressibility. Other Findings:  Probable thrombus in the gastrocnemius vein. LEFT LOWER EXTREMITY Common Femoral Vein: No evidence of thrombus. Normal compressibility, respiratory phasicity and response to augmentation. Saphenofemoral Junction: No evidence of thrombus. Normal compressibility and flow on color Doppler imaging. Profunda Femoral Vein: No evidence of thrombus. Normal compressibility and flow on color Doppler imaging. Femoral Vein: No evidence of thrombus. Normal compressibility, respiratory phasicity and response to augmentation. Popliteal Vein: No evidence of thrombus. Normal compressibility, respiratory phasicity and response to augmentation. Calf Veins: Limited visualization. Superficial Great Saphenous Vein: No evidence of thrombus. Normal compressibility. Other Findings:  None. IMPRESSION: 1. Positive for acute deep venous thrombosis with extensive thrombus present in the right common femoral vein, saphenous femoral junction, profunda vein, femoral vein and popliteal vein. 2. Negative for acute DVT in the left lower extremity. Critical Value/emergent results were called by telephone at the time of  interpretation on 01/26/2017 at 9:15 pm to Dr. Loren RacerAVID YELVERTON , who verbally acknowledged these results. Electronically Signed   By: Jasmine PangKim  Fujinaga M.D.   On: 01/26/2017 21:16   Dg Chest Portable 1 View  Result Date: 01/26/2017 CLINICAL DATA:  Former smoker with history of asthma. Oxygen dependent. Shortness of breath today. EXAM: PORTABLE CHEST 1 VIEW COMPARISON:  08/23/2016 FINDINGS: Lungs are adequately inflated without focal consolidation or effusion. Mild stable cardiomegaly. Mild calcified plaque over the thoracic aorta. Degenerative change of the spine. IMPRESSION: No acute cardiopulmonary disease. Mild stable cardiomegaly. Electronically Signed   By: Elberta Fortisaniel  Boyle M.D.   On: 01/26/2017 18:16        Scheduled Meds: . allopurinol  300 mg Oral Daily  . fenofibrate  160 mg Oral Daily   . finasteride  5 mg Oral Daily  . FLUoxetine  20 mg Oral Daily  . furosemide  40 mg Oral Daily  . Influenza vac split quadrivalent PF  0.5 mL Intramuscular Tomorrow-1000  . insulin aspart  0-20 Units Subcutaneous TID WC  . insulin aspart  0-5 Units Subcutaneous QHS  . insulin aspart  20 Units Subcutaneous TID WC  . insulin glargine  70 Units Subcutaneous QHS  . lisinopril  5 mg Oral Daily  . loratadine  10 mg Oral Daily  . oxybutynin  10 mg Oral QHS  . pantoprazole  40 mg Oral Daily  . polyethylene glycol  17 g Oral BID  . pravastatin  80 mg Oral q1800  . sodium chloride flush  3 mL Intravenous Q12H  . tamsulosin  0.4 mg Oral Daily   Continuous Infusions: . heparin 1,400 Units/hr (01/28/17 0547)     LOS: 1 day        Glade LloydKshitiz Iviona Hole, MD [facility] Pager [phone]  If 7PM-7AM, please contact night-coverage [email or web address] Password [email or web address] 01/28/2017, 9:46 AM

## 2017-01-28 NOTE — Progress Notes (Addendum)
Physical Therapy Treatment Patient Details Name: Joshua BowensRaymond Walter Guyett MRN: [id number] DOB: 08/31/1940 Today's Date: 01/28/2017    History of Present Illness Pt adm with RLE DVT and probable rt PE. PMH - DVT, ankle fx, dm, htn, chf    PT Comments    Pt doing well with mobility and no further PT needed.  Recommend pt continue to amb in hallway with staff to manage IV pole. Pt amb on RA with SpO2 97% or greater throughout.     Follow Up Recommendations  No PT follow up     Equipment Recommendations  None recommended by PT    Recommendations for Other Services       Precautions / Restrictions Precautions Precautions: None Restrictions Weight Bearing Restrictions: No    Mobility  Bed Mobility               General bed mobility comments: Pt sitting EOB  Transfers Overall transfer level: Modified independent Equipment used: None                Ambulation/Gait Ambulation/Gait assistance: Supervision (for IV pole in hallway only) Ambulation Distance (Feet): 400 Feet Assistive device: None Gait Pattern/deviations: Step-through pattern;Decreased stride length;Wide base of support Gait velocity: decr Gait velocity interpretation: Below normal speed for age/gender General Gait Details: Pt with steady gait. Amb on RA with SpO2 97% with amb   Stairs            Wheelchair Mobility    Modified Rankin (Stroke Patients Only)       Balance Overall balance assessment: Modified Independent                                          Cognition Arousal/Alertness: Awake/alert Behavior During Therapy: WFL for tasks assessed/performed Overall Cognitive Status: Within Functional Limits for tasks assessed                                        Exercises      General Comments        Pertinent Vitals/Pain Pain Assessment: No/denies pain    Home Living Family/patient expects to be discharged to:: Private  residence Living Arrangements: Alone Available Help at Discharge: Family;Available PRN/intermittently Type of Home: Independent living facility Home Access: Level entry   Home Layout: One level Home Equipment: Walker - 4 wheels;Other (comment) (oxygen) Additional Comments: Lives at [facility]    Prior Function Level of Independence: Independent  Gait / Transfers Assistance Needed: amb independently without assistive device   Comments: Meals provided at dining room or can be brought to room if needed   PT Goals (current goals can now be found in the care plan section) Acute Rehab PT Goals PT Goal Formulation: All assessment and education complete, DC therapy    Frequency           PT Plan      Co-evaluation              AM-PAC PT "6 Clicks" Daily Activity  Outcome Measure  Difficulty turning over in bed (including adjusting bedclothes, sheets and blankets)?: None Difficulty moving from lying on back to sitting on the side of the bed? : None Difficulty sitting down on and standing up from a chair with arms (e.g., wheelchair, bedside commode, etc,.)?: None  Help needed moving to and from a bed to chair (including a wheelchair)?: None Help needed walking in hospital room?: None Help needed climbing 3-5 steps with a railing? : A Little 6 Click Score: 23    End of Session   Activity Tolerance: Patient tolerated treatment well Patient left: in bed;with call bell/phone within reach (sitting EOB) Nurse Communication: Mobility status;Other (comment) (Good SpO2 on RA) PT Visit Diagnosis: Other abnormalities of gait and mobility (R26.89)     Time: 1610-9604 PT Time Calculation (min) (ACUTE ONLY): 19 min  Charges:                       G Codes:       [facility] PT [phone]    Angelina Ok [facility] 01/28/2017, 1:37 PM

## 2017-01-28 NOTE — Progress Notes (Signed)
Pt's CBG 66, no s/s, gave graham crackers and PB and OJ, rechecked 66, gave another OJ and crackers and PB, went to 78, will continue to monitor, thanks Glenna FellowsMike F RN.

## 2017-01-28 NOTE — Progress Notes (Signed)
Pt's rhythm changed to 2nd degree HB type 2, when pt's HR drops to the 40-50's but non-sustained, pt also had a pause of 3.63 sec, no s/s, MD notified and aware ordered ECG which showed 1st degree HB, MD also consulted with cardiology, will continue to monitor, Thanks Lavonda JumboMike F RN.

## 2017-01-28 NOTE — Progress Notes (Signed)
ANTICOAGULATION CONSULT NOTE Pharmacy Consult for Heparin  Indication: pulmonary embolus and DVT  No Known Allergies  Patient Measurements: Height: 5\' 6"  (167.6 cm) Weight: 236 lb 3.2 oz (107.1 kg) IBW/kg (Calculated) : 63.8 Vital Signs: Temp: 97.8 F (36.6 C) (10/28 0412) Temp Source: Oral (10/28 0412) BP: 115/62 (10/28 0412) Pulse Rate: 75 (10/28 0412)  Labs:  Recent Labs  01/26/17 1752 01/26/17 2241 01/27/17 0530 01/27/17 1246 01/27/17 2044 01/28/17 0415  HGB 15.6  --  14.4  --   --  14.7  HCT 45.4  --  41.3  --   --  43.6  PLT 199  --  205  --   --  240  HEPARINUNFRC  --   --   --  0.51 0.16* 0.97*  CREATININE 2.40*  --  2.59*  --   --  2.59*  TROPONINI <0.03 <0.03 <0.03 <0.03  --   --     Estimated Creatinine Clearance: 27.4 mL/min (A) (by C-G formula based on SCr of 2.59 mg/dL (H)).   Assessment: 76 y/o Male with DVT and probable PE for Heparin   Goal of Therapy:  Heparin level 0.3-0.7 units/ml Monitor platelets by anticoagulation protocol: Yes   Plan:  Decrease Heparin 1400 units/hr Check heparin level in 8 hours.   Joshua Dalton, Gary FleetGregory Vernon 01/28/2017,5:44 AM

## 2017-01-29 DIAGNOSIS — I82409 Acute embolism and thrombosis of unspecified deep veins of unspecified lower extremity: Secondary | ICD-10-CM

## 2017-01-29 LAB — BASIC METABOLIC PANEL
Anion gap: 9 (ref 5–15)
BUN: 40 mg/dL — AB (ref 6–20)
CALCIUM: 9.1 mg/dL (ref 8.9–10.3)
CO2: 25 mmol/L (ref 22–32)
CREATININE: 2.58 mg/dL — AB (ref 0.61–1.24)
Chloride: 101 mmol/L (ref 101–111)
GFR calc Af Amer: 26 mL/min — ABNORMAL LOW (ref >60)
GFR, EST NON AFRICAN AMERICAN: 22 mL/min — AB (ref >60)
GLUCOSE: 214 mg/dL — AB (ref 65–99)
POTASSIUM: 4.1 mmol/L (ref 3.5–5.1)
SODIUM: 135 mmol/L (ref 135–145)

## 2017-01-29 LAB — GLUCOSE, CAPILLARY
GLUCOSE-CAPILLARY: 67 mg/dL (ref 65–99)
Glucose-Capillary: 226 mg/dL — ABNORMAL HIGH (ref 65–99)
Glucose-Capillary: 208 mg/dL — ABNORMAL HIGH (ref 65–99)
Glucose-Capillary: 241 mg/dL — ABNORMAL HIGH (ref 65–99)

## 2017-01-29 LAB — CBC WITH DIFFERENTIAL/PLATELET
Basophils Absolute: 0 10*3/uL (ref 0.0–0.1)
Basophils Relative: 0 %
EOS ABS: 0.5 10*3/uL (ref 0.0–0.7)
EOS PCT: 5 %
HCT: 42.6 % (ref 39.0–52.0)
Hemoglobin: 14.4 g/dL (ref 13.0–17.0)
LYMPHS ABS: 2.2 10*3/uL (ref 0.7–4.0)
LYMPHS PCT: 24 %
MCH: 32.9 pg (ref 26.0–34.0)
MCHC: 33.8 g/dL (ref 30.0–36.0)
MCV: 97.3 fL (ref 78.0–100.0)
MONO ABS: 0.7 10*3/uL (ref 0.1–1.0)
MONOS PCT: 8 %
Neutro Abs: 5.9 10*3/uL (ref 1.7–7.7)
Neutrophils Relative %: 63 %
PLATELETS: 248 10*3/uL (ref 150–400)
RBC: 4.38 MIL/uL (ref 4.22–5.81)
RDW: 14.3 % (ref 11.5–15.5)
WBC: 9.3 10*3/uL (ref 4.0–10.5)

## 2017-01-29 LAB — MAGNESIUM: MAGNESIUM: 2.2 mg/dL (ref 1.7–2.4)

## 2017-01-29 LAB — HEMOGLOBIN A1C
Hgb A1c MFr Bld: 9.3 % — ABNORMAL HIGH (ref 4.8–5.6)
Mean Plasma Glucose: 220 mg/dL

## 2017-01-29 LAB — HEPARIN LEVEL (UNFRACTIONATED): Heparin Unfractionated: 0.41 IU/mL (ref 0.30–0.70)

## 2017-01-29 MED ORDER — APIXABAN 5 MG PO TABS
10.0000 mg | ORAL_TABLET | Freq: Two times a day (BID) | ORAL | Status: DC
  Administered 2017-01-29 – 2017-01-30 (×3): 10 mg via ORAL
  Filled 2017-01-29 (×3): qty 2

## 2017-01-29 MED ORDER — APIXABAN 5 MG PO TABS: 5.0000 mg | ORAL_TABLET | Freq: Two times a day (BID) | ORAL | Status: DC

## 2017-01-29 NOTE — Progress Notes (Signed)
Patient ID: Joshua Dalton, male   DOB: 18-May-1940, 76 y.o.   MRN: [id number]  PROGRESS NOTE    Joshua Dalton  YNW:[id number] DOB: January 02, 1941 DOA: 01/26/2017 PCP: Annita Brod, MD   Brief Narrative:  76 year old male with history of hypertension, hyperlipidemia, obstructive sleep apnea, chronic diastolic CHF, chronic kidney disease stage III, history of DVT recently taken off Pradaxa presented with shortness of breath and cough. Patient was admitted with acute extensive right lower extremity DVT along with suspected PE and started on heparin drip.  Assessment & Plan:   Principal Problem:   Acute DVT (deep venous thrombosis) (HCC) Active Problems:   DM2 (diabetes mellitus, type 2) (HCC)   HTN (hypertension)   Chronic kidney disease (CKD), stage III (moderate) (HCC)   SOB (shortness of breath)   Chronic diastolic CHF (congestive heart failure) (HCC)   Pulmonary embolism (HCC)  1. Probable acute pulmonary embolism and acute RLE DVT - VQ scan shows intermediate probability of PE in the right middle lobe. Currently on heparin drip. Start Eliquis today; DC heparin. Echo shows ejection fraction of 60-65% with grade 1 diastolic dysfunction - Patient was on Pradaxa till almost 10 days prior to admission - PT eval; care management consult for home care needs  2. Hypertension  - Monitor blood pressure. Continue lisinopril  3. Insulin-dependent DM uncontrolled with hyperglycemia and hypoglycemia - Follow hemoglobin A1c.  - Continue Lantus and NovoLog along with sliding scale coverage  5. Chronic diastolic CHF -Currently compensated. Strict input output, daily weights. Continue Lasix and lisinopril.   6. Hypokalemia  - Improved  7. CKD stage III-IV - Monitor creatinine. Currently stable  8. Leukocytosis - Probably reactive. Resolved  9. Nocturnal bradycardia - Cardiology evaluation appreciated. Avoid beta blockers or calcium channel blockers. Monitor; no  further intervention. Outpatient follow-up  DVT prophylaxis: Heparin drip Code Status:  Full Family Communication: None at bedside Disposition Plan: Probable discharge home tomorrow  Consultants: Cardiology  Procedures: Echo on 01/27/2017  ------------------------------------------------------------------- Study Conclusions  - Left ventricle: The cavity size was normal. Wall thickness was   normal. Systolic function was normal. The estimated ejection   fraction was in the range of 60% to 65%. Doppler parameters are   consistent with abnormal left ventricular relaxation (grade 1   diastolic dysfunction). - Aortic valve: There was trivial regurgitation. - Right ventricle: The cavity size was mildly dilated. Wall   thickness was normal. - Impressions: Very limited images due to poor sound wave   transmission.  Impressions:  - Very limited images due to poor sound wave transmission.  Antimicrobials: None    Subjective: Patient seen and examined at bedside. He feels much better; no current chest pain, nausea or vomiting. No signs of bleeding.  Objective: Vitals:   01/28/17 1106 01/28/17 1203 01/28/17 2016 01/29/17 0556  BP: 132/65 (!) 104/56 (!) 99/51 (!) 93/58  Pulse: 74 64 83 82  Resp: 18 20 20 20   Temp: 97.8 F (36.6 C) 97.6 F (36.4 C) (!) 97.5 F (36.4 C) 97.8 F (36.6 C)  TempSrc: Oral Oral Oral Oral  SpO2: 98% 98% 94% 95%  Weight:    106.9 kg (235 lb 9.6 oz)  Height:        Intake/Output Summary (Last 24 hours) at 01/29/17 0837 Last data filed at 01/29/17 0826  Gross per 24 hour  Intake              960 ml  Output  320 ml  Net              640 ml   Filed Weights   01/27/17 0507 01/28/17 0412 01/29/17 0556  Weight: 107.1 kg (236 lb 3.2 oz) 107.1 kg (236 lb 3.2 oz) 106.9 kg (235 lb 9.6 oz)    Examination:  General exam: Appears calm and comfortable  Respiratory system: Bilateral decreased breath sound at bases with some scattered  crackles Cardiovascular system: S1-S2 positive, rate controlled Gastrointestinal system: Abdomen is nondistended, soft and nontender. Normal bowel sounds heard. Extremities: No cyanosis, clubbing; bilateral lower extremity edema worse on the right   Data Reviewed: I have personally reviewed following labs and imaging studies  CBC:  Recent Labs Lab 01/26/17 1752 01/27/17 0530 01/28/17 0415 01/29/17 0534  WBC 11.3* 11.7* 14.1* 9.3  NEUTROABS  --  10.9* 10.3* 5.9  HGB 15.6 14.4 14.7 14.4  HCT 45.4 41.3 43.6 42.6  MCV 95.6 94.5 95.8 97.3  PLT 199 205 240 248   Basic Metabolic Panel:  Recent Labs Lab 01/26/17 1752 01/27/17 0530 01/28/17 0415 01/29/17 0534  NA 138 134* 137 135  K 3.3* 4.0 3.7 4.1  CL 102 100* 102 101  CO2 25 18* 23 25  GLUCOSE 139* 428* 158* 214*  BUN 36* 36* 40* 40*  CREATININE 2.40* 2.59* 2.59* 2.58*  CALCIUM 9.7 9.3 9.4 9.1  MG  --   --  2.1 2.2   GFR: Estimated Creatinine Clearance: 27.5 mL/min (A) (by C-G formula based on SCr of 2.58 mg/dL (H)). Liver Function Tests:  Recent Labs Lab 01/26/17 1752  AST 21  ALT 12*  ALKPHOS 109  BILITOT 1.2  PROT 7.7  ALBUMIN 3.7   No results for input(s): LIPASE, AMYLASE in the last 168 hours. No results for input(s): AMMONIA in the last 168 hours. Coagulation Profile: No results for input(s): INR, PROTIME in the last 168 hours. Cardiac Enzymes:  Recent Labs Lab 01/26/17 1752 01/26/17 2241 01/27/17 0530 01/27/17 1246  TROPONINI <0.03 <0.03 <0.03 <0.03   BNP (last 3 results) No results for input(s): PROBNP in the last 8760 hours. HbA1C: No results for input(s): HGBA1C in the last 72 hours. CBG:  Recent Labs Lab 01/28/17 0744 01/28/17 1105 01/28/17 1633 01/28/17 2115 01/29/17 0751  GLUCAP 199* 195* 153* 153* 226*   Lipid Profile: No results for input(s): CHOL, HDL, LDLCALC, TRIG, CHOLHDL, LDLDIRECT in the last 72 hours. Thyroid Function Tests: No results for input(s): TSH, T4TOTAL,  FREET4, T3FREE, THYROIDAB in the last 72 hours. Anemia Panel: No results for input(s): VITAMINB12, FOLATE, FERRITIN, TIBC, IRON, RETICCTPCT in the last 72 hours. Sepsis Labs:  Recent Labs Lab 01/26/17 1813 01/26/17 2050 01/27/17 0008 01/27/17 0522  LATICACIDVEN 2.78* 4.17* 4.6* 2.9*    Recent Results (from the past 240 hour(s))  Culture, blood (Routine X 2) w Reflex to ID Panel     Status: None (Preliminary result)   Collection Time: 01/26/17  5:55 PM  Result Value Ref Range Status   Specimen Description BLOOD RIGHT ANTECUBITAL  Final   Special Requests   Final    BOTTLES DRAWN AEROBIC AND ANAEROBIC Blood Culture adequate volume   Culture   Final    NO GROWTH 2 DAYS Performed at [facility], [address] [place], [place] [address]    Report Status PENDING  Incomplete  Culture, blood (Routine X 2) w Reflex to ID Panel     Status: None (Preliminary result)   Collection Time: 01/26/17  6:10 PM  Result Value Ref Range Status   Specimen Description BLOOD BLOOD RIGHT HAND  Final   Special Requests   Final    IN PEDIATRIC BOTTLE Blood Culture results may not be optimal due to an inadequate volume of blood received in culture bottles   Culture   Final    NO GROWTH 2 DAYS Performed at [facility], [address], [place], [place] [address]    Report Status PENDING  Incomplete         Radiology Studies: Nm Pulmonary Perf And Vent  Result Date: 01/27/2017 CLINICAL DATA:  Are suspected pulmonary embolus. Shortness breath for 1 week. Taken off blood thinners several weeks ago. History of venous thrombosis and embolism. History of chronic kidney disease stage 3. History of smoking. EXAM: NUCLEAR MEDICINE VENTILATION - PERFUSION LUNG SCAN TECHNIQUE: Ventilation images were obtained in multiple projections using inhaled aerosol Tc-57m DTPA. Perfusion images were obtained in multiple projections after intravenous injection of Tc-66m MAA. RADIOPHARMACEUTICALS:  33.0 mCi  Technetium-34m DTPA aerosol inhalation and 4.33 mCi Technetium-41m MAA IV COMPARISON:  08/21/2016 and chest x-ray 01/26/2017 FINDINGS: Ventilation: No focal ventilation defect. Perfusion: There is a moderate mismatch segmental defect in the right middle lobe region, without a chest x-ray correlate. This represents a new finding compared with prior V/Q scan performed in May. IMPRESSION: New moderate mismatched segmental defects in the right middle lobe. Findings compatible with an intermediate probability for clinically significant pulmonary embolus. These results will be called to the ordering clinician or representative by the Radiologist Assistant, and communication documented in the PACS or zVision Dashboard. Electronically Signed   By: Norva Pavlov M.D.   On: 01/27/2017 13:34        Scheduled Meds: . allopurinol  300 mg Oral Daily  . fenofibrate  160 mg Oral Daily  . finasteride  5 mg Oral Daily  . FLUoxetine  20 mg Oral Daily  . furosemide  40 mg Oral Daily  . insulin aspart  0-20 Units Subcutaneous TID WC  . insulin aspart  0-5 Units Subcutaneous QHS  . insulin aspart  10 Units Subcutaneous TID WC  . insulin glargine  35 Units Subcutaneous QHS  . lisinopril  5 mg Oral Daily  . loratadine  10 mg Oral Daily  . oxybutynin  10 mg Oral QHS  . pantoprazole  40 mg Oral Daily  . polyethylene glycol  17 g Oral BID  . pravastatin  80 mg Oral q1800  . sodium chloride flush  3 mL Intravenous Q12H  . tamsulosin  0.4 mg Oral Daily   Continuous Infusions: . heparin 1,400 Units/hr (01/28/17 0547)     LOS: 2 days        Glade Lloyd, MD [facility] Pager [phone]  If 7PM-7AM, please contact night-coverage [email or web address] Password [facility] 01/29/2017, 8:37 AM

## 2017-01-29 NOTE — Progress Notes (Signed)
Inpatient Diabetes Program Recommendations  AACE/ADA: New Consensus Statement on Inpatient Glycemic Control (2015)  Target Ranges:  Prepandial:   less than 140 mg/dL      Peak postprandial:   less than 180 mg/dL (1-2 hours)      Critically ill patients:  140 - 180 mg/dL   Lab Results  Component Value Date   GLUCAP 226 (H) 01/29/2017    Review of Glycemic Control  Diabetes history: Type 2 on insulin  Outpatient Diabetes medications: Lantus 70 units and novolog meal coverage 20 units tidwc Current orders for Inpatient glycemic control: Lantus 35 units, 10 units mc and resistant and HS correction scale  Inpatient Diabetes Program Recommendations:    Please consider increase in lantus closer to home to dose as fastings are high Consider increase to 50 units and continue the meal coverage as ordered.  Thank you Lenor CoffinAnn Connelly Spruell, RN, MSN, CDE  Diabetes Inpatient Program Office: [phone] Pager: [phone] 8:00 am to 5:00 pm

## 2017-01-29 NOTE — Care Management Note (Signed)
Case Management Note  Patient Details  Name: Joshua Dalton MRN: [id number] Date of Birth: 10/20/1940  Subjective/Objective:     Acute DVT              Action/Plan: PCP: Annita BrodAsenso, Philip, MD; has private insurance with [employer] with prescription drug coverage; CM following for DCP  Expected Discharge Date:   possibly 01/31/2017               Expected Discharge Plan:  Home/Self Care  Discharge planning Services  CM Consult  Status of Service:  In process, will continue to follow  Reola MosherChandler, Hymie Gorr L, RN,MHA,BSN [phone] 01/29/2017, 11:11 AM

## 2017-01-29 NOTE — Progress Notes (Signed)
ANTICOAGULATION CONSULT NOTE   Pharmacy Consult for Elquis  Indication:  pulmonary embolus and RLE DVT  No Known Allergies  Patient Measurements: Height: 5\' 6"  (167.6 cm) Weight: 235 lb 9.6 oz (106.9 kg) IBW/kg (Calculated) : 63.8  Vital Signs: Temp: 97.8 F (36.6 C) (10/29 0556) Temp Source: Oral (10/29 0556) BP: 93/58 (10/29 0556) Pulse Rate: 82 (10/29 0556)  Labs:  Recent Labs  01/26/17 2241 01/27/17 0530 01/27/17 1246  01/28/17 0415 01/28/17 1424 01/29/17 0534  HGB  --  14.4  --   --  14.7  --  14.4  HCT  --  41.3  --   --  43.6  --  42.6  PLT  --  205  --   --  240  --  248  HEPARINUNFRC  --   --  0.51  < > 0.97* 0.67 0.41  CREATININE  --  2.59*  --   --  2.59*  --  2.58*  TROPONINI <0.03 <0.03 <0.03  --   --   --   --   < > = values in this interval not displayed.  Estimated Creatinine Clearance: 27.5 mL/min (A) (by C-G formula based on SCr of 2.58 mg/dL (H)).   Medical History: Past Medical History:  Diagnosis Date  . Asthma   . Chronic diastolic (congestive) heart failure (HCC)    a. echo in 08/2016 showed a preserved EF of 60-65%, Grade 1 DD, and no WMA. Mild AI noted.   . Chronic kidney disease, stage III (moderate) (HCC)   . Diabetes mellitus without complication (HCC)   . Heart disease   . Hypercholesteremia   . Hypertension   . Obstructive sleep apnea   . Personal history of venous thrombosis and embolism     Medications:  Prescriptions Prior to Admission  Medication Sig Dispense Refill Last Dose  . allopurinol (ZYLOPRIM) 300 MG tablet Take 300 mg by mouth daily.   01/26/2017 at am  . fenofibrate (TRICOR) 145 MG tablet Take 145 mg by mouth daily.   01/26/2017 at am  . fexofenadine (ALLEGRA) 180 MG tablet Take 180 mg by mouth daily.   01/26/2017 at am  . finasteride (PROSCAR) 5 MG tablet Take 5 mg by mouth daily.   01/26/2017 at am  . FLUoxetine (PROZAC) 20 MG capsule Take 20 mg by mouth daily.   01/26/2017 at am  . furosemide (LASIX) 40 MG  tablet TAKE 1 TABLET BY MOUTH IN THE MORNING AND 1/2 IN THE EVENING (Patient taking differently: Take 20-40 mg by mouth See admin instructions. 40 mg in the morning and 20 mg in the evening) 45 tablet 5 01/26/2017 at am  . insulin aspart (NOVOLOG) 100 UNIT/ML injection Inject 20 Units into the skin 3 (three) times daily before meals.    01/26/2017 at pm  . insulin glargine (LANTUS) 100 UNIT/ML injection Inject 70 Units into the skin at bedtime.   01/25/2017 at pm  . lisinopril (PRINIVIL,ZESTRIL) 5 MG tablet Take 1 tablet (5 mg total) by mouth daily. 30 tablet 5 01/26/2017 at am  . oxybutynin (DITROPAN-XL) 10 MG 24 hr tablet Take 10 mg by mouth at bedtime.   01/25/2017 at pm  . pantoprazole (PROTONIX) 40 MG tablet Take 1 tablet (40 mg total) by mouth daily. 30 tablet 1 01/26/2017 at am  . pravastatin (PRAVACHOL) 80 MG tablet Take 80 mg by mouth daily.   01/26/2017 at am  . tamsulosin (FLOMAX) 0.4 MG CAPS capsule Take 0.4 mg by mouth  every morning.    01/26/2017 at am   Assessment: [age] YOM here with extreme shortness of breath with exertion. He was recently taken off Pradaxa. Lower extremity dopplers positive for acute DVT in the RLE. VQ scan results probable PE.  Heparin level therapeutic: 0.41 on IV heparin drip 1400 units/hr CBC stable with AM labs; no bleeding reported. transition to oral anticoagulation with Eliquis today   Goal of Therapy:  Heparin level 0.3-0.7 units/ml Monitor platelets by anticoagulation protocol: Yes   Plan:  Discontinue IV heparin now , transition to oral anticoagulation Eliquis Give Eliquis 10mg  bid x 7 days (start at time of heparin dc) , then on 11/5 reduce dose of Eliquis to 5mg  po BID.  -Monitor daily for s/sx of bleeding  - Will educate patient on Eliquis prior to discharge.   Noah Delaineuth Jago Carton, RPh Clinical Pharmacist Pager: [phone] [phone] phone [phone] Main pharmacy [phone] 01/29/2017 10:01 AM

## 2017-01-30 LAB — BASIC METABOLIC PANEL
ANION GAP: 12 (ref 5–15)
BUN: 38 mg/dL — ABNORMAL HIGH (ref 6–20)
CALCIUM: 9.6 mg/dL (ref 8.9–10.3)
CO2: 23 mmol/L (ref 22–32)
CREATININE: 2.57 mg/dL — AB (ref 0.61–1.24)
Chloride: 104 mmol/L (ref 101–111)
GFR, EST AFRICAN AMERICAN: 26 mL/min — AB (ref >60)
GFR, EST NON AFRICAN AMERICAN: 23 mL/min — AB (ref >60)
Glucose, Bld: 187 mg/dL — ABNORMAL HIGH (ref 65–99)
Potassium: 4.3 mmol/L (ref 3.5–5.1)
Sodium: 139 mmol/L (ref 135–145)

## 2017-01-30 LAB — GLUCOSE, CAPILLARY
GLUCOSE-CAPILLARY: 193 mg/dL — AB (ref 65–99)
Glucose-Capillary: 139 mg/dL — ABNORMAL HIGH (ref 65–99)

## 2017-01-30 LAB — MAGNESIUM: Magnesium: 2.1 mg/dL (ref 1.7–2.4)

## 2017-01-30 MED ORDER — INSULIN GLARGINE 100 UNIT/ML ~~LOC~~ SOLN: 45.0000 [IU] | Freq: Every day | SUBCUTANEOUS | Status: DC

## 2017-01-30 MED ORDER — INSULIN ASPART 100 UNIT/ML ~~LOC~~ SOLN: 10.0000 [IU] | Freq: Three times a day (TID) | SUBCUTANEOUS | Status: DC

## 2017-01-30 MED ORDER — APIXABAN 5 MG PO TABS: ORAL_TABLET | ORAL | 0 refills | Status: AC

## 2017-01-30 NOTE — Discharge Summary (Signed)
Physician Discharge Summary  Joshua BowensRaymond Walter Dalton ZOX:[id number] DOB: 10/27/1940 DOA: 01/26/2017  PCP: Annita BrodAsenso, Philip, MD  Admit date: 01/26/2017 Discharge date: 01/30/2017  Admitted From: Home Disposition:  Home  Recommendations for Outpatient Follow-up:  1. Follow up with PCP in 1week with repeat CBC/BMP 2. Follow-up with cardiology as scheduled 3. Follow-up in the ED if symptoms worsen or new appear   Home Health: No  Equipment/Devices: None  Discharge Condition: Stable  CODE STATUS: Full  Diet recommendation: Heart Healthy / Carb Modified/fluid restriction up to 1500 ML per day  Brief/Interim Summary: 76 year old male with history of hypertension, hyperlipidemia, obstructive sleep apnea, chronic diastolic CHF, chronic kidney disease stage III, history of DVT recently taken off Pradaxa presented with shortness of breath and cough. Patient was admitted with acute extensive right lower extremity DVT along with suspected PE and started on heparin drip. He has been switched to oral Eliquis. Is stable for discharge.  Discharge Diagnoses:  Principal Problem:   Acute DVT (deep venous thrombosis) (HCC) Active Problems:   DM2 (diabetes mellitus, type 2) (HCC)   HTN (hypertension)   Chronic kidney disease (CKD), stage III (moderate) (HCC)   SOB (shortness of breath)   Chronic diastolic CHF (congestive heart failure) (HCC)   Pulmonary embolism (HCC)  1. Probable acute pulmonary embolism and acute RLE DVT - VQ scan shows intermediate probability of PE in the right middle lobe. Status post heparin drip. Started Eliquis today on 01/29/2017; continue Eliquis 10 mg by mouth twice a day till 02/04/2017 then switched to 5 mg by mouth twice a day from 02/05/2017 onwards.  Echo shows ejection fraction of 60-65% with grade 1 diastolic dysfunction - Patient was on Pradaxa till almost 10 days prior to admission - Discharge patient home today with outpatient follow-up with primary care provider  with repeat CBC/BMP in a week  2. Hypertension  - Monitor blood pressure. Continue lisinopril  3. Insulin-dependent DM uncontrolled with hyperglycemia and hypoglycemia - Outpatient follow-up with primary care provider. Decrease Lantus to 45 units at bedtime and NovoLog to 15 units 3 times a day with meals  5. Chronic diastolic CHF -Currently compensated. Continue Lasix and lisinopril. Outpatient follow-up with cardiology  6. Hypokalemia  - Improved  7. CKD stage III-IV - Monitor creatinine. Currently stable. Outpatient follow-up  8. Leukocytosis - Probably reactive. Resolved  9. Nocturnal bradycardia - Cardiology evaluation appreciated. Avoid beta blockers or calcium channel blockers. Monitor; no further intervention. Outpatient follow-up   Discharge Instructions  Discharge Instructions    Call MD for:  difficulty breathing, headache or visual disturbances    Complete by:  As directed    Call MD for:  extreme fatigue    Complete by:  As directed    Call MD for:  hives    Complete by:  As directed    Call MD for:  persistant dizziness or light-headedness    Complete by:  As directed    Call MD for:  persistant nausea and vomiting    Complete by:  As directed    Call MD for:  severe uncontrolled pain    Complete by:  As directed    Call MD for:  temperature >100.4    Complete by:  As directed    Diet - low sodium heart healthy    Complete by:  As directed    Diet Carb Modified    Complete by:  As directed    Increase activity slowly    Complete by:  As  directed      Allergies as of 01/30/2017   No Known Allergies     Medication List    TAKE these medications   allopurinol 300 MG tablet Commonly known as:  ZYLOPRIM Take 300 mg by mouth daily.   apixaban 5 MG Tabs tablet Commonly known as:  ELIQUIS 10mg  BID till 02/04/17 then 5mg  BID from 02/05/17 onwards   fenofibrate 145 MG tablet Commonly known as:  TRICOR Take 145 mg by mouth daily.    fexofenadine 180 MG tablet Commonly known as:  ALLEGRA Take 180 mg by mouth daily.   finasteride 5 MG tablet Commonly known as:  PROSCAR Take 5 mg by mouth daily.   FLUoxetine 20 MG capsule Commonly known as:  PROZAC Take 20 mg by mouth daily.   furosemide 40 MG tablet Commonly known as:  LASIX TAKE 1 TABLET BY MOUTH IN THE MORNING AND 1/2 IN THE EVENING What changed:  how much to take  how to take this  when to take this  additional instructions   insulin aspart 100 UNIT/ML injection Commonly known as:  novoLOG Inject 10 Units into the skin 3 (three) times daily before meals. What changed:  how much to take   insulin glargine 100 UNIT/ML injection Commonly known as:  LANTUS Inject 0.45 mLs (45 Units total) into the skin at bedtime. What changed:  how much to take   lisinopril 5 MG tablet Commonly known as:  PRINIVIL,ZESTRIL Take 1 tablet (5 mg total) by mouth daily.   oxybutynin 10 MG 24 hr tablet Commonly known as:  DITROPAN-XL Take 10 mg by mouth at bedtime.   pantoprazole 40 MG tablet Commonly known as:  PROTONIX Take 1 tablet (40 mg total) by mouth daily.   pravastatin 80 MG tablet Commonly known as:  PRAVACHOL Take 80 mg by mouth daily.   tamsulosin 0.4 MG Caps capsule Commonly known as:  FLOMAX Take 0.4 mg by mouth every morning.      Follow-up Information    Baldo Daub, MD Follow up on 02/06/2017.   Specialty:  Cardiology Why:   at 11:00AM   Contact information: [address] [place] [address] [phone]        Annita Brod, MD. Schedule an appointment as soon as possible for a visit in 1 week(s).   Specialty:  Internal Medicine Why:  with repeat CBC/BMP Contact information: [address] [place] [address] [place] [address] [phone]          No Known Allergies  Consultations:  Cardiology   Procedures/Studies: Nm Pulmonary Perf And Vent  Result Date: 01/27/2017 CLINICAL  DATA:  Are suspected pulmonary embolus. Shortness breath for 1 week. Taken off blood thinners several weeks ago. History of venous thrombosis and embolism. History of chronic kidney disease stage 3. History of smoking. EXAM: NUCLEAR MEDICINE VENTILATION - PERFUSION LUNG SCAN TECHNIQUE: Ventilation images were obtained in multiple projections using inhaled aerosol Tc-2m DTPA. Perfusion images were obtained in multiple projections after intravenous injection of Tc-34m MAA. RADIOPHARMACEUTICALS:  33.0 mCi Technetium-69m DTPA aerosol inhalation and 4.33 mCi Technetium-5m MAA IV COMPARISON:  08/21/2016 and chest x-ray 01/26/2017 FINDINGS: Ventilation: No focal ventilation defect. Perfusion: There is a moderate mismatch segmental defect in the right middle lobe region, without a chest x-ray correlate. This represents a new finding compared with prior V/Q scan performed in May. IMPRESSION: New moderate mismatched segmental defects in the right middle lobe. Findings compatible with an intermediate probability for  clinically significant pulmonary embolus. These results will be called to the ordering clinician or representative by the Radiologist Assistant, and communication documented in the PACS or zVision Dashboard. Electronically Signed   By: Norva Pavlov M.D.   On: 01/27/2017 13:34   US Venous Img Lower Bilateral  Result Date: 01/26/2017 CLINICAL DATA:  Right knee swelling for 2 weeks with shortness of breath, history of prior DVT EXAM: BILATERAL LOWER EXTREMITY VENOUS DOPPLER ULTRASOUND TECHNIQUE: Gray-scale sonography with graded compression, as well as color Doppler and duplex ultrasound were performed to evaluate the lower extremity deep venous systems from the level of the common femoral vein and including the common femoral, femoral, profunda femoral, popliteal and calf veins including the posterior tibial, peroneal and gastrocnemius veins when visible. The superficial great saphenous vein was also  interrogated. Spectral Doppler was utilized to evaluate flow at rest and with distal augmentation maneuvers in the common femoral, femoral and popliteal veins. COMPARISON:  None. FINDINGS: RIGHT LOWER EXTREMITY Common Femoral Vein: Nearly occlusive thrombus. Partially compressible. Dampened waveform. Saphenofemoral Junction: Near occlusive thrombus. Partially compressible. Profunda Femoral Vein: Near occlusive thrombus. Minimal flow present. Femoral Vein: Occlusive thrombus. Noncompressible. No significant flow. Popliteal Vein: Occlusive thrombus. Noncompressible. No significant flow. Calf Veins: Poorly visualized calf veins. Superficial Great Saphenous Vein: No evidence of thrombus at the ankle or upper thigh. Normal compressibility. Other Findings:  Probable thrombus in the gastrocnemius vein. LEFT LOWER EXTREMITY Common Femoral Vein: No evidence of thrombus. Normal compressibility, respiratory phasicity and response to augmentation. Saphenofemoral Junction: No evidence of thrombus. Normal compressibility and flow on color Doppler imaging. Profunda Femoral Vein: No evidence of thrombus. Normal compressibility and flow on color Doppler imaging. Femoral Vein: No evidence of thrombus. Normal compressibility, respiratory phasicity and response to augmentation. Popliteal Vein: No evidence of thrombus. Normal compressibility, respiratory phasicity and response to augmentation. Calf Veins: Limited visualization. Superficial Great Saphenous Vein: No evidence of thrombus. Normal compressibility. Other Findings:  None. IMPRESSION: 1. Positive for acute deep venous thrombosis with extensive thrombus present in the right common femoral vein, saphenous femoral junction, profunda vein, femoral vein and popliteal vein. 2. Negative for acute DVT in the left lower extremity. Critical Value/emergent results were called by telephone at the time of interpretation on 01/26/2017 at 9:15 pm to Dr. Loren Racer , who verbally  acknowledged these results. Electronically Signed   By: Jasmine Pang M.D.   On: 01/26/2017 21:16   Dg Chest Portable 1 View  Result Date: 01/26/2017 CLINICAL DATA:  Former smoker with history of asthma. Oxygen dependent. Shortness of breath today. EXAM: PORTABLE CHEST 1 VIEW COMPARISON:  08/23/2016 FINDINGS: Lungs are adequately inflated without focal consolidation or effusion. Mild stable cardiomegaly. Mild calcified plaque over the thoracic aorta. Degenerative change of the spine. IMPRESSION: No acute cardiopulmonary disease. Mild stable cardiomegaly. Electronically Signed   By: Elberta Fortis M.D.   On: 01/26/2017 18:16    Echo on 01/27/2017  ------------------------------------------------------------------- Study Conclusions  - Left ventricle: The cavity size was normal. Wall thickness was normal. Systolic function was normal. The estimated ejection fraction was in the range of 60% to 65%. Doppler parameters are consistent with abnormal left ventricular relaxation (grade 1 diastolic dysfunction). - Aortic valve: There was trivial regurgitation. - Right ventricle: The cavity size was mildly dilated. Wall thickness was normal. - Impressions: Very limited images due to poor sound wave transmission.  Impressions:  - Very limited images due to poor sound wave transmission.   Subjective: Patient seen and examined at  bedside. He denies any overnight fever, nausea, vomiting or chest pain. He wants to go home  Discharge Exam: Vitals:   01/29/17 1918 01/30/17 0559  BP: (!) 108/58 115/64  Pulse: 94 66  Resp: 18 18  Temp: 97.7 F (36.5 C) (!) 97.5 F (36.4 C)  SpO2: 98% 98%   Vitals:   01/29/17 0556 01/29/17 1441 01/29/17 1918 01/30/17 0559  BP: (!) 93/58 (!) 102/39 (!) 108/58 115/64  Pulse: 82 89 94 66  Resp: 20 20 18 18   Temp: 97.8 F (36.6 C) (!) 97.5 F (36.4 C) 97.7 F (36.5 C) (!) 97.5 F (36.4 C)  TempSrc: Oral Oral Oral Oral  SpO2: 95% 97% 98% 98%   Weight: 106.9 kg (235 lb 9.6 oz)   106.7 kg (235 lb 3.2 oz)  Height:        General: Pt is alert, awake, not in acute distress Cardiovascular: Rate controlled, S1/S2 + Respiratory: Bilateral decreased breath sounds at bases Abdominal: Soft, NT, ND, bowel sounds + Extremities: No cyanosis, clubbing; mild bilateral lower extremity edema worse on the right     The results of significant diagnostics from this hospitalization (including imaging, microbiology, ancillary and laboratory) are listed below for reference.     Microbiology: Recent Results (from the past 240 hour(s))  Culture, blood (Routine X 2) w Reflex to ID Panel     Status: None (Preliminary result)   Collection Time: 01/26/17  5:55 PM  Result Value Ref Range Status   Specimen Description BLOOD RIGHT ANTECUBITAL  Final   Special Requests   Final    BOTTLES DRAWN AEROBIC AND ANAEROBIC Blood Culture adequate volume   Culture   Final    NO GROWTH 3 DAYS Performed at [facility], [address] [place], [place] [address]    Report Status PENDING  Incomplete  Culture, blood (Routine X 2) w Reflex to ID Panel     Status: None (Preliminary result)   Collection Time: 01/26/17  6:10 PM  Result Value Ref Range Status   Specimen Description BLOOD BLOOD RIGHT HAND  Final   Special Requests   Final    IN PEDIATRIC BOTTLE Blood Culture results may not be optimal due to an inadequate volume of blood received in culture bottles   Culture   Final    NO GROWTH 3 DAYS Performed at [facility], [address] [place], [place] [address]    Report Status PENDING  Incomplete     Labs: BNP (last 3 results)  Recent Labs  08/20/16 2016 01/26/17 1757  BNP 24.4 33.0   Basic Metabolic Panel:  Recent Labs Lab 01/26/17 1752 01/27/17 0530 01/28/17 0415 01/29/17 0534 01/30/17 0551  NA 138 134* 137 135 139  K 3.3* 4.0 3.7 4.1 4.3  CL 102 100* 102 101 104  CO2 25 18* 23 25 23   GLUCOSE 139* 428* 158* 214* 187*   BUN 36* 36* 40* 40* 38*  CREATININE 2.40* 2.59* 2.59* 2.58* 2.57*  CALCIUM 9.7 9.3 9.4 9.1 9.6  MG  --   --  2.1 2.2 2.1   Liver Function Tests:  Recent Labs Lab 01/26/17 1752  AST 21  ALT 12*  ALKPHOS 109  BILITOT 1.2  PROT 7.7  ALBUMIN 3.7   No results for input(s): LIPASE, AMYLASE in the last 168 hours. No results for input(s): AMMONIA in the last 168 hours. CBC:  Recent Labs Lab 01/26/17 1752 01/27/17 0530 01/28/17 0415 01/29/17 0534  WBC  11.3* 11.7* 14.1* 9.3  NEUTROABS  --  10.9* 10.3* 5.9  HGB 15.6 14.4 14.7 14.4  HCT 45.4 41.3 43.6 42.6  MCV 95.6 94.5 95.8 97.3  PLT 199 205 240 248   Cardiac Enzymes:  Recent Labs Lab 01/26/17 1752 01/26/17 2241 01/27/17 0530 01/27/17 1246  TROPONINI <0.03 <0.03 <0.03 <0.03   BNP: Invalid input(s): POCBNP CBG:  Recent Labs Lab 01/29/17 0751 01/29/17 1130 01/29/17 1622 01/29/17 2101 01/30/17 0742  GLUCAP 226* 208* 67 241* 193*   D-Dimer No results for input(s): DDIMER in the last 72 hours. Hgb A1c  Recent Labs  01/28/17 0906  HGBA1C 9.3*   Lipid Profile No results for input(s): CHOL, HDL, LDLCALC, TRIG, CHOLHDL, LDLDIRECT in the last 72 hours. Thyroid function studies No results for input(s): TSH, T4TOTAL, T3FREE, THYROIDAB in the last 72 hours.  Invalid input(s): FREET3 Anemia work up No results for input(s): VITAMINB12, FOLATE, FERRITIN, TIBC, IRON, RETICCTPCT in the last 72 hours. Urinalysis    Component Value Date/Time   COLORURINE YELLOW 01/27/2017 0010   APPEARANCEUR CLEAR 01/27/2017 0010   LABSPEC 1.011 01/27/2017 0010   PHURINE 6.0 01/27/2017 0010   GLUCOSEU >=500 (A) 01/27/2017 0010   HGBUR SMALL (A) 01/27/2017 0010   BILIRUBINUR NEGATIVE 01/27/2017 0010   KETONESUR NEGATIVE 01/27/2017 0010   PROTEINUR 30 (A) 01/27/2017 0010   NITRITE NEGATIVE 01/27/2017 0010   LEUKOCYTESUR NEGATIVE 01/27/2017 0010   Sepsis Labs Invalid input(s): PROCALCITONIN,  WBC,   LACTICIDVEN Microbiology Recent Results (from the past 240 hour(s))  Culture, blood (Routine X 2) w Reflex to ID Panel     Status: None (Preliminary result)   Collection Time: 01/26/17  5:55 PM  Result Value Ref Range Status   Specimen Description BLOOD RIGHT ANTECUBITAL  Final   Special Requests   Final    BOTTLES DRAWN AEROBIC AND ANAEROBIC Blood Culture adequate volume   Culture   Final    NO GROWTH 3 DAYS Performed at [facility], [address] [place], [place] [address]    Report Status PENDING  Incomplete  Culture, blood (Routine X 2) w Reflex to ID Panel     Status: None (Preliminary result)   Collection Time: 01/26/17  6:10 PM  Result Value Ref Range Status   Specimen Description BLOOD BLOOD RIGHT HAND  Final   Special Requests   Final    IN PEDIATRIC BOTTLE Blood Culture results may not be optimal due to an inadequate volume of blood received in culture bottles   Culture   Final    NO GROWTH 3 DAYS Performed at [facility], [address], [place], [place] [address]    Report Status PENDING  Incomplete     Time coordinating discharge: 35 minutes  SIGNED:   Glade Lloyd, MD  [facility] 01/30/2017, 8:53 AM Pager: [phone]  If 7PM-7AM, please contact night-coverage [email or web address] Password [email or web address]

## 2017-01-30 NOTE — Progress Notes (Signed)
Pt has orders to be discharged. Discharge instructions given and pt has no additional questions at this time. Medication regimen reviewed and pt educated. Pt verbalized understanding and has no additional questions. Telemetry box removed. IV removed and site in good condition. Pt stable and waiting for transportation. 

## 2017-01-30 NOTE — Evaluation (Addendum)
Physical Therapy Evaluation and Discharge Patient Details Name: Joshua Dalton MRN: [id number] DOB: 07/30/1940 Today's Date: 01/30/2017   History of Present Illness  Pt adm with RLE DVT and rt PE. PMH - DVT, ankle fx, dm, htn, chf  Clinical Impression  Pt is mod independent with transfers and supervision for ambulation of 350 feet without AD. PTA pt was receiving PT for ankle fx and was continuing to make progress with his gait training and endurance. Pt encouraged to continue his therapy. Pt also educated on need to increase his safety level with mobility. Pt has safety awareness but does not always make best choices where safety is concerned. All further PT needs can be address by HHPT.     Follow Up Recommendations Home health PT (pt is currently receiving PT at his ALF, continue )    Equipment Recommendations  None recommended by PT    Recommendations for Other Services       Precautions / Restrictions Precautions Precautions: None Restrictions Weight Bearing Restrictions: No      Mobility  Bed Mobility               General bed mobility comments: Pt sitting EOB  Transfers Overall transfer level: Modified independent Equipment used: None             General transfer comment: safe and steady with transfers  Ambulation/Gait Ambulation/Gait assistance: Supervision (for IV pole in hallway only)   Assistive device: None Gait Pattern/deviations: Step-through pattern;Decreased stride length;Wide base of support Gait velocity: decr Gait velocity interpretation: Below normal speed for age/gender General Gait Details: Pt with steady gait, limit R dorsiflexion       Balance Overall balance assessment: Modified Independent                                           Pertinent Vitals/Pain Pain Assessment: No/denies pain    Home Living Family/patient expects to be discharged to:: Private residence Living Arrangements:  Alone Available Help at Discharge: Family;Available PRN/intermittently Type of Home: Independent living facility Home Access: Elevator     Home Layout: One level Home Equipment: [place] - 4 wheels;Other (comment) (oxygen) Additional Comments: Lives at [facility]    Prior Function Level of Independence: Independent   Gait / Transfers Assistance Needed: amb independently without assistive device     Comments: Meals provided at dining room or can be brought to room if needed        Extremity/Trunk Assessment   Upper Extremity Assessment Upper Extremity Assessment: Overall WFL for tasks assessed    Lower Extremity Assessment Lower Extremity Assessment: RLE deficits/detail;Overall WFL for tasks assessed RLE Deficits / Details: R ankle ROM limited due to previous ankle fx       Communication   Communication: No difficulties  Cognition Arousal/Alertness: Awake/alert Behavior During Therapy: WFL for tasks assessed/performed Overall Cognitive Status: Within Functional Limits for tasks assessed                                        General Comments General comments (skin integrity, edema, etc.): VSS throughout session         Assessment/Plan    PT Assessment All further PT needs can be met in the next venue of care  PT Problem List Decreased range  of motion;Decreased activity tolerance;Decreased strength;Decreased safety awareness       PT Treatment Interventions      PT Goals (Current goals can be found in the Care Plan section)  Acute Rehab PT Goals Patient Stated Goal: go home  PT Goal Formulation: All assessment and education complete, DC therapy Time For Goal Achievement: 01/30/17 Potential to Achieve Goals: Good     AM-PAC PT "6 Clicks" Daily Activity  Outcome Measure Difficulty turning over in bed (including adjusting bedclothes, sheets and blankets)?: None Difficulty moving from lying on back to sitting on the side of the bed? :  None Difficulty sitting down on and standing up from a chair with arms (e.g., wheelchair, bedside commode, etc,.)?: None Help needed moving to and from a bed to chair (including a wheelchair)?: None Help needed walking in hospital room?: None Help needed climbing 3-5 steps with a railing? : A Little 6 Click Score: 23    End of Session Equipment Utilized During Treatment: Gait belt Activity Tolerance: Patient tolerated treatment well Patient left: in bed;with call bell/phone within reach (sitting EOB) Nurse Communication: Mobility status PT Visit Diagnosis: Other abnormalities of gait and mobility (R26.89)    Time: 0479-9872 PT Time Calculation (min) (ACUTE ONLY): 25 min   Charges:   PT Evaluation $PT Eval Moderate Complexity: 1 Mod PT Treatments $Gait Training: 8-22 mins   PT G Codes:        Ashling Roane B. Migdalia Dk PT, DPT Acute Rehabilitation  [phone] Pager [phone]    [place] 01/30/2017, 10:00 AM

## 2017-01-30 NOTE — Care Management Note (Signed)
Case Management Note  Patient Details  Name: Isabella BowensRaymond Walter Kibler MRN: [id number] Date of Birth: 07/27/1940  Subjective/Objective:     DVT               Action/Plan: Discharge Planning: NCM spoke to pt and dtr at bedside. Provided pt with Eliquis 30 day free trial card. His copay is $28 and pharmacy do have in stock. Pt has RW, bedside commode and CPAP at home. [place] and they provided PT.   PCP Annita BrodASENSO, PHILIP   Expected Discharge Date:  01/30/17               Expected Discharge Plan:  Home/Self Care  In-House Referral:  NA  Discharge planning Services  CM Consult, Medication Assistance  Post Acute Care Choice:  NA Choice offered to:  NA  DME Arranged:  N/A DME Agency:  NA  HH Arranged:  NA HH Agency:  NA  Status of Service:  Completed, signed off  If discussed at Long Length of Stay Meetings, dates discussed:    Additional Comments:  Elliot CousinShavis, Kizer Nobbe Ellen, RN 01/30/2017, 10:33 AM

## 2017-01-30 NOTE — Discharge Instructions (Signed)
Information on my medicine - ELIQUIS (apixaban)  This medication education was reviewed with me or my healthcare representative as part of my discharge preparation.    Why was Eliquis prescribed for you? Eliquis was prescribed to treat blood clots that may have been found in the veins of your legs (deep vein thrombosis) or in your lungs (pulmonary embolism) and to reduce the risk of them occurring again.  What do You need to know about Eliquis ? The starting dose is 10 mg (two 5 mg tablets) taken TWICE daily for the FIRST SEVEN (7) DAYS, then on 02-05-17  the dose is reduced to ONE 5 mg tablet taken TWICE daily.  Eliquis may be taken with or without food.   Try to take the dose about the same time in the morning and in the evening. If you have difficulty swallowing the tablet whole please discuss with your pharmacist how to take the medication safely.  Take Eliquis exactly as prescribed and DO NOT stop taking Eliquis without talking to the doctor who prescribed the medication.  Stopping may increase your risk of developing a new blood clot.  Refill your prescription before you run out.  After discharge, you should have regular check-up appointments with your healthcare provider that is prescribing your Eliquis.    What do you do if you miss a dose? If a dose of ELIQUIS is not taken at the scheduled time, take it as soon as possible on the same day and twice-daily administration should be resumed. The dose should not be doubled to make up for a missed dose.  Important Safety Information A possible side effect of Eliquis is bleeding. You should call your healthcare provider right away if you experience any of the following: ? Bleeding from an injury or your nose that does not stop. ? Unusual colored urine (red or dark brown) or unusual colored stools (red or black). ? Unusual bruising for unknown reasons. ? A serious fall or if you hit your head (even if there is no bleeding).  Some  medicines may interact with Eliquis and might increase your risk of bleeding or clotting while on Eliquis. To help avoid this, consult your healthcare provider or pharmacist prior to using any new prescription or non-prescription medications, including herbals, vitamins, non-steroidal anti-inflammatory drugs (NSAIDs) and supplements.  This website has more information on Eliquis (apixaban): http://www.eliquis.com/eliquis/home

## 2017-01-31 LAB — CULTURE, BLOOD (ROUTINE X 2)
CULTURE: NO GROWTH
Culture: NO GROWTH
Special Requests: ADEQUATE

## 2017-02-05 NOTE — Progress Notes (Signed)
Cardiology Office Note:    Date:  02/06/2017   ID:  Joshua Dalton, DOB 11/24/1940, MRN [id number]  PCP:  Annita BrodAsenso, Philip, MD  Cardiologist:  Norman HerrlichBrian Arsen Mangione, MD    Referring MD: Salomon FickMitra, Shyamal K, MD    ASSESSMENT:    1. Acute deep vein thrombosis (DVT) of other vein of lower extremity, unspecified laterality (HCC)   2. Chronic diastolic CHF (congestive heart failure) (HCC)   3. Essential hypertension   4. Mobitz type 1 second degree atrioventricular block   5. Chronic kidney disease (CKD), stage III (moderate) (HCC)   6. Chronic respiratory failure with hypoxia (HCC)    PLAN:    In order of problems listed above:  1. 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 2. Stable compensated no volume overload continue his current diuretic sodium restriction daily weights and self-management 3. Stable blood pressure target continue current treatment 4. Stable during recent hospitalization and monitoring, he does not require pacemaker but will avoid rate lowering medications. 5. Stable recheck renal function today along with BNP level 6. Stable continue his home nocturnal oxygen and CPAP   Next appointment: 3 months   Medication Adjustments/Labs and Tests Ordered: Current medicines are reviewed at length with the patient today.  Concerns regarding medicines are outlined above.  No orders of the defined types were placed in this encounter.  No orders of the defined types were placed in this encounter.   Chief Complaint  Patient presents with  . Follow-up    History of Present Illness:    Joshua Dalton is a 76 y.o. male with a hx of heart failure, hypertension with CKD, sleep apnea and bradycardia last seen 2 months ago. Recent hospitalization with VTE Admit date: 01/26/2017 Discharge date: 01/30/2017 Recommendations for Outpatient Follow-up:  1. Follow up with PCP in 1week with repeat CBC/BMP 2. Follow-up with  cardiology as scheduled 3. Follow-up in the ED if symptoms worsen or new appear Discharge Condition: Stable  CODE STATUS: Full  Diet recommendation: Heart Healthy / Carb Modified/fluid restriction up to 1500 ML per day Brief/Interim Summary: 76 year old male with history of hypertension, hyperlipidemia, obstructive sleep apnea, chronic diastolic CHF, chronic kidney disease stage III, history of DVT recently taken off Pradaxa presented with shortness of breath and cough. Patient was admitted with acute extensive right lower extremity DVT along with suspected PE and started on heparin drip. He has been switched to oral Eliquis. Is stable for discharge. Discharge Diagnoses:  Principal Problem:   Acute DVT (deep venous thrombosis) (HCC) Active Problems:   DM2 (diabetes mellitus, type 2) (HCC)   HTN (hypertension)   Chronic kidney disease (CKD), stage III (moderate) (HCC)   SOB (shortness of breath)   Chronic diastolic CHF (congestive heart failure) (HCC)   Pulmonary embolism (HCC) 1. Probable acute pulmonary embolism and acute RLE DVT - VQ scan shows intermediate probability of PE in the right middle lobe. Status post heparin drip. Started Eliquistoday on 01/29/2017; continue Eliquis 10 mg by mouth twice a day till 02/04/2017 then switched to 5 mg by mouth twice a day from 02/05/2017 onwards.  Echo shows ejection fraction of 60-65% with grade 1 diastolic dysfunction - Patient was on Pradaxa till almost 10 days prior to admission - Discharge patient home today with outpatient follow-up with primary care provider with repeat CBC/BMP in a week 2. Hypertension  - Monitor blood pressure. Continue lisinopril 3.Insulin-dependent DM uncontrolled with hyperglycemia and hypoglycemia - Outpatient follow-up with primary  care provider. Decrease Lantus to 45 units at bedtime and NovoLog to 15 units 3 times a day with meals 5. Chronic diastolic CHF -Currently compensated. Continue Lasix and  lisinopril. Outpatient follow-up with cardiology 6. Hypokalemia  - Improved 7. CKD stage III-IV - Monitor creatinine. Currently stable. Outpatient follow-up 8. Leukocytosis - Probably reactive. Resolved 9. Nocturnal bradycardia - Cardiology evaluation appreciated. Avoid beta blockers or calcium channel blockers. Monitor; no further intervention. Outpatient follow-up   Compliance with diet, lifestyle and medications: Yes He is back to his baseline no calf pain usual shortness of breath with greater than mild activity and has no edema orthopnea PND palpitations syncope or TIA. Past Medical History:  Diagnosis Date  . Asthma   . Chronic diastolic (congestive) heart failure (HCC)    a. echo in 08/2016 showed a preserved EF of 60-65%, Grade 1 DD, and no WMA. Mild AI noted.   . Chronic kidney disease, stage III (moderate) (HCC)   . Diabetes mellitus without complication (HCC)   . Heart disease   . Hypercholesteremia   . Hypertension   . Obstructive sleep apnea   . Personal history of venous thrombosis and embolism     Past Surgical History:  Procedure Laterality Date  . CARDIAC SURGERY      Current Medications: Current Meds  Medication Sig  . allopurinol (ZYLOPRIM) 300 MG tablet Take 300 mg by mouth daily.  Marland Kitchen apixaban (ELIQUIS) 5 MG TABS tablet 10mg  BID till 02/04/17 then 5mg  BID from 02/05/17 onwards  . fenofibrate (TRICOR) 145 MG tablet Take 145 mg by mouth daily.  . fexofenadine (ALLEGRA) 180 MG tablet Take 180 mg by mouth daily.  . finasteride (PROSCAR) 5 MG tablet Take 5 mg by mouth daily.  Marland Kitchen FLUoxetine (PROZAC) 20 MG capsule Take 20 mg by mouth daily.  . furosemide (LASIX) 40 MG tablet Take 40 mg 2 (two) times daily by mouth. Take 40 mg in am and 20 mg in evening  . insulin aspart (NOVOLOG) 100 UNIT/ML injection Inject 10 Units into the skin 3 (three) times daily before meals.  . insulin glargine (LANTUS) 100 UNIT/ML injection Inject 0.45 mLs (45 Units total) into the skin  at bedtime.  Marland Kitchen oxybutynin (DITROPAN-XL) 10 MG 24 hr tablet Take 10 mg by mouth at bedtime.  . pantoprazole (PROTONIX) 40 MG tablet Take 1 tablet (40 mg total) by mouth daily.  . pravastatin (PRAVACHOL) 80 MG tablet Take 80 mg by mouth daily.  . tamsulosin (FLOMAX) 0.4 MG CAPS capsule Take 0.4 mg by mouth every morning.   . [DISCONTINUED] furosemide (LASIX) 40 MG tablet TAKE 1 TABLET BY MOUTH IN THE MORNING AND 1/2 IN THE EVENING (Patient taking differently: Take 40 mg 2 (two) times daily by mouth. TAKE 1 TABLET BY MOUTH IN THE MORNING AND 1/2 IN THE EVENING)     Allergies:   Patient has no known allergies.   Social History   Socioeconomic History  . Marital status: Widowed    Spouse name: None  . Number of children: None  . Years of education: None  . Highest education level: None  Social Needs  . Financial resource strain: None  . Food insecurity - worry: None  . Food insecurity - inability: None  . Transportation needs - medical: None  . Transportation needs - non-medical: None  Occupational History  . None  Tobacco Use  . Smoking status: Former Smoker    Last attempt to quit: 1959    Years since quitting: 59.8  .  Smokeless tobacco: Never Used  Substance and Sexual Activity  . Alcohol use: No  . Drug use: None  . Sexual activity: None  Other Topics Concern  . None  Social History Narrative  . None     Family History: The patient's family history includes Heart attack in his father; Heart disease in his mother; Hypertension in his mother. ROS:   Please see the history of present illness.    All other systems reviewed and are negative.  EKGs/Labs/Other Studies Reviewed:    The following studies were reviewed today:  Event monitor reviewed by Dr. Johney Frame "sinus rhythm with first degree AV block. There is rare nocturnal mobitz I second degree AV block. I do not see mobitz II or more advanced AV block. No prolonged pauses or symptomatic bradycardias"   Echo:  01/27/17 Study Conclusions - Left ventricle: The cavity size was normal. Wall thickness was   normal. Systolic function was normal. The estimated ejection   fraction was in the range of 60% to 65%. Doppler parameters are   consistent with abnormal left ventricular relaxation (grade 1   diastolic dysfunction). - Aortic valve: There was trivial regurgitation. - Right ventricle: The cavity size was mildly dilated. Wall   thickness was normal. - Impressions: Very limited images due to poor sound wave   transmission.  EKG: 01/29/17: SRTH Mobitz 1 AVB RBBB  Recent Labs: 08/21/2016: TSH 3.016 01/26/2017: ALT 12; B Natriuretic Peptide 33.0 01/29/2017: Hemoglobin 14.4; Platelets 248 01/30/2017: BUN 38; Creatinine, Ser 2.57; Magnesium 2.1; Potassium 4.3; Sodium 139  Recent Lipid Panel No results found for: CHOL, TRIG, HDL, CHOLHDL, VLDL, LDLCALC, LDLDIRECT  Physical Exam:    VS:  BP 120/60 (BP Location: Right Arm, Patient Position: Sitting, Cuff Size: Normal)   Pulse 97   Ht 5\' 6"  (1.676 m)   Wt 235 lb (106.6 kg)   SpO2 94%   BMI 37.93 kg/m     Wt Readings from Last 3 Encounters:  02/06/17 235 lb (106.6 kg)  01/30/17 235 lb 3.2 oz (106.7 kg)  01/09/17 241 lb (109.3 kg)     GEN:  Well nourished, well developed in no acute distress HEENT: Normal NECK: No JVD; No carotid bruits LYMPHATICS: No lymphadenopathy CARDIAC:  RRR, no murmurs, rubs, gallops RESPIRATORY:  Clear to auscultation without rales, wheezing or rhonchi  ABDOMEN: Soft, non-tender, non-distended MUSCULOSKELETAL:  No edema; No deformity  SKIN: Warm and dry NEUROLOGIC:  Alert and oriented x 3 PSYCHIATRIC:  Normal affect    Signed, Norman Herrlich, MD  02/06/2017 11:00 AM    [facility]

## 2017-02-06 ENCOUNTER — Encounter: Payer: Self-pay | Admitting: Cardiology

## 2017-02-06 ENCOUNTER — Ambulatory Visit (INDEPENDENT_AMBULATORY_CARE_PROVIDER_SITE_OTHER): Payer: Medicare Other | Admitting: Cardiology

## 2017-02-06 ENCOUNTER — Other Ambulatory Visit: Payer: Self-pay | Admitting: Cardiology

## 2017-02-06 VITALS — BP 120/60 | HR 97 | Ht 66.0 in | Wt 235.0 lb

## 2017-02-06 DIAGNOSIS — J9611 Chronic respiratory failure with hypoxia: Secondary | ICD-10-CM | POA: Diagnosis not present

## 2017-02-06 DIAGNOSIS — I10 Essential (primary) hypertension: Secondary | ICD-10-CM

## 2017-02-06 DIAGNOSIS — N183 Chronic kidney disease, stage 3 unspecified: Secondary | ICD-10-CM

## 2017-02-06 DIAGNOSIS — I82409 Acute embolism and thrombosis of unspecified deep veins of unspecified lower extremity: Secondary | ICD-10-CM

## 2017-02-06 DIAGNOSIS — I441 Atrioventricular block, second degree: Secondary | ICD-10-CM

## 2017-02-06 DIAGNOSIS — I5032 Chronic diastolic (congestive) heart failure: Secondary | ICD-10-CM | POA: Diagnosis not present

## 2017-02-06 NOTE — Patient Instructions (Addendum)
Medication Instructions:  Your physician recommends that you continue on your current medications as directed. Please refer to the Current Medication list given to you today.  Labwork: Your physician recommends that you return for lab work today. BNP, BMP  Testing/Procedures: None  Follow-Up: Your physician wants you to follow-up in: 3 months. You will receive a reminder letter in the mail two months in advance. If you don't receive a letter, please call our office to schedule the follow-up appointment.  Any Other Special Instructions Will Be Listed Below (If Applicable).     If you need a refill on your cardiac medications before your next appointment, please call your pharmacy.    Heart Failure  Weigh yourself every morning when you first wake up and record on a calender or note pad, bring this to your office visits. Using a pill tender can help with taking your medications consistently.  Limit your fluid intake to 2 liters daily  Limit your sodium intake to less than 2-3 grams daily. Ask if you need dietary teaching.  If you gain more than 3 pounds (from your dry weight ), double your dose of diuretic for the day.  If you gain more than 5 pounds (from your dry weight), double your dose of lasix and call your heart failure doctor.  Please do not smoke tobacco since it is very bad for your heart.  Please do not drink alcohol since it can worsen your heart failure.Also avoid OTC nonsteroidal drugs, such as advil, aleve and motrin.  Try to exercise for at least 30 minutes every day because this will help your heart be more efficient. You may be eligible for supervised cardiac rehab, ask your physician.

## 2017-02-07 LAB — BASIC METABOLIC PANEL
BUN / CREAT RATIO: 12 (ref 10–24)
BUN: 25 mg/dL (ref 8–27)
CO2: 20 mmol/L (ref 20–29)
Calcium: 9.6 mg/dL (ref 8.6–10.2)
Chloride: 104 mmol/L (ref 96–106)
Creatinine, Ser: 2.17 mg/dL — ABNORMAL HIGH (ref 0.76–1.27)
GFR, EST AFRICAN AMERICAN: 33 mL/min/{1.73_m2} — AB (ref >59)
GFR, EST NON AFRICAN AMERICAN: 28 mL/min/{1.73_m2} — AB (ref >59)
Glucose: 239 mg/dL — ABNORMAL HIGH (ref 65–99)
POTASSIUM: 4.1 mmol/L (ref 3.5–5.2)
SODIUM: 141 mmol/L (ref 134–144)

## 2017-02-07 LAB — PRO B NATRIURETIC PEPTIDE: NT-Pro BNP: 146 pg/mL (ref 0–486)

## 2017-03-26 ENCOUNTER — Other Ambulatory Visit: Payer: Self-pay | Admitting: Internal Medicine

## 2017-06-09 ENCOUNTER — Other Ambulatory Visit: Payer: Self-pay | Admitting: Student

## 2017-06-11 ENCOUNTER — Encounter (HOSPITAL_COMMUNITY): Payer: Self-pay | Admitting: Emergency Medicine

## 2017-06-11 ENCOUNTER — Emergency Department (HOSPITAL_COMMUNITY): Payer: Medicare Other

## 2017-06-11 ENCOUNTER — Inpatient Hospital Stay (HOSPITAL_COMMUNITY)
Admission: EM | Admit: 2017-06-11 | Discharge: 2017-06-14 | DRG: 418 | Disposition: A | Discharge status: Home / Self Care | Payer: Medicare Other | Attending: Oncology | Admitting: Oncology

## 2017-06-11 ENCOUNTER — Inpatient Hospital Stay (HOSPITAL_COMMUNITY): Payer: Medicare Other

## 2017-06-11 ENCOUNTER — Other Ambulatory Visit: Payer: Self-pay

## 2017-06-11 DIAGNOSIS — K5790 Diverticulosis of intestine, part unspecified, without perforation or abscess without bleeding: Secondary | ICD-10-CM | POA: Diagnosis present

## 2017-06-11 DIAGNOSIS — J45909 Unspecified asthma, uncomplicated: Secondary | ICD-10-CM | POA: Diagnosis present

## 2017-06-11 DIAGNOSIS — E1122 Type 2 diabetes mellitus with diabetic chronic kidney disease: Secondary | ICD-10-CM | POA: Diagnosis present

## 2017-06-11 DIAGNOSIS — K8001 Calculus of gallbladder with acute cholecystitis with obstruction: Secondary | ICD-10-CM | POA: Diagnosis present

## 2017-06-11 DIAGNOSIS — Z79899 Other long term (current) drug therapy: Secondary | ICD-10-CM | POA: Diagnosis not present

## 2017-06-11 DIAGNOSIS — M109 Gout, unspecified: Secondary | ICD-10-CM | POA: Diagnosis present

## 2017-06-11 DIAGNOSIS — I130 Hypertensive heart and chronic kidney disease with heart failure and stage 1 through stage 4 chronic kidney disease, or unspecified chronic kidney disease: Secondary | ICD-10-CM | POA: Diagnosis present

## 2017-06-11 DIAGNOSIS — G4733 Obstructive sleep apnea (adult) (pediatric): Secondary | ICD-10-CM | POA: Diagnosis present

## 2017-06-11 DIAGNOSIS — E785 Hyperlipidemia, unspecified: Secondary | ICD-10-CM | POA: Diagnosis present

## 2017-06-11 DIAGNOSIS — R001 Bradycardia, unspecified: Secondary | ICD-10-CM | POA: Diagnosis present

## 2017-06-11 DIAGNOSIS — I5030 Unspecified diastolic (congestive) heart failure: Secondary | ICD-10-CM

## 2017-06-11 DIAGNOSIS — Z86711 Personal history of pulmonary embolism: Secondary | ICD-10-CM | POA: Diagnosis not present

## 2017-06-11 DIAGNOSIS — I440 Atrioventricular block, first degree: Secondary | ICD-10-CM

## 2017-06-11 DIAGNOSIS — Z96651 Presence of right artificial knee joint: Secondary | ICD-10-CM | POA: Diagnosis present

## 2017-06-11 DIAGNOSIS — Z794 Long term (current) use of insulin: Secondary | ICD-10-CM

## 2017-06-11 DIAGNOSIS — Z9981 Dependence on supplemental oxygen: Secondary | ICD-10-CM | POA: Diagnosis not present

## 2017-06-11 DIAGNOSIS — I441 Atrioventricular block, second degree: Secondary | ICD-10-CM | POA: Diagnosis present

## 2017-06-11 DIAGNOSIS — I5032 Chronic diastolic (congestive) heart failure: Secondary | ICD-10-CM | POA: Diagnosis present

## 2017-06-11 DIAGNOSIS — Z01810 Encounter for preprocedural cardiovascular examination: Secondary | ICD-10-CM | POA: Diagnosis not present

## 2017-06-11 DIAGNOSIS — R109 Unspecified abdominal pain: Secondary | ICD-10-CM | POA: Diagnosis present

## 2017-06-11 DIAGNOSIS — Z6836 Body mass index (BMI) 36.0-36.9, adult: Secondary | ICD-10-CM

## 2017-06-11 DIAGNOSIS — Z87891 Personal history of nicotine dependence: Secondary | ICD-10-CM | POA: Diagnosis not present

## 2017-06-11 DIAGNOSIS — I252 Old myocardial infarction: Secondary | ICD-10-CM

## 2017-06-11 DIAGNOSIS — Z87442 Personal history of urinary calculi: Secondary | ICD-10-CM

## 2017-06-11 DIAGNOSIS — E876 Hypokalemia: Secondary | ICD-10-CM | POA: Diagnosis not present

## 2017-06-11 DIAGNOSIS — Z9989 Dependence on other enabling machines and devices: Secondary | ICD-10-CM | POA: Diagnosis not present

## 2017-06-11 DIAGNOSIS — Z419 Encounter for procedure for purposes other than remedying health state, unspecified: Secondary | ICD-10-CM

## 2017-06-11 DIAGNOSIS — K810 Acute cholecystitis: Secondary | ICD-10-CM | POA: Diagnosis present

## 2017-06-11 DIAGNOSIS — N400 Enlarged prostate without lower urinary tract symptoms: Secondary | ICD-10-CM | POA: Diagnosis present

## 2017-06-11 DIAGNOSIS — Z86718 Personal history of other venous thrombosis and embolism: Secondary | ICD-10-CM | POA: Diagnosis not present

## 2017-06-11 DIAGNOSIS — Z8249 Family history of ischemic heart disease and other diseases of the circulatory system: Secondary | ICD-10-CM

## 2017-06-11 DIAGNOSIS — E669 Obesity, unspecified: Secondary | ICD-10-CM | POA: Diagnosis present

## 2017-06-11 DIAGNOSIS — N183 Chronic kidney disease, stage 3 unspecified: Secondary | ICD-10-CM

## 2017-06-11 DIAGNOSIS — I2510 Atherosclerotic heart disease of native coronary artery without angina pectoris: Secondary | ICD-10-CM | POA: Diagnosis present

## 2017-06-11 DIAGNOSIS — I872 Venous insufficiency (chronic) (peripheral): Secondary | ICD-10-CM | POA: Diagnosis not present

## 2017-06-11 DIAGNOSIS — Z9049 Acquired absence of other specified parts of digestive tract: Secondary | ICD-10-CM | POA: Diagnosis not present

## 2017-06-11 DIAGNOSIS — I10 Essential (primary) hypertension: Secondary | ICD-10-CM

## 2017-06-11 DIAGNOSIS — Z955 Presence of coronary angioplasty implant and graft: Secondary | ICD-10-CM

## 2017-06-11 DIAGNOSIS — R0989 Other specified symptoms and signs involving the circulatory and respiratory systems: Secondary | ICD-10-CM | POA: Diagnosis not present

## 2017-06-11 DIAGNOSIS — Z7901 Long term (current) use of anticoagulants: Secondary | ICD-10-CM

## 2017-06-11 DIAGNOSIS — E119 Type 2 diabetes mellitus without complications: Secondary | ICD-10-CM

## 2017-06-11 DIAGNOSIS — E662 Morbid (severe) obesity with alveolar hypoventilation: Secondary | ICD-10-CM | POA: Diagnosis not present

## 2017-06-11 HISTORY — DX: Major depressive disorder, single episode, unspecified: F32.9

## 2017-06-11 HISTORY — DX: Dependence on other enabling machines and devices: Z99.89

## 2017-06-11 HISTORY — DX: Acute embolism and thrombosis of unspecified deep veins of unspecified lower extremity: I82.409

## 2017-06-11 HISTORY — DX: Personal history of urinary calculi: Z87.442

## 2017-06-11 HISTORY — DX: Obstructive sleep apnea (adult) (pediatric): G47.33

## 2017-06-11 HISTORY — DX: Dependence on supplemental oxygen: Z99.81

## 2017-06-11 HISTORY — DX: Gout, unspecified: M10.9

## 2017-06-11 HISTORY — DX: Depression, unspecified: F32.A

## 2017-06-11 HISTORY — DX: Gastro-esophageal reflux disease without esophagitis: K21.9

## 2017-06-11 HISTORY — DX: Type 2 diabetes mellitus without complications: E11.9

## 2017-06-11 HISTORY — DX: Atherosclerotic heart disease of native coronary artery without angina pectoris: I25.10

## 2017-06-11 HISTORY — DX: Acute myocardial infarction, unspecified: I21.9

## 2017-06-11 HISTORY — DX: Anxiety disorder, unspecified: F41.9

## 2017-06-11 HISTORY — DX: Unspecified osteoarthritis, unspecified site: M19.90

## 2017-06-11 LAB — CBC
HCT: 48.1 % (ref 39.0–52.0)
Hemoglobin: 16.5 g/dL (ref 13.0–17.0)
MCH: 32.7 pg (ref 26.0–34.0)
MCHC: 34.3 g/dL (ref 30.0–36.0)
MCV: 95.4 fL (ref 78.0–100.0)
PLATELETS: 273 10*3/uL (ref 150–400)
RBC: 5.04 MIL/uL (ref 4.22–5.81)
RDW: 14.9 % (ref 11.5–15.5)
WBC: 11.4 10*3/uL — ABNORMAL HIGH (ref 4.0–10.5)

## 2017-06-11 LAB — COMPREHENSIVE METABOLIC PANEL
ALT: 19 U/L (ref 17–63)
AST: 32 U/L (ref 15–41)
Albumin: 3.6 g/dL (ref 3.5–5.0)
Alkaline Phosphatase: 91 U/L (ref 38–126)
Anion gap: 14 (ref 5–15)
BUN: 20 mg/dL (ref 6–20)
CO2: 19 mmol/L — AB (ref 22–32)
CREATININE: 2.02 mg/dL — AB (ref 0.61–1.24)
Calcium: 9.6 mg/dL (ref 8.9–10.3)
Chloride: 103 mmol/L (ref 101–111)
GFR calc Af Amer: 35 mL/min — ABNORMAL LOW (ref >60)
GFR, EST NON AFRICAN AMERICAN: 30 mL/min — AB (ref >60)
GLUCOSE: 324 mg/dL — AB (ref 65–99)
Potassium: 4 mmol/L (ref 3.5–5.1)
Sodium: 136 mmol/L (ref 135–145)
Total Bilirubin: 1.3 mg/dL — ABNORMAL HIGH (ref 0.3–1.2)
Total Protein: 6.9 g/dL (ref 6.5–8.1)

## 2017-06-11 LAB — URINALYSIS, ROUTINE W REFLEX MICROSCOPIC
Bacteria, UA: NONE SEEN
Bilirubin Urine: NEGATIVE
Ketones, ur: NEGATIVE mg/dL
Leukocytes, UA: NEGATIVE
Nitrite: NEGATIVE
Protein, ur: 100 mg/dL — AB
SPECIFIC GRAVITY, URINE: 1.014 (ref 1.005–1.030)
pH: 8 (ref 5.0–8.0)

## 2017-06-11 LAB — I-STAT CHEM 8, ED
BUN: 23 mg/dL — ABNORMAL HIGH (ref 6–20)
CHLORIDE: 103 mmol/L (ref 101–111)
Calcium, Ion: 1.08 mmol/L — ABNORMAL LOW (ref 1.15–1.40)
Creatinine, Ser: 1.8 mg/dL — ABNORMAL HIGH (ref 0.61–1.24)
GLUCOSE: 318 mg/dL — AB (ref 65–99)
HCT: 50 % (ref 39.0–52.0)
Hemoglobin: 17 g/dL (ref 13.0–17.0)
POTASSIUM: 3.6 mmol/L (ref 3.5–5.1)
Sodium: 139 mmol/L (ref 135–145)
TCO2: 22 mmol/L (ref 22–32)

## 2017-06-11 LAB — HEPARIN LEVEL (UNFRACTIONATED)

## 2017-06-11 LAB — GLUCOSE, CAPILLARY
GLUCOSE-CAPILLARY: 259 mg/dL — AB (ref 65–99)
Glucose-Capillary: 240 mg/dL — ABNORMAL HIGH (ref 65–99)
Glucose-Capillary: 227 mg/dL — ABNORMAL HIGH (ref 65–99)

## 2017-06-11 LAB — LIPASE, BLOOD: LIPASE: 27 U/L (ref 11–51)

## 2017-06-11 LAB — APTT: aPTT: 28 seconds (ref 24–36)

## 2017-06-11 MED ORDER — PIPERACILLIN-TAZOBACTAM 3.375 G IVPB 30 MIN
3.3750 g | Freq: Once | INTRAVENOUS | Status: AC
  Administered 2017-06-11: 3.375 g via INTRAVENOUS
  Filled 2017-06-11: qty 50

## 2017-06-11 MED ORDER — ONDANSETRON HCL 4 MG/2ML IJ SOLN
4.0000 mg | Freq: Once | INTRAMUSCULAR | Status: AC
  Administered 2017-06-11: 4 mg via INTRAVENOUS
  Filled 2017-06-11: qty 2

## 2017-06-11 MED ORDER — INSULIN ASPART 100 UNIT/ML ~~LOC~~ SOLN
6.0000 [IU] | Freq: Three times a day (TID) | SUBCUTANEOUS | Status: DC
  Administered 2017-06-11: 6 [IU] via SUBCUTANEOUS
  Administered 2017-06-12: 3 [IU] via SUBCUTANEOUS
  Administered 2017-06-13 – 2017-06-14 (×3): 6 [IU] via SUBCUTANEOUS

## 2017-06-11 MED ORDER — INSULIN GLARGINE 100 UNIT/ML ~~LOC~~ SOLN
30.0000 [IU] | Freq: Every day | SUBCUTANEOUS | Status: DC
  Administered 2017-06-11: 30 [IU] via SUBCUTANEOUS
  Filled 2017-06-11 (×2): qty 0.3

## 2017-06-11 MED ORDER — MORPHINE SULFATE (PF) 4 MG/ML IV SOLN
4.0000 mg | Freq: Once | INTRAVENOUS | Status: AC
  Administered 2017-06-11: 4 mg via INTRAVENOUS
  Filled 2017-06-11: qty 1

## 2017-06-11 MED ORDER — TAMSULOSIN HCL 0.4 MG PO CAPS
0.4000 mg | ORAL_CAPSULE | Freq: Every morning | ORAL | Status: DC
  Administered 2017-06-11 – 2017-06-14 (×3): 0.4 mg via ORAL
  Filled 2017-06-11 (×3): qty 1

## 2017-06-11 MED ORDER — ONDANSETRON HCL 4 MG PO TABS: 4.0000 mg | ORAL_TABLET | Freq: Four times a day (QID) | ORAL | Status: DC | PRN

## 2017-06-11 MED ORDER — PANTOPRAZOLE SODIUM 40 MG PO TBEC
40.0000 mg | DELAYED_RELEASE_TABLET | Freq: Every day | ORAL | Status: DC
  Administered 2017-06-11 – 2017-06-14 (×3): 40 mg via ORAL
  Filled 2017-06-11 (×3): qty 1

## 2017-06-11 MED ORDER — APIXABAN 5 MG PO TABS
5.0000 mg | ORAL_TABLET | Freq: Two times a day (BID) | ORAL | Status: DC
  Administered 2017-06-11: 5 mg via ORAL
  Filled 2017-06-11: qty 1

## 2017-06-11 MED ORDER — FENOFIBRATE 160 MG PO TABS
160.0000 mg | ORAL_TABLET | Freq: Every day | ORAL | Status: DC
  Administered 2017-06-11 – 2017-06-14 (×3): 160 mg via ORAL
  Filled 2017-06-11 (×4): qty 1

## 2017-06-11 MED ORDER — FLUOXETINE HCL 20 MG PO CAPS
20.0000 mg | ORAL_CAPSULE | Freq: Every day | ORAL | Status: DC
  Administered 2017-06-11 – 2017-06-14 (×3): 20 mg via ORAL
  Filled 2017-06-11 (×3): qty 1

## 2017-06-11 MED ORDER — PRAVASTATIN SODIUM 40 MG PO TABS
80.0000 mg | ORAL_TABLET | Freq: Every day | ORAL | Status: DC
  Administered 2017-06-11 – 2017-06-13 (×3): 80 mg via ORAL
  Filled 2017-06-11 (×3): qty 2

## 2017-06-11 MED ORDER — ALLOPURINOL 300 MG PO TABS
300.0000 mg | ORAL_TABLET | Freq: Every day | ORAL | Status: DC
  Administered 2017-06-11 – 2017-06-14 (×3): 300 mg via ORAL
  Filled 2017-06-11 (×3): qty 1

## 2017-06-11 MED ORDER — MORPHINE SULFATE (PF) 4 MG/ML IV SOLN: 2.0000 mg | INTRAVENOUS | Status: DC | PRN

## 2017-06-11 MED ORDER — SODIUM CHLORIDE 0.9 % IV SOLN: 250.0000 mL | INTRAVENOUS | Status: DC | PRN

## 2017-06-11 MED ORDER — OXYBUTYNIN CHLORIDE ER 10 MG PO TB24
10.0000 mg | ORAL_TABLET | Freq: Every day | ORAL | Status: DC
  Administered 2017-06-11 – 2017-06-13 (×3): 10 mg via ORAL
  Filled 2017-06-11 (×3): qty 1

## 2017-06-11 MED ORDER — SODIUM CHLORIDE 0.9% FLUSH
3.0000 mL | Freq: Two times a day (BID) | INTRAVENOUS | Status: DC
  Administered 2017-06-13 – 2017-06-14 (×2): 3 mL via INTRAVENOUS

## 2017-06-11 MED ORDER — FUROSEMIDE 40 MG PO TABS
40.0000 mg | ORAL_TABLET | Freq: Two times a day (BID) | ORAL | Status: DC
  Administered 2017-06-11 – 2017-06-14 (×5): 40 mg via ORAL
  Filled 2017-06-11 (×5): qty 1

## 2017-06-11 MED ORDER — PIPERACILLIN-TAZOBACTAM 3.375 G IVPB
3.3750 g | Freq: Three times a day (TID) | INTRAVENOUS | Status: DC
Start: 2017-06-11 — End: 2017-06-14
  Administered 2017-06-11 – 2017-06-14 (×8): 3.375 g via INTRAVENOUS
  Filled 2017-06-11 (×10): qty 50

## 2017-06-11 MED ORDER — FINASTERIDE 5 MG PO TABS
5.0000 mg | ORAL_TABLET | Freq: Every day | ORAL | Status: DC
  Administered 2017-06-11 – 2017-06-14 (×3): 5 mg via ORAL
  Filled 2017-06-11 (×3): qty 1

## 2017-06-11 MED ORDER — HEPARIN (PORCINE) IN NACL 100-0.45 UNIT/ML-% IJ SOLN
1600.0000 [IU]/h | INTRAMUSCULAR | Status: DC
  Administered 2017-06-12: 1400 [IU]/h via INTRAVENOUS
  Administered 2017-06-12: 1350 [IU]/h via INTRAVENOUS
  Filled 2017-06-11 (×4): qty 250

## 2017-06-11 MED ORDER — SODIUM CHLORIDE 0.9% FLUSH: 3.0000 mL | INTRAVENOUS | Status: DC | PRN

## 2017-06-11 MED ORDER — ONDANSETRON HCL 4 MG/2ML IJ SOLN
4.0000 mg | Freq: Four times a day (QID) | INTRAMUSCULAR | Status: DC | PRN
  Administered 2017-06-13: 4 mg via INTRAVENOUS

## 2017-06-11 MED ORDER — INSULIN ASPART 100 UNIT/ML ~~LOC~~ SOLN
0.0000 [IU] | Freq: Three times a day (TID) | SUBCUTANEOUS | Status: DC
  Administered 2017-06-11: 3 [IU] via SUBCUTANEOUS
  Administered 2017-06-11: 5 [IU] via SUBCUTANEOUS
  Administered 2017-06-12 – 2017-06-13 (×2): 2 [IU] via SUBCUTANEOUS
  Administered 2017-06-14: 3 [IU] via SUBCUTANEOUS
  Administered 2017-06-14: 5 [IU] via SUBCUTANEOUS

## 2017-06-11 NOTE — H&P (Addendum)
Date: 06/11/2017               Patient Name:  Joshua Dalton MRN: [id number]  DOB: [birth year] Age / Sex: 77 y.o., male   PCP: Clovia Cuff, MD         Medical Service: Internal Medicine Teaching Service         Attending Physician: Dr. Annia Belt, MD    First Contact: Dr. Maricela Bo Pager: [phone]  Second Contact: Dr. Danford Bad Pager: [phone]       After Hours (After 5p/  First Contact Pager: [phone]  weekends / holidays): Second Contact Pager: [phone]   Chief Complaint: Abdominal pain  History of Present Illness:  Mr. Zuckerman is a 77 year old male with a past medical history significant for hypertension, hyperlipidemia, obstructive sleep apnea, heart failure with preserved ejection fracture, chronic kidney disease stage III, history of DVT who presents to the emergency room with complaints of abdominal pain.  He reports that he was in his normal state of health until approximately 7 PM last night when he developed acute onset right lower quadrant abdominal pain.  Pain does not radiate.  He describes the pain as a crampy pain that has been persistent since onset.  Nothing has made the pain better or worse.  He notes that he has vomited twice since the onset of pain with nonbloody nonbilious emesis.  He reports he is a poor appetite at baseline that is unchanged and the pain is not related to food intake.  He reports normal regular bowel movements daily without any melena or hematochezia.  He reports he had one bowel movement earlier that any change in symptoms.  He denies any history of abdominal surgery.  Does report a history of renal stones though he is unable to tell if this feels similar.  No fevers, chills, chest pain, diarrhea, rashes.   In the ED a CT renal stone study was obtained.  This demonstrated no renal stones or evidence of appendicitis.  However, there was mild soft tissue inflammation suggested about the gallbladder suggestive of possible cholecystitis.   Blood work was remarkable for white count of 11.4 and a mildly elevated bilirubin of 1.3.  AST, ALT, alk phos all normal.  Vital signs all within normal limits with the exception of mildly elevated blood pressure.  Meds:  No outpatient medications have been marked as taking for the 06/11/17 encounter [facility] Encounter).    Allergies: Allergies as of 06/11/2017  . (No Known Allergies)   Past Medical History:  Diagnosis Date  . Asthma   . Chronic diastolic (congestive) heart failure (HCC)    a. echo in 08/2016 showed a preserved EF of 60-65%, Grade 1 DD, and no WMA. Mild AI noted.   . Chronic kidney disease, stage III (moderate) ([place])   . Diabetes mellitus without complication ([place])   . Heart disease   . Hypercholesteremia   . Hypertension   . Obstructive sleep apnea   . Personal history of venous thrombosis and embolism     Family History:  Family History  Problem Relation Age of Onset  . Hypertension Mother   . Heart disease Mother   . Heart attack Father        died younger than pt's current age   Social History:  Social History   Socioeconomic History  . Marital status: Widowed    Spouse name: None  . Number of children: None  . Years of education: None  .  Highest education level: None  Social Needs  . Financial resource strain: None  . Food insecurity - worry: None  . Food insecurity - inability: None  . Transportation needs - medical: None  . Transportation needs - non-medical: None  Occupational History  . None  Tobacco Use  . Smoking status: Former Smoker    Last attempt to quit: 1959    Years since quitting: 60.2  . Smokeless tobacco: Never Used  Substance and Sexual Activity  . Alcohol use: No  . Drug use: None  . Sexual activity: None  Other Topics Concern  . None  Social History Narrative  . None    Review of Systems: A complete ROS was negative except as per HPI.   Physical Exam: Blood pressure (!) 145/68, pulse 80, temperature 98.1 F  (36.7 C), temperature source Oral, resp. rate 18, SpO2 96 %. General: alert, well-developed, and cooperative to examination.  Head: normocephalic and atraumatic.  Eyes: vision grossly intact, pupils equal, pupils round, pupils reactive to light, no injection and anicteric.  Mouth: pharynx pink and moist, no erythema, and no exudates.  Neck: supple, full ROM, no thyromegaly, no JVD, and no carotid bruits.  Lungs: normal respiratory effort, no accessory muscle use, normal breath sounds, no crackles, and no wheezes. Heart: normal rate, regular rhythm, no murmur, no gallop, and no rub.  Abdomen: soft, mild tenderness in right lower quadrant and right upper quadrant to deep palpation, positive Murphy sign, normal bowel sounds, no distention, no guarding, no rebound tenderness. Msk: no joint swelling, no joint warmth, and no redness over joints.  Pulses: 2+ DP/PT pulses bilaterally Extremities: No cyanosis, clubbing, edema Neurologic: alert & oriented X3, no focal deficits Skin: turgor normal and no rashes.  Psych: Normal mood and affect  Assessment & Plan by Problem: 77 year old male with a past medical history of DM, CKD II, HTN, obesity, HLD, HFpEF presenting with acute onset RLQ abdominal pain for the past 12 hours. CT abdomen done in the emergency room with soft tissue inflammation around the gallbladder.   Abdominal pain: With complaints of right lower quadrant abdominal pain with acute onset roughly 12 hours previous.  The pain is been constant and crampy in nature.  There is no pain associated with food.  He has had 2 episodes of nonbilious nonbloody emesis.  Has had normal bowel movements with most recent bowel movement today.  CT abdomen done in the emergency room is notable for soft tissue inflammation about the gallbladder suggestive of possible cholecystitis.  No evidence of common bile duct dilatation.  Mildly elevated bilirubin of 1.3 otherwise no LFT abnormalities.  White count mildly  elevated 11.4.  He is afebrile and without tachycardia or tachypnea.  He was started on Zosyn in the emergency room and surgery was consulted.  On exam, the patient does have tenderness to deep palpitation in the right upper quadrant as well as right lower quadrant.  Additionally, does have a positive Murphy sign.  No other findings on CT to explain patient's symptoms.  No evidence of renal stones, normal appendix, or evidence of diverticulitis the patient does have diverticulosis.   We will obtain right upper quadrant ultrasound to further evaluate his possible acute cholecystitis.  No evidence of cholangitis.  We will continue IV antibiotics with Zosyn.  Will hold his home Eliquis and anticoagulate with heparin subQ pending surgical evaluation.  Clear liquid diet.  General surgery has been consulted and appreciate their assistance on the case.  History of DVTs and PE: Patient with history of DVT and 2016.  He had been on Pradaxa.  This was discontinued in October 2018 patient had subsequent right lower extremity DVT and probable acute PE (VQ scan with intermediate probability) within 2 weeks of stopping the medication.  He has been on Eliquis since that time.  After discussion with Dr. Beryle Beams we will continue his home Eliquis.  Insulin-dependent diabetes mellitus type 2: CBG is in the 300s here today.  Home regimen appears to be Lantus 45 units nightly and NovoLog 10 units q. before meals.  Will place patient on Lantus 35 units nightly and NovoLog 6 units q. before meals with sliding scale insulin.  CKD stage III: Creatinine 2.02 today.  Baseline appears to be around 2.5.  We will continue to monitor.  Hypertension: Blood pressure well controlled.  No antihypertensive medications on his home medication list.  Hyperlipidemia: Patient is on pravastatin 80 mg and fenofibrate 145 mg daily.  Can consider changing pravastatin to atorvastatin or rosuvastatin though this can likely be followed up as  an outpatient.  OSA: CPAP nightly  Heart failure with preserved ejection fracture: 2D Echo done 01/27/2017 with EF 60-65% and grade 1 diastolic dysfunction.  Study limited by poor images.  We will continue his home Lasix dosing of 40 mg every morning and 20 mg nightly.  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  Avoid AV nodal blocking agents.  No beta-blockers or calcium channel blockers.  Diverticulosis: Noted on CT scan today  DVT PPx: Heparin SQ pending surgical evaluation. If no surgery is planned can resume home Eliquis.   Dispo: Admit patient to Inpatient with expected length of stay greater than 2 midnights.  Signed: Maryellen Pile, MD 06/11/2017, 8:26 AM  Pager: [phone]

## 2017-06-11 NOTE — ED Notes (Signed)
Admitting at bedside 

## 2017-06-11 NOTE — ED Triage Notes (Signed)
Patient from [facility] an assisted living facility, having nausea/vomiting and abdominal pain.  He is having pain across his abdomen.  Patient is CAOx4, was given Zofran 4mg  en route to ED.

## 2017-06-11 NOTE — Progress Notes (Signed)
ANTICOAGULATION CONSULT NOTE  Pharmacy Consult for heparin Indication: DVT history  Heparin Dosing Weight: 86.4 kg  Labs: Recent Labs    06/11/17 0434 06/11/17 0441  HGB 16.5 17.0  HCT 48.1 50.0  PLT 273  --   CREATININE 2.02* 1.80*    Assessment: 76 yom with acute cholecystis on apixaban PTA for hx of DVT/PE - no active VTE. Pharmacy consulted to start heparin when apixaban on hold if case of procedures - last dose on 3/11 at 1306. CBC wnl. No bleed documented. Will follow aPTT while apixaban expected to influence heparin levels.  Goal of Therapy:  Heparin level 0.3-0.7 units/ml aPTT 66-102 seconds Monitor platelets by anticoagulation protocol: Yes   Plan:  Baseline aPTT/heparin level No bolus with recent apixaban Start heparin at 1350 units/h on 3/12 at 0100, 12 hours after last apixaban dose 8h aPTT, Daily heparin level/aPTT/CBC Monitor s/sx bleeding, f/u Surgery plans   Babs BertinHaley Jah Alarid, PharmD, BCPS Clinical Pharmacist 06/11/2017 2:27 PM

## 2017-06-11 NOTE — ED Notes (Signed)
To CT

## 2017-06-11 NOTE — ED Provider Notes (Signed)
[facility] EMERGENCY DEPARTMENT Provider Note   CSN: [id number] Arrival date & time: 06/11/17  0428     History   Chief Complaint Chief Complaint  Patient presents with  . Abdominal Pain  . Nausea  . Emesis    HPI Joshua Dalton is a 77 y.o. male.  77 yo M with a chief complaint of right lower quadrant abdominal pain.  This started suddenly about 7 or 8 hours ago.  Patient has been trying to sleep and has been unable to.  He had a bowel movement which made his symptoms mildly better and then they continued.  Started as a colicky pain and then has become more consistent recently.  He has thrown up a couple times and feels like he needs to be cannot throw up anymore.  Denies bloody or bilious emesis.  Denies prior abdominal surgery.  Patient does have a history of kidney stones.  Is unsure if this feels similar.   The history is provided by the patient.  Abdominal Pain   This is a new problem. The current episode started 3 to 5 hours ago. The problem occurs constantly. The problem has not changed since onset.The pain is located in the RLQ. The quality of the pain is sharp and shooting. The pain is at a severity of 9/10. The pain is severe. Associated symptoms include nausea and vomiting. Pertinent negatives include fever, diarrhea, headaches, arthralgias and myalgias. Nothing aggravates the symptoms. Nothing relieves the symptoms.  Emesis   Associated symptoms include abdominal pain. Pertinent negatives include no arthralgias, no chills, no diarrhea, no fever, no headaches and no myalgias.    Past Medical History:  Diagnosis Date  . Asthma   . Chronic diastolic (congestive) heart failure (HCC)    a. echo in 08/2016 showed a preserved EF of 60-65%, Grade 1 DD, and no WMA. Mild AI noted.   . Chronic kidney disease, stage III (moderate) (HCC)   . Diabetes mellitus without complication (HCC)   . Heart disease   . Hypercholesteremia   . Hypertension   .  Obstructive sleep apnea   . Personal history of venous thrombosis and embolism     Patient Active Problem List   Diagnosis Date Noted  . Pulmonary embolism (HCC) 01/27/2017  . SOB (shortness of breath) 01/26/2017  . Acute DVT (deep venous thrombosis) (HCC) 01/26/2017  . Chronic diastolic CHF (congestive heart failure) (HCC) 01/26/2017  . Chronic respiratory failure with hypoxia (HCC) 09/21/2016  . HLD (hyperlipidemia) 09/19/2016  . Obstructive sleep apnea 09/03/2016  . 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 Dysphagia 09/03/2016  . Personal history of DVT (deep vein thrombosis) 09/03/2016  . Dyspnea 08/20/2016  . DM2 (diabetes mellitus, type 2) (HCC) 08/20/2016  . HTN (hypertension) 08/20/2016  . Chronic kidney disease (CKD), stage III (moderate) (HCC) 08/20/2016    Past Surgical History:  Procedure Laterality Date  . CARDIAC SURGERY    . ESOPHAGOGASTRODUODENOSCOPY (EGD) WITH PROPOFOL N/A 08/26/2016   Procedure: ESOPHAGOGASTRODUODENOSCOPY (EGD) WITH PROPOFOL;  Surgeon: Kathi Der, MD;  Location: [facility] ENDOSCOPY;  Service: Gastroenterology;  Laterality: N/A;       Home Medications    Prior to Admission medications   Medication Sig Start Date End Date Taking? Authorizing Provider  allopurinol (ZYLOPRIM) 300 MG tablet Take 300 mg by mouth daily.    [provider]  apixaban (ELIQUIS) 5 MG TABS tablet 10mg  BID till 02/04/17 then 5mg  BID from 02/05/17 onwards 01/30/17  Glade LloydAlekh, Kshitiz, MD  fenofibrate (TRICOR) 145 MG tablet Take 145 mg by mouth daily.    [provider]  fexofenadine (ALLEGRA) 180 MG tablet Take 180 mg by mouth daily.    [provider]  finasteride (PROSCAR) 5 MG tablet Take 5 mg by mouth daily.    [provider]  FLUoxetine (PROZAC) 20 MG capsule Take 20 mg by mouth daily.    [provider]  furosemide (LASIX) 40 MG tablet Take 40 mg 2 (two) times daily by mouth. Take 40 mg in am and 20 mg  in evening    [provider]  insulin aspart (NOVOLOG) 100 UNIT/ML injection Inject 10 Units into the skin 3 (three) times daily before meals. 01/30/17   Glade LloydAlekh, Kshitiz, MD  insulin glargine (LANTUS) 100 UNIT/ML injection Inject 0.45 mLs (45 Units total) into the skin at bedtime. 01/30/17   Glade LloydAlekh, Kshitiz, MD  oxybutynin (DITROPAN-XL) 10 MG 24 hr tablet Take 10 mg by mouth at bedtime.    [provider]  pantoprazole (PROTONIX) 40 MG tablet Take 1 tablet (40 mg total) by mouth daily. 08/26/16 08/26/17  Clydia LlanoElmahi, Mutaz, MD  pravastatin (PRAVACHOL) 80 MG tablet Take 80 mg by mouth daily.    [provider]  tamsulosin (FLOMAX) 0.4 MG CAPS capsule Take 0.4 mg by mouth every morning.  08/20/16   [provider]    Family History Family History  Problem Relation Age of Onset  . Hypertension Mother   . Heart disease Mother   . Heart attack Father        died younger than pt's current age    Social History Social History   Tobacco Use  . Smoking status: Former Smoker    Last attempt to quit: 1959    Years since quitting: 60.2  . Smokeless tobacco: Never Used  Substance Use Topics  . Alcohol use: No  . Drug use: Not on file     Allergies   Patient has no known allergies.   Review of Systems Review of Systems  Constitutional: Negative for chills and fever.  HENT: Negative for congestion and facial swelling.   Eyes: Negative for discharge and visual disturbance.  Respiratory: Negative for shortness of breath.   Cardiovascular: Negative for chest pain and palpitations.  Gastrointestinal: Positive for abdominal pain, nausea and vomiting. Negative for diarrhea.  Musculoskeletal: Negative for arthralgias and myalgias.  Skin: Negative for color change and rash.  Neurological: Negative for tremors, syncope and headaches.  Psychiatric/Behavioral: Negative for confusion and dysphoric mood.     Physical Exam Updated Vital Signs BP (!) 145/68   Pulse  80   Temp 98.1 F (36.7 C) (Oral)   Resp 18   SpO2 96%   Physical Exam  Constitutional: He is oriented to person, place, and time. He appears well-developed and well-nourished.  HENT:  Head: Normocephalic and atraumatic.  Eyes: EOM are normal. Pupils are equal, round, and reactive to light.  Neck: Normal range of motion. Neck supple. No JVD present.  Cardiovascular: Normal rate and regular rhythm. Exam reveals no gallop and no friction rub.  No murmur heard. Pulmonary/Chest: No respiratory distress. He has no wheezes.  Abdominal: He exhibits no distension. There is tenderness in the right lower quadrant. There is no rebound and no guarding.  Musculoskeletal: Normal range of motion.  Neurological: He is alert and oriented to person, place, and time.  Skin: No rash noted. No pallor.  Psychiatric: He has a normal mood and  affect. His behavior is normal.  Nursing note and vitals reviewed.    ED Treatments / Results  Labs (all labs ordered are listed, but only abnormal results are displayed) Labs Reviewed  COMPREHENSIVE METABOLIC PANEL - Abnormal; Notable for the following components:      Result Value   CO2 19 (*)    Glucose, Bld 324 (*)    Creatinine, Ser 2.02 (*)    Total Bilirubin 1.3 (*)    GFR calc non Af Amer 30 (*)    GFR calc Af Amer 35 (*)    All other components within normal limits  CBC - Abnormal; Notable for the following components:   WBC 11.4 (*)    All other components within normal limits  URINALYSIS, ROUTINE W REFLEX MICROSCOPIC - Abnormal; Notable for the following components:   Glucose, UA >=500 (*)    Hgb urine dipstick SMALL (*)    Protein, ur 100 (*)    Squamous Epithelial / LPF 0-5 (*)    All other components within normal limits  I-STAT CHEM 8, ED - Abnormal; Notable for the following components:   BUN 23 (*)    Creatinine, Ser 1.80 (*)    Glucose, Bld 318 (*)    Calcium, Ion 1.08 (*)    All other components within normal limits  LIPASE, BLOOD      EKG  EKG Interpretation None       Radiology Ct Renal Stone Study  Result Date: 06/11/2017 CLINICAL DATA:  Acute onset of generalized abdominal pain, nausea and vomiting. EXAM: CT ABDOMEN AND PELVIS WITHOUT CONTRAST TECHNIQUE: Multidetector CT imaging of the abdomen and pelvis was performed following the standard protocol without IV contrast. COMPARISON:  None. FINDINGS: Lower chest: Minimal bibasilar atelectasis is noted. Coronary artery calcification is seen. Trace pericardial fluid remains within normal limits. Hepatobiliary: The liver is unremarkable in appearance. Mild soft tissue inflammation is suggested about the gallbladder. Would correlate clinically for evidence of cholecystitis. The common bile duct remains normal in caliber. Pancreas: The pancreas is within normal limits. Spleen: The spleen is unremarkable in appearance. Adrenals/Urinary Tract: The adrenal glands are unremarkable in appearance. A right renal cyst is noted. Nonspecific perinephric stranding is noted bilaterally. Mild bilateral renal atrophy is noted. There is no evidence of hydronephrosis. No renal or ureteral stones are identified. Stomach/Bowel: The stomach is unremarkable in appearance. The small bowel is within normal limits. The appendix is normal in caliber, without evidence of appendicitis. Mild diverticulosis is noted along the descending and proximal sigmoid colon, without evidence of diverticulitis. Vascular/Lymphatic: Scattered calcification is seen along the abdominal aorta and its branches. The abdominal aorta is otherwise grossly unremarkable. The inferior vena cava is grossly unremarkable. No retroperitoneal lymphadenopathy is seen. No pelvic sidewall lymphadenopathy is identified. Reproductive: The bladder is mildly distended and grossly unremarkable. The prostate is borderline normal in size. Other: No additional soft tissue abnormalities are seen. Musculoskeletal: No acute osseous abnormalities are  identified. The visualized musculature is unremarkable in appearance. IMPRESSION: 1. Suggestion of mild soft tissue inflammation about the gallbladder. Would correlate clinically for evidence of cholecystitis. 2. Coronary artery calcifications seen. 3. Right renal cyst noted.  Mild bilateral renal atrophy noted. 4. Mild diverticulosis along the descending and proximal sigmoid colon, without evidence of diverticulitis. Electronically Signed   By: Roanna Raider M.D.   On: 06/11/2017 06:37    Procedures Procedures (including critical care time)  Medications Ordered in ED Medications  piperacillin-tazobactam (ZOSYN) IVPB 3.375 g (3.375 g  Intravenous New Bag/Given 06/11/17 0702)  morphine 4 MG/ML injection 4 mg (4 mg Intravenous Given 06/11/17 0520)  ondansetron (ZOFRAN) injection 4 mg (4 mg Intravenous Given 06/11/17 0520)     Initial Impression / Assessment and Plan / ED Course  I have reviewed the triage vital signs and the nursing notes.  Pertinent labs & imaging results that were available during my care of the patient were reviewed by me and considered in my medical decision making (see chart for details).     77 yo M with a chief complaint of right-sided abdominal pain.  This been going on for about 7 hours now.  Patient appears uncomfortable on my exam.  He has a very mild right lower quadrant tenderness.  Will obtain a CT stone study.  Labs.  Give IV fluids and pain meds reassess.  Ct concerning for cholecystitis. Patient reassessed with significant improvement of his pain, but now the exam has focal pain to the RUQ, + Murphys.  Discussed with gen surgery who will come eval, but with his multiple medical comorbities on eliquis recommended medical admit.    The patients results and plan were reviewed and discussed.   Any x-rays performed were independently reviewed by myself.   Differential diagnosis were considered with the presenting HPI.  Medications  piperacillin-tazobactam  (ZOSYN) IVPB 3.375 g (3.375 g Intravenous New Bag/Given 06/11/17 1610)  morphine 4 MG/ML injection 4 mg (4 mg Intravenous Given 06/11/17 0520)  ondansetron (ZOFRAN) injection 4 mg (4 mg Intravenous Given 06/11/17 0520)    Vitals:   06/11/17 0433 06/11/17 0642  BP: (!) 169/66 (!) 145/68  Pulse: 60 80  Resp: (!) 22 18  Temp: 98.1 F (36.7 C)   TempSrc: Oral   SpO2: 97% 96%    Final diagnoses:  Acute cholecystitis    Admission/ observation were discussed with the admitting physician, patient and/or family and they are comfortable with the plan.      Final Clinical Impressions(s) / ED Diagnoses   Final diagnoses:  Acute cholecystitis    ED Discharge Orders    None       Melene Plan, DO 06/11/17 [id number]

## 2017-06-11 NOTE — Consult Note (Signed)
[facility] Surgery Consult Note  Joshua Dalton [birth year]  [id number].    Requesting MD: Tyrone Nine Chief Complaint/Reason for Consult: Acute cholecystitis HPI:  Patient is a 77 year old male who presented to [facility] with abdominal pain. Pain started around 7 PM yesterday, described as a crampy pain like a stomach ache. Pain was felt mostly along lower abdomen bilaterally. Patient had one episode non-bloody emesis. Patient currently denies nausea, and reports only mild abdominal pain that feels like a stomach ache. Patient denies fever, chills, diarrhea/constipation, melena, hematochezia. Patient does report SOB which he has been experiencing over the last year and progressively worsened. 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 Patient takes eliquis twice daily, last dose given today. Patient denies past abdominal surgeries.   ROS: Review of Systems  Constitutional: Negative for chills and fever.  Respiratory: Positive for shortness of breath.   Cardiovascular: Positive for leg swelling. Negative for chest pain.  Gastrointestinal: Positive for abdominal pain, nausea and vomiting. Negative for blood in stool, constipation, diarrhea and melena.  Genitourinary: Negative for dysuria, frequency and urgency.  All other systems reviewed and are negative.   Family History  Problem Relation Age of Onset  . Hypertension Mother   . Heart disease Mother   . Heart attack Father        died younger than pt's current age    Past Medical History:  Diagnosis Date  . Asthma   . Chronic diastolic (congestive) heart failure (HCC)    a. echo in 08/2016 showed a preserved EF of 60-65%, Grade 1 DD, and no WMA. Mild AI noted.   . Chronic kidney disease, stage III (moderate) ([place])   . Diabetes mellitus without complication ([place])   . Heart disease   . Hypercholesteremia   . Hypertension   .  Obstructive sleep apnea   . Personal history of venous thrombosis and embolism     Past Surgical History:  Procedure Laterality Date  . CARDIAC SURGERY    . ESOPHAGOGASTRODUODENOSCOPY (EGD) WITH PROPOFOL N/A 08/26/2016   Procedure: ESOPHAGOGASTRODUODENOSCOPY (EGD) WITH PROPOFOL;  Surgeon: Otis Brace, MD;  Location: [place];  Service: Gastroenterology;  Laterality: N/A;    Social History:  reports that he quit smoking about 60 years ago. he has never used smokeless tobacco. He reports that he does not drink alcohol. His drug history is not on file.  Allergies: No Known Allergies  Medications Prior to Admission  Medication Sig Dispense Refill  . allopurinol (ZYLOPRIM) 300 MG tablet Take 300 mg by mouth daily.    Marland Kitchen apixaban (ELIQUIS) 5 MG TABS tablet 24m BID till 02/04/17 then 553mBID from 02/05/17 onwards 60 tablet 0  . Artificial Saliva (BIOTENE MOISTURIZING MOUTH MT) Use as directed 1 Dose in the mouth or throat as needed (dry mouth).    . fenofibrate (TRICOR) 145 MG tablet Take 145 mg by mouth daily.    . fexofenadine (ALLEGRA) 180 MG tablet Take 180 mg by mouth daily.    . finasteride (PROSCAR) 5 MG tablet Take 5 mg by mouth daily.    . Marland KitchenLUoxetine (PROZAC) 20 MG capsule Take 20 mg by mouth daily.    . fluticasone (FLONASE) 50 MCG/ACT nasal spray Place 2 sprays into both nostrils daily.    . furosemide (LASIX) 40 MG tablet Take 40 mg 2 (two) times daily by mouth. Take 40  mg in am and 20 mg in evening    . insulin aspart (NOVOLOG) 100 UNIT/ML injection Inject 10 Units into the skin 3 (three) times daily before meals. (Patient taking differently: Inject 20 Units into the skin 3 (three) times daily before meals. )    . insulin glargine (LANTUS) 100 UNIT/ML injection Inject 0.45 mLs (45 Units total) into the skin at bedtime. (Patient taking differently: Inject 70 Units into the skin at bedtime. )    . Misc Natural Products (TART CHERRY ADVANCED PO) Take 1 Dose by mouth as needed  (arthritis).    Marland Kitchen oxybutynin (DITROPAN-XL) 10 MG 24 hr tablet Take 10 mg by mouth at bedtime.    . pravastatin (PRAVACHOL) 80 MG tablet Take 80 mg by mouth daily.    . tamsulosin (FLOMAX) 0.4 MG CAPS capsule Take 0.4 mg by mouth every morning.     . furosemide (LASIX) 40 MG tablet TAKE 1 TABLET BY MOUTH IN THE MORNING AND 1/2 (ONE-HALF) IN THE EVENING 135 tablet 2  . pantoprazole (PROTONIX) 40 MG tablet Take 1 tablet (40 mg total) by mouth daily. (Patient not taking: Reported on 06/11/2017) 30 tablet 1    Blood pressure 128/69, pulse 87, temperature 98.5 F (36.9 C), temperature source Oral, resp. rate 18, SpO2 95 %. Physical Exam: Physical Exam  Constitutional: He is oriented to person, place, and time. He appears well-developed. He is cooperative.  Non-toxic appearance. No distress.  HENT:  Head: Normocephalic and atraumatic.  Right Ear: External ear normal.  Left Ear: External ear normal.  Nose: Nose normal.  Mouth/Throat: Mucous membranes are dry.  Eyes: Conjunctivae and EOM are normal. Pupils are equal, round, and reactive to light. Right eye exhibits discharge. Left eye exhibits discharge. No scleral icterus.  Dried filmy looking discharge on lash lines bilaterally  Neck: Normal range of motion and phonation normal. Neck supple.  Cardiovascular: Normal rate and regular rhythm.  Pulses:      Carotid pulses are 2+ on the right side, and 2+ on the left side.      Popliteal pulses are 2+ on the right side, and 2+ on the left side.  No LE edema bilaterally   Pulmonary/Chest: Effort normal.  Breath sounds diminished throughout bilaterally   Abdominal: Soft. Bowel sounds are normal. He exhibits no distension and no mass. There is no hepatosplenomegaly. There is tenderness in the right upper quadrant. There is no rigidity, no rebound, no guarding and negative Murphy's sign.  Musculoskeletal:  ROM grossly intact in bilateral upper and lower extremities  Neurological: He is alert and  oriented to person, place, and time. He has normal strength.  Skin: Skin is warm, dry and intact. He is not diaphoretic.  Psychiatric: He has a normal mood and affect. His speech is normal and behavior is normal.    Results for orders placed or performed during the hospital encounter of 06/11/17 (from the past 48 hour(s))  Urinalysis, Routine w reflex microscopic     Status: Abnormal   Collection Time: 06/11/17  4:32 AM  Result Value Ref Range   Color, Urine YELLOW YELLOW   APPearance CLEAR CLEAR   Specific Gravity, Urine 1.014 1.005 - 1.030   pH 8.0 5.0 - 8.0   Glucose, UA >=500 (A) NEGATIVE mg/dL   Hgb urine dipstick SMALL (A) NEGATIVE   Bilirubin Urine NEGATIVE NEGATIVE   Ketones, ur NEGATIVE NEGATIVE mg/dL   Protein, ur 100 (A) NEGATIVE mg/dL   Nitrite NEGATIVE NEGATIVE   Leukocytes,  UA NEGATIVE NEGATIVE   RBC / HPF 0-5 0 - 5 RBC/hpf   WBC, UA 0-5 0 - 5 WBC/hpf   Bacteria, UA NONE SEEN NONE SEEN   Squamous Epithelial / LPF 0-5 (A) NONE SEEN   Mucus PRESENT     Comment: Performed at [facility], [place] [address] [place], [place] [address]  Lipase, blood     Status: None   Collection Time: 06/11/17  4:34 AM  Result Value Ref Range   Lipase 27 11 - 51 U/L    Comment: Performed at [facility] [address] [place], [place] [address]  Comprehensive metabolic panel     Status: Abnormal   Collection Time: 06/11/17  4:34 AM  Result Value Ref Range   Sodium 136 135 - 145 mmol/L   Potassium 4.0 3.5 - 5.1 mmol/L    Comment: HEMOLYSIS AT THIS LEVEL MAY AFFECT RESULT   Chloride 103 101 - 111 mmol/L   CO2 19 (L) 22 - 32 mmol/L   Glucose, Bld 324 (H) 65 - 99 mg/dL   BUN 20 6 - 20 mg/dL   Creatinine, Ser 2.02 (H) 0.61 - 1.24 mg/dL   Calcium 9.6 8.9 - 10.3 mg/dL   Total Protein 6.9 6.5 - 8.1 g/dL   Albumin 3.6 3.5 - 5.0 g/dL   AST 32 15 - 41 U/L   ALT 19 17 - 63 U/L   Alkaline Phosphatase 91 38 - 126 U/L   Total Bilirubin 1.3 (H) 0.3 - 1.2 mg/dL   GFR calc non Af  Amer 30 (L) >60 mL/min   GFR calc Af Amer 35 (L) >60 mL/min    Comment: (NOTE) The eGFR has been calculated using the CKD EPI equation. This calculation has not been validated in all clinical situations. eGFR's persistently <60 mL/min signify possible Chronic Kidney Disease.    Anion gap 14 5 - 15    Comment: Performed at [facility] [address], [place], [place] [address]  CBC     Status: Abnormal   Collection Time: 06/11/17  4:34 AM  Result Value Ref Range   WBC 11.4 (H) 4.0 - 10.5 K/uL   RBC 5.04 4.22 - 5.81 MIL/uL   Hemoglobin 16.5 13.0 - 17.0 g/dL   HCT 48.1 39.0 - 52.0 %   MCV 95.4 78.0 - 100.0 fL   MCH 32.7 26.0 - 34.0 pg   MCHC 34.3 30.0 - 36.0 g/dL   RDW 14.9 11.5 - 15.5 %   Platelets 273 150 - 400 K/uL    Comment: Performed at [facility], [place] [address] [place], [place] [address]  I-Stat Chem 8, ED     Status: Abnormal   Collection Time: 06/11/17  4:41 AM  Result Value Ref Range   Sodium 139 135 - 145 mmol/L   Potassium 3.6 3.5 - 5.1 mmol/L   Chloride 103 101 - 111 mmol/L   BUN 23 (H) 6 - 20 mg/dL   Creatinine, Ser 1.80 (H) 0.61 - 1.24 mg/dL   Glucose, Bld 318 (H) 65 - 99 mg/dL   Calcium, Ion 1.08 (L) 1.15 - 1.40 mmol/L   TCO2 22 22 - 32 mmol/L   Hemoglobin 17.0 13.0 - 17.0 g/dL   HCT 50.0 39.0 - 52.0 %  Glucose, capillary     Status: Abnormal   Collection Time: 06/11/17 12:14 PM  Result Value Ref Range   Glucose-Capillary 259 (H) 65 - 99 mg/dL  Ct Renal Stone Study  Result Date: 06/11/2017 CLINICAL DATA:  Acute onset of generalized abdominal pain, nausea and vomiting. EXAM: CT ABDOMEN AND PELVIS WITHOUT CONTRAST TECHNIQUE: Multidetector CT imaging of the abdomen and pelvis was performed following the standard protocol without IV contrast. COMPARISON:  None. FINDINGS: Lower chest: Minimal bibasilar atelectasis is noted. Coronary artery calcification is seen. Trace pericardial fluid remains within normal limits. Hepatobiliary: The liver is  unremarkable in appearance. Mild soft tissue inflammation is suggested about the gallbladder. Would correlate clinically for evidence of cholecystitis. The common bile duct remains normal in caliber. Pancreas: The pancreas is within normal limits. Spleen: The spleen is unremarkable in appearance. Adrenals/Urinary Tract: The adrenal glands are unremarkable in appearance. A right renal cyst is noted. Nonspecific perinephric stranding is noted bilaterally. Mild bilateral renal atrophy is noted. There is no evidence of hydronephrosis. No renal or ureteral stones are identified. Stomach/Bowel: The stomach is unremarkable in appearance. The small bowel is within normal limits. The appendix is normal in caliber, without evidence of appendicitis. Mild diverticulosis is noted along the descending and proximal sigmoid colon, without evidence of diverticulitis. Vascular/Lymphatic: Scattered calcification is seen along the abdominal aorta and its branches. The abdominal aorta is otherwise grossly unremarkable. The inferior vena cava is grossly unremarkable. No retroperitoneal lymphadenopathy is seen. No pelvic sidewall lymphadenopathy is identified. Reproductive: The bladder is mildly distended and grossly unremarkable. The prostate is borderline normal in size. Other: No additional soft tissue abnormalities are seen. Musculoskeletal: No acute osseous abnormalities are identified. The visualized musculature is unremarkable in appearance. IMPRESSION: 1. Suggestion of mild soft tissue inflammation about the gallbladder. Would correlate clinically for evidence of cholecystitis. 2. Coronary artery calcifications seen. 3. Right renal cyst noted.  Mild bilateral renal atrophy noted. 4. Mild diverticulosis along the descending and proximal sigmoid colon, without evidence of diverticulitis. Electronically Signed   By: Garald Balding M.D.   On: 06/11/2017 06:37   US Abdomen Limited Ruq  Result Date: 06/11/2017 CLINICAL DATA:   Chronic right upper quadrant pain EXAM: ULTRASOUND ABDOMEN LIMITED RIGHT UPPER QUADRANT COMPARISON:  None. FINDINGS: Gallbladder: No gallstones are seen. Sludge is seen in the gallbladder. The gallbladder wall does appear thickened, and there is mild pericholecystic fluid. No sonographic Murphy sign noted by sonographer. Common bile duct: Diameter: 5 mm. No intrahepatic or extrahepatic biliary duct dilatation. Liver: No focal lesion identified. Liver echogenicity is increased diffusely. Portal vein is patent on color Doppler imaging with normal direction of blood flow towards the liver. IMPRESSION: 1. Gallbladder wall thickening with pericholecystic fluid. Mild sludge in gallbladder but no gallstones seen. Suspect acalculous cholecystitis. In this regard, it may be reasonable to correlate with nuclear medicine hepatobiliary imaging study to assess for cystic duct patency. 2. Diffuse increase in liver echogenicity, a finding most likely indicative of hepatic steatosis. While no focal liver lesions are evident on this study, it must be cautioned that the sensitivity of ultrasound for detection of focal liver lesions is diminished in this circumstance. Electronically Signed   By: Lowella Grip III M.D.   On: 06/11/2017 09:29      Assessment/Plan CKD stage III E8BT Chronic diastolic heart failure CAD with Hx of MI Asthma HTN Hx of DVT  Acute cholecystitis - CT 3/11: mild inflammation around the gallbladder - US: wall thickening with pericholecystic fluid, no stones seen  - WBC 11.4, afebrile - pericholecystic fluid is a non-specific finding in patient with CKD and CHF. Patient tender on the right side but only  mildly so. Will order HIDA to confirm acute cholecystitis - given MMP with significant cardiac history patient is not a great surgical candidate, if HIDA is positive would recommend percutaneous drainage - will need to be off eliquis prior to any kind of procedure, heparin should be  fine  FEN: NPO, IVF VTE: SCDs, hold eliquis and start heparin ID: IV Zosyn 3/11>>>  Plan: Patient anticoagulated on Eliquis, last dose this AM. Stop eliquis. HIDA pending, if positive recommend percutaneous cholecystostomy tube. Patient may have CLD as tolerated after HIDA. Will consult pharmacy for heparin.   Brigid Re, [facility] Surgery 06/11/2017, 12:48 PM Pager: [phone] Consults: [phone] Mon-Fri 7:00 am-4:30 pm Sat-Sun 7:00 am-11:30 am

## 2017-06-12 ENCOUNTER — Encounter (HOSPITAL_COMMUNITY): Payer: Self-pay | Admitting: Cardiology

## 2017-06-12 ENCOUNTER — Inpatient Hospital Stay (HOSPITAL_COMMUNITY): Payer: Medicare Other

## 2017-06-12 DIAGNOSIS — I2510 Atherosclerotic heart disease of native coronary artery without angina pectoris: Secondary | ICD-10-CM

## 2017-06-12 DIAGNOSIS — K810 Acute cholecystitis: Secondary | ICD-10-CM

## 2017-06-12 DIAGNOSIS — K8001 Calculus of gallbladder with acute cholecystitis with obstruction: Principal | ICD-10-CM

## 2017-06-12 DIAGNOSIS — R0989 Other specified symptoms and signs involving the circulatory and respiratory systems: Secondary | ICD-10-CM

## 2017-06-12 DIAGNOSIS — Z86718 Personal history of other venous thrombosis and embolism: Secondary | ICD-10-CM

## 2017-06-12 DIAGNOSIS — N183 Chronic kidney disease, stage 3 (moderate): Secondary | ICD-10-CM

## 2017-06-12 DIAGNOSIS — E876 Hypokalemia: Secondary | ICD-10-CM

## 2017-06-12 DIAGNOSIS — Z01810 Encounter for preprocedural cardiovascular examination: Secondary | ICD-10-CM

## 2017-06-12 LAB — COMPREHENSIVE METABOLIC PANEL
ALBUMIN: 3.1 g/dL — AB (ref 3.5–5.0)
ALT: 13 U/L — AB (ref 17–63)
AST: 15 U/L (ref 15–41)
Alkaline Phosphatase: 65 U/L (ref 38–126)
Anion gap: 10 (ref 5–15)
BILIRUBIN TOTAL: 1.3 mg/dL — AB (ref 0.3–1.2)
BUN: 13 mg/dL (ref 6–20)
CHLORIDE: 100 mmol/L — AB (ref 101–111)
CO2: 30 mmol/L (ref 22–32)
Calcium: 9 mg/dL (ref 8.9–10.3)
Creatinine, Ser: 1.92 mg/dL — ABNORMAL HIGH (ref 0.61–1.24)
GFR calc Af Amer: 37 mL/min — ABNORMAL LOW (ref >60)
GFR calc non Af Amer: 32 mL/min — ABNORMAL LOW (ref >60)
GLUCOSE: 87 mg/dL (ref 65–99)
POTASSIUM: 3.2 mmol/L — AB (ref 3.5–5.1)
Sodium: 140 mmol/L (ref 135–145)
Total Protein: 6.2 g/dL — ABNORMAL LOW (ref 6.5–8.1)

## 2017-06-12 LAB — APTT
aPTT: 61 seconds — ABNORMAL HIGH (ref 24–36)
aPTT: 69 seconds — ABNORMAL HIGH (ref 24–36)

## 2017-06-12 LAB — GLUCOSE, CAPILLARY
GLUCOSE-CAPILLARY: 69 mg/dL (ref 65–99)
Glucose-Capillary: 84 mg/dL (ref 65–99)
Glucose-Capillary: 87 mg/dL (ref 65–99)
Glucose-Capillary: 155 mg/dL — ABNORMAL HIGH (ref 65–99)
Glucose-Capillary: 135 mg/dL — ABNORMAL HIGH (ref 65–99)

## 2017-06-12 LAB — CBC
HEMATOCRIT: 45.7 % (ref 39.0–52.0)
HEMOGLOBIN: 15.3 g/dL (ref 13.0–17.0)
MCH: 32.5 pg (ref 26.0–34.0)
MCHC: 33.5 g/dL (ref 30.0–36.0)
MCV: 97 fL (ref 78.0–100.0)
Platelets: 237 10*3/uL (ref 150–400)
RBC: 4.71 MIL/uL (ref 4.22–5.81)
RDW: 14.8 % (ref 11.5–15.5)
WBC: 9.8 10*3/uL (ref 4.0–10.5)

## 2017-06-12 LAB — HEPARIN LEVEL (UNFRACTIONATED): Heparin Unfractionated: 2.08 IU/mL — ABNORMAL HIGH (ref 0.30–0.70)

## 2017-06-12 MED ORDER — INSULIN GLARGINE 100 UNIT/ML ~~LOC~~ SOLN
20.0000 [IU] | Freq: Every day | SUBCUTANEOUS | Status: DC
  Administered 2017-06-12 – 2017-06-13 (×2): 20 [IU] via SUBCUTANEOUS
  Filled 2017-06-12 (×2): qty 0.2

## 2017-06-12 MED ORDER — MORPHINE SULFATE (PF) 4 MG/ML IV SOLN
INTRAVENOUS | Status: AC
  Filled 2017-06-12: qty 1

## 2017-06-12 MED ORDER — POTASSIUM CHLORIDE CRYS ER 20 MEQ PO TBCR
40.0000 meq | EXTENDED_RELEASE_TABLET | Freq: Once | ORAL | Status: AC
  Administered 2017-06-12: 40 meq via ORAL
  Filled 2017-06-12: qty 2

## 2017-06-12 MED ORDER — TECHNETIUM TC 99M MEBROFENIN IV KIT
5.0000 | PACK | Freq: Once | INTRAVENOUS | Status: AC | PRN
  Administered 2017-06-12: 5 via INTRAVENOUS

## 2017-06-12 NOTE — Progress Notes (Signed)
Blood sugar 69, pt is A&O x4, feeling ok.  4 oz apple juice given. Latest blood sugar 87.

## 2017-06-12 NOTE — Progress Notes (Signed)
ANTICOAGULATION CONSULT NOTE  Pharmacy Consult for heparin Indication: DVT history  Heparin Dosing Weight: 86.4 kg  Labs: Recent Labs    06/11/17 0434 06/11/17 0441 06/11/17 1445 06/12/17 0622 06/12/17 0623  HGB 16.5 17.0  --  15.3  --   HCT 48.1 50.0  --  45.7  --   PLT 273  --   --  237  --   APTT  --   --  28  --  61*  HEPARINUNFRC  --   --  >2.20* 2.08*  --   CREATININE 2.02* 1.80*  --  1.92*  --     Assessment: 76 yom with acute cholecystis on apixaban PTA for hx of DVT/PE - no active VTE. Pharmacy consulted to start heparin when apixaban on hold if case of procedures - last dose on 3/11 at 1306. CBC wnl. Will follow aPTT while apixaban expected to influence heparin levels.  Initial aPTT drawn early, ~5.5 hour level, slightly low at 61. Heparin level remains elevated as expected. Will increase slightly and check another level. RN states will be unable to increase until patient returns from HIDA scan.  Goal of Therapy:  Heparin level 0.3-0.7 units/ml aPTT 66-102 seconds Monitor platelets by anticoagulation protocol: Yes   Plan:  Increase heparin to 1400 units/hr 8h aPTT, Daily heparin level/aPTT/CBC Monitor s/sx bleeding, f/u Surgery plans   Babs BertinHaley Kiana Hollar, PharmD, BCPS Clinical Pharmacist 06/12/2017 7:49 AM

## 2017-06-12 NOTE — Progress Notes (Signed)
Medicine attending: I examined this patient today together with resident physician Dr. Lorenso CourierVahini Chundi and I concur with her evaluation and management plan which we discussed together. He is still experiencing right upper quadrant discomfort.  He remains afebrile.  Currently on empiric Zosyn.  White count on admission 11,000.  Repeat today 9800. Nuclear medicine gallbladder scan shows nonvisualization of the gallbladder consistent with cystic duct obstruction.  Common bile duct is patent.  He is being considered for surgery.  Surgery has called in a cardiology consultation in view of his underlying cardiac, pulmonary, and renal comorbidities.  He is felt to be an acceptable risk for surgery from a cardiovascular standpoint. 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  6 months would be optimal interval to wait for elective surgery, 3 months for urgent surgery.  He is right in the middle of this interval at 4 months. I think it would be safe to proceed with surgery keeping anticoagulation tight around the procedure.  He has been started on a heparin infusion.  I would try to resume the heparin 6-8 hours postop initially without a loading dose and then escalate 24 hours postop to full dose then resume his Eliquis.  In view of his borderline renal function and his age I would strongly consider reducing his Eliquis dose by 50%.

## 2017-06-12 NOTE — Progress Notes (Signed)
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 HIDA positive, cards clearance pending. Will follow.    LOS: 1 day    Subjective: CC: abdominal soreness  No pain at present time. Just had morphine. No nausea or vomiting. No new complaints. About to walk with PT.   Objective: Vital signs in last 24 hours: Temp:  [97.8 F (36.6 C)-98.5 F (36.9 C)] 98.1 F (36.7 C) (03/12 1100) Pulse Rate:  [57-75] 64 (03/12 1100) Resp:  [16-18] 16 (03/12 1100) BP: (117-140)/(71-74) 140/74 (03/12 1100) SpO2:  [93 %-97 %] 97 % (03/12 1100) FiO2 (%):  [2 %] 2 % (03/12 0529) Weight:  [224 lb 13.9 oz (102 kg)] 224 lb 13.9 oz (102 kg) (03/11 1426) Last BM Date: 06/11/17  Intake/Output from previous day: 03/11 0701 - 03/12 0700 In: 117 [I.V.:17; IV Piggyback:100] Out: 1150 [Urine:1150] Intake/Output this shift: Total I/O In: 0  Out: 550 [Urine:550]  PE: Gen:  Alert, NAD, pleasant, cooperative Card:  RRR Pulm:  Rate and effort normal Abd: Soft, ND, +BS, mild TTP in RUQ no guarding Skin: no rashes noted, warm and dry   Anti-infectives: Anti-infectives (From admission, onward)   Start     Dose/Rate Route Frequency Ordered Stop   06/11/17 1400  piperacillin-tazobactam (ZOSYN) IVPB 3.375 g     3.375 g 12.5 mL/hr over 240 Minutes Intravenous Every 8 hours 06/11/17 0847     06/11/17 0700  piperacillin-tazobactam (ZOSYN) IVPB 3.375 g     3.375 g 100 mL/hr over 30 Minutes Intravenous  Once 06/11/17 0647  06/11/17 0732      Lab Results:  Recent Labs    06/11/17 0434 06/11/17 0441 06/12/17 0622  WBC 11.4*  --  9.8  HGB 16.5 17.0 15.3  HCT 48.1 50.0 45.7  PLT 273  --  237   BMET Recent Labs    06/11/17 0434 06/11/17 0441 06/12/17 0622  NA 136 139 140  K 4.0 3.6 3.2*  CL 103 103 100*  CO2 19*  --  30  GLUCOSE 324* 318* 87  BUN 20 23* 13  CREATININE 2.02* 1.80* 1.92*  CALCIUM 9.6  --  9.0   PT/INR No results for input(s): LABPROT, INR in the last 72 hours. CMP     Component Value Date/Time   NA 140 06/12/2017 0622   NA 141 02/06/2017 0000   K 3.2 (L) 06/12/2017 0622   CL 100 (L) 06/12/2017 0622   CO2 30 06/12/2017 0622   GLUCOSE 87 06/12/2017 0622   BUN 13 06/12/2017 0622   BUN 25 02/06/2017 0000   CREATININE 1.92 (H) 06/12/2017 0622   CALCIUM 9.0 06/12/2017 0622   PROT 6.2 (L) 06/12/2017 0622   ALBUMIN 3.1 (L) 06/12/2017 0622   AST 15 06/12/2017 0622   ALT 13 (L) 06/12/2017 0622   ALKPHOS 65 06/12/2017 0622   BILITOT 1.3 (H) 06/12/2017 0622   GFRNONAA 32 (L) 06/12/2017 0622   GFRAA 37 (L) 06/12/2017 0622   Lipase     Component Value  Date/Time   LIPASE 27 06/11/2017 0434    Studies/Results: Nm Hepato W/eject Fract  Result Date: 06/12/2017 CLINICAL DATA:  Right upper quadrant pain with nausea and vomiting EXAM: NUCLEAR MEDICINE HEPATOBILIARY IMAGING VIEWS: Anterior right upper abdomen RADIOPHARMACEUTICALS:  6.2 mCi Tc-8166m  Choletec IV COMPARISON:  Ultrasound right upper quadrant June 11, 2017 FINDINGS: Initially, images were obtained over a 90 minutes time span after administration of 5.2 millicurie of technetium 5899 M Choletec without visualization of the gallbladder. At that time, an additional 1.0 millicurie of technetium 6199 M Choletec was administered as well as 3.0 mg of morphine intravenously. Images were obtained over no additional 30 minutes. Prompt uptake and biliary excretion of activity by the liver is seen. Small bowel activity is seen promptly  indicating patency of the common bile duct. Gallbladder never visualizes despite additional radiotracer administration and intravenous morphine administration. IMPRESSION: Nonvisualization of gallbladder, a finding felt to be indicative cystic duct obstruction. Common bile duct is patent as is evidenced by visualization of small bowel. Liver uptake of radiotracer normal. Electronically Signed   By: Bretta BangWilliam  Woodruff III M.D.   On: 06/12/2017 10:58   Ct Renal Stone Study  Result Date: 06/11/2017 CLINICAL DATA:  Acute onset of generalized abdominal pain, nausea and vomiting. EXAM: CT ABDOMEN AND PELVIS WITHOUT CONTRAST TECHNIQUE: Multidetector CT imaging of the abdomen and pelvis was performed following the standard protocol without IV contrast. COMPARISON:  None. FINDINGS: Lower chest: Minimal bibasilar atelectasis is noted. Coronary artery calcification is seen. Trace pericardial fluid remains within normal limits. Hepatobiliary: The liver is unremarkable in appearance. Mild soft tissue inflammation is suggested about the gallbladder. Would correlate clinically for evidence of cholecystitis. The common bile duct remains normal in caliber. Pancreas: The pancreas is within normal limits. Spleen: The spleen is unremarkable in appearance. Adrenals/Urinary Tract: The adrenal glands are unremarkable in appearance. A right renal cyst is noted. Nonspecific perinephric stranding is noted bilaterally. Mild bilateral renal atrophy is noted. There is no evidence of hydronephrosis. No renal or ureteral stones are identified. Stomach/Bowel: The stomach is unremarkable in appearance. The small bowel is within normal limits. The appendix is normal in caliber, without evidence of appendicitis. Mild diverticulosis is noted along the descending and proximal sigmoid colon, without evidence of diverticulitis. Vascular/Lymphatic: Scattered calcification is seen along the abdominal aorta and its branches. The abdominal aorta is  otherwise grossly unremarkable. The inferior vena cava is grossly unremarkable. No retroperitoneal lymphadenopathy is seen. No pelvic sidewall lymphadenopathy is identified. Reproductive: The bladder is mildly distended and grossly unremarkable. The prostate is borderline normal in size. Other: No additional soft tissue abnormalities are seen. Musculoskeletal: No acute osseous abnormalities are identified. The visualized musculature is unremarkable in appearance. IMPRESSION: 1. Suggestion of mild soft tissue inflammation about the gallbladder. Would correlate clinically for evidence of cholecystitis. 2. Coronary artery calcifications seen. 3. Right renal cyst noted.  Mild bilateral renal atrophy noted. 4. Mild diverticulosis along the descending and proximal sigmoid colon, without evidence of diverticulitis. Electronically Signed   By: Roanna RaiderJeffery  Chang M.D.   On: 06/11/2017 06:37   [place] Abdomen Limited Ruq  Result Date: 06/11/2017 CLINICAL DATA:  Chronic right upper quadrant pain EXAM: ULTRASOUND ABDOMEN LIMITED RIGHT UPPER QUADRANT COMPARISON:  None. FINDINGS: Gallbladder: No gallstones are seen. Sludge is seen in the gallbladder. The gallbladder wall does appear thickened, and there is mild pericholecystic fluid. No sonographic Murphy sign noted by sonographer. Common bile duct: Diameter: 5 mm. No intrahepatic or extrahepatic biliary duct dilatation.  Liver: No focal lesion identified. Liver echogenicity is increased diffusely. Portal vein is patent on color Doppler imaging with normal direction of blood flow towards the liver. IMPRESSION: 1. Gallbladder wall thickening with pericholecystic fluid. Mild sludge in gallbladder but no gallstones seen. Suspect acalculous cholecystitis. In this regard, it may be reasonable to correlate with nuclear medicine hepatobiliary imaging study to assess for cystic duct patency. 2. Diffuse increase in liver echogenicity, a finding most likely indicative of hepatic steatosis. While  no focal liver lesions are evident on this study, it must be cautioned that the sensitivity of ultrasound for detection of focal liver lesions is diminished in this circumstance. Electronically Signed   By: Bretta Bang III M.D.   On: 06/11/2017 09:29      Jerre Simon , [facility] 06/12/2017, 12:05 PM  Pager: [phone] Mon-Wed, Friday 7:00am-4:30pm Thurs 7am-11:30am  Consults: [phone]

## 2017-06-12 NOTE — Progress Notes (Signed)
   Subjective:  Doing well this afternoon without any pain currently. Denies any nausea or vomiting. Tolerating clear liquid diet. Able to work with PT today without issue.   Objective:  Vital signs in last 24 hours: Vitals:   06/11/17 2140 06/12/17 0529 06/12/17 1100 06/12/17 1319  BP:  117/73 140/74 105/62  Pulse: 75 (!) 57 64 73  Resp: 18  16 18   Temp:  97.8 F (36.6 C) 98.1 F (36.7 C) 98.2 F (36.8 C)  TempSrc:  Oral Oral Oral  SpO2: 97% 93% 97% 98%  Weight:      Height:       Physical Exam  Constitutional: He appears well-developed and well-nourished. No distress.  HENT:  Head: Normocephalic and atraumatic.  Eyes: Conjunctivae are normal.  Cardiovascular: Normal rate, regular rhythm and normal heart sounds.  Respiratory: Effort normal. No respiratory distress. He has rales (bilateral bases).  GI: Soft. Bowel sounds are normal. He exhibits no distension. There is tenderness (right upper abdomen).  Musculoskeletal: He exhibits no edema.  Neurological: He is alert.  Skin: He is not diaphoretic. No erythema.  Psychiatric: He has a normal mood and affect. His behavior is normal. Judgment and thought content normal.   Assessment/Plan:  Acalculous Acute Cholecystitis: HIDA scan done today suggestive of cholecystitis. Gallbladder not visualized, thought to indicate cystic duct obstruction. CBD patent. He remains afebrile and leukocytosis is improving 11.4 > 9.8. He is doing well clinically; tolerating clear liquids and abdominal pain is well controlled. Continuing Zosyn. His course is complicated as he is only approximately 4 months out from extensive right lower extremity DVT and probably right lobe PE. Surgery on board and plan for cholecystectomy as long as cleared by cardiology. Will need tight control of his anticoagulation surrounding surgery. Holding eliquis, starting heparin. NPO at midnight.   Hypokalemia: K 3.2 today. Replace with Kdur 40 mEq today. Follow levels.    History of DVTs and PE: Patient with history of DVT and 2016.  He had been on Pradaxa.  This was discontinued in October 2018 patient had subsequent right lower extremity DVT and probable acute PE (VQ scan with intermediate probability) within 2 weeks of stopping the medication.  He has been on Eliquis since that time. Holding eliqiuis and starting heparin.   Insulin-dependent diabetes mellitus type 2: CBGs well controlled today in the 80s. Patient asymptomatic. Continue lantus 30 units qhs. SSO-s.   CKD stage III: Cr stable at 1.9.   Hypertension: Blood pressure remains well controlled.    Hyperlipidemia: Patient is on pravastatin 80 mg and fenofibrate 145 mg daily.  Can consider changing pravastatin to atorvastatin or rosuvastatin though this can likely be followed up as an outpatient.  OSA: CPAP nightly  Heart failure with preserved ejection fracture: 2D Echo done 01/27/2017 with EF 60-65% and grade 1 diastolic dysfunction.  Study limited by poor images.  We will continue his home Lasix dosing of 40 mg every morning and 20 mg nightly.  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  Avoid AV nodal blocking agents.  No beta-blockers or calcium channel blockers.  Diverticulosis: Noted on CT scan   DVT PPx: Heparin   Dispo: Anticipated discharge in approximately 1-2 day(s).   Valentino NoseBoswell, Nathan, MD 06/12/2017, 1:26 PM Pager: [phone]

## 2017-06-12 NOTE — Consult Note (Addendum)
Cardiology Consultation:   Patient ID: Joshua Dalton; [id number]; Jun 28, 1940   Admit date: 06/11/2017 Date of Consult: 06/12/2017  Primary Care Provider: Annita Brod, MD Primary Cardiologist: Dr Dulce Sellar Primary Electrophysiologist:  Dr Johney Frame   Patient Profile:   Joshua Dalton is a 77 y.o. male with a hx of CAD s/p remote PCI who is being seen today for pre operative clearanceat the request of [place].  History of Present Illness:   [name] is a 77 y/o male who previously lived in [place], but now lives in a retirement community here in [place]. We are asked to see him today for pre op cardiac clearance prior to cholecystectomy scheduled for tomorrow.    He has been seen in the past for arrhythmia. Dr Johney Frame saw the patient in May 2018 and Dr Anne Fu saw him in consult Oct 2018. Both consults were for nocturnal bradycardia with no indication for pacemaker. The patient then saw Dr Dulce Sellar in the office in Nov 2018. The pt has a history of OSA and uses C-pap. He also told me had a coronary stent put in 12 years ago here at [facility]. There is no record of this in Epic, and no mention of this in prior consult notes. He also told he has subsequently had nuclear stress testing in [place] but was unable to give me any details. He denies any chest and says he never used the NTG he was prescribed 12 yrs ago. Other medical problems include recurrent DVT, (pt is on chronic Eliquis), IDDM, CRI-3, dyslipidemia, gout, and BPH.    Past Medical History:  Diagnosis Date  . Anxiety   . Arthritis    "all over" (06/11/2017)  . Asthma   . Chronic diastolic (congestive) heart failure (HCC)    a. echo in 08/2016 showed a preserved EF of 60-65%, Grade 1 DD, and no WMA. Mild AI noted.   . Chronic kidney disease, stage III (moderate) (HCC)   . Coronary artery disease   . Depression   . DVT (deep venous thrombosis) (HCC) 2017   RLE  . GERD (gastroesophageal reflux  disease)   . Gout    "on daily RX"  (06/11/2017)  . Heart disease   . History of kidney stones   . Hypercholesteremia   . Hypertension   . Myocardial infarction (HCC)   . On home oxygen therapy    "2L; at night only" (06/11/2017)  . OSA on CPAP    "w/2L O2" (06/11/2017)  . Personal history of venous thrombosis and embolism   . Type II diabetes mellitus (HCC)     Past Surgical History:  Procedure Laterality Date  . CORONARY ANGIOPLASTY WITH STENT PLACEMENT    . CYSTOSCOPY     "[place]"  . CYSTOSCOPY W/ STONE MANIPULATION  2012  . ESOPHAGOGASTRODUODENOSCOPY (EGD) WITH ESOPHAGEAL DILATION    . ESOPHAGOGASTRODUODENOSCOPY (EGD) WITH PROPOFOL N/A 08/26/2016   Procedure: ESOPHAGOGASTRODUODENOSCOPY (EGD) WITH PROPOFOL;  Surgeon: Kathi Der, MD;  Location: [facility];  Service: Gastroenterology;  Laterality: N/A;  . JOINT REPLACEMENT    . TOTAL KNEE ARTHROPLASTY Right 2017     Home Medications:  Prior to Admission medications   Medication Sig Start Date End Date Taking? Authorizing Provider  allopurinol (ZYLOPRIM) 300 MG tablet Take 300 mg by mouth daily.   Yes [provider]  apixaban (ELIQUIS) 5 MG TABS tablet 10mg  BID till 02/04/17 then 5mg  BID from 02/05/17 onwards 01/30/17  Yes Glade Lloyd, MD  Artificial Saliva (BIOTENE MOISTURIZING  MOUTH MT) Use as directed 1 Dose in the mouth or throat as needed (dry mouth).   Yes [provider]  fenofibrate (TRICOR) 145 MG tablet Take 145 mg by mouth daily.   Yes [provider]  fexofenadine (ALLEGRA) 180 MG tablet Take 180 mg by mouth daily.   Yes [provider]  finasteride (PROSCAR) 5 MG tablet Take 5 mg by mouth daily.   Yes [provider]  FLUoxetine (PROZAC) 20 MG capsule Take 20 mg by mouth daily.   Yes [provider]  fluticasone (FLONASE) 50 MCG/ACT nasal spray Place 2 sprays into both nostrils daily.   Yes [provider]  furosemide (LASIX) 40 MG tablet  Take 40 mg 2 (two) times daily by mouth. Take 40 mg in am and 20 mg in evening   Yes [provider]  insulin aspart (NOVOLOG) 100 UNIT/ML injection Inject 10 Units into the skin 3 (three) times daily before meals. Patient taking differently: Inject 20 Units into the skin 3 (three) times daily before meals.  01/30/17  Yes Glade Lloyd, MD  insulin glargine (LANTUS) 100 UNIT/ML injection Inject 0.45 mLs (45 Units total) into the skin at bedtime. Patient taking differently: Inject 70 Units into the skin at bedtime.  01/30/17  Yes Glade Lloyd, MD  Misc Natural Products (TART CHERRY ADVANCED PO) Take 1 Dose by mouth as needed (arthritis).   Yes [provider]  oxybutynin (DITROPAN-XL) 10 MG 24 hr tablet Take 10 mg by mouth at bedtime.   Yes [provider]  pravastatin (PRAVACHOL) 80 MG tablet Take 80 mg by mouth daily.   Yes [provider]  tamsulosin (FLOMAX) 0.4 MG CAPS capsule Take 0.4 mg by mouth every morning.  08/20/16  Yes [provider]  furosemide (LASIX) 40 MG tablet TAKE 1 TABLET BY MOUTH IN THE MORNING AND 1/2 (ONE-HALF) IN THE EVENING 06/11/17   Baldo Daub, MD  pantoprazole (PROTONIX) 40 MG tablet Take 1 tablet (40 mg total) by mouth daily. Patient not taking: Reported on 06/11/2017 08/26/16 08/26/17  Clydia Llano, MD    Inpatient Medications: Scheduled Meds: . allopurinol  300 mg Oral Daily  . fenofibrate  160 mg Oral Daily  . finasteride  5 mg Oral Daily  . FLUoxetine  20 mg Oral Daily  . furosemide  40 mg Oral BID  . insulin aspart  0-9 Units Subcutaneous TID WC  . insulin aspart  6 Units Subcutaneous TID WC  . insulin glargine  30 Units Subcutaneous QHS  . morphine      . oxybutynin  10 mg Oral QHS  . pantoprazole  40 mg Oral Daily  . potassium chloride  40 mEq Oral Once  . pravastatin  80 mg Oral q1800  . sodium chloride flush  3 mL Intravenous Q12H  . tamsulosin  0.4 mg Oral q morning - 10a   Continuous  Infusions: . sodium chloride    . heparin 1,400 Units/hr (06/12/17 1057)  . piperacillin-tazobactam (ZOSYN)  IV 3.375 g (06/12/17 1401)   PRN Meds: sodium chloride, morphine injection, ondansetron **OR** ondansetron (ZOFRAN) IV, sodium chloride flush  Allergies:   No Known Allergies  Social History:   Social History   Socioeconomic History  . Marital status: Widowed    Spouse name: Not on file  . Number of children: Not on file  . Years of education: Not on file  . Highest education level: Not on file  Social Needs  . Physicist, medical  strain: Not on file  . Food insecurity - worry: Not on file  . Food insecurity - inability: Not on file  . Transportation needs - medical: Not on file  . Transportation needs - non-medical: Not on file  Occupational History  . Not on file  Tobacco Use  . Smoking status: Former Smoker    Packs/day: 1.00    Years: 1.00    Pack years: 1.00    Types: Cigarettes    Last attempt to quit: 1959    Years since quitting: 60.2  . Smokeless tobacco: Never Used  Substance and Sexual Activity  . Alcohol use: No  . Drug use: No  . Sexual activity: Not Currently  Other Topics Concern  . Not on file  Social History Narrative  . Not on file    Family History:    Family History  Problem Relation Age of Onset  . Hypertension Mother   . Heart disease Mother   . Heart attack Father        died younger than pt's current age     ROS:  Please see the history of present illness.  All other ROS reviewed and negative.     Physical Exam/Data:   Vitals:   06/11/17 2140 06/12/17 0529 06/12/17 1100 06/12/17 1319  BP:  117/73 140/74 105/62  Pulse: 75 (!) 57 64 73  Resp: 18  16 18   Temp:  97.8 F (36.6 C) 98.1 F (36.7 C) 98.2 F (36.8 C)  TempSrc:  Oral Oral Oral  SpO2: 97% 93% 97% 98%  Weight:      Height:        Intake/Output Summary (Last 24 hours) at 06/12/2017 1414 Last data filed at 06/12/2017 1300 Gross per 24 hour  Intake 187 ml   Output 1600 ml  Net -1413 ml   Filed Weights   06/11/17 1426  Weight: 224 lb 13.9 oz (102 kg)   Body mass index is 36.29 kg/m.  General:  Well nourished, well developed, in no acute distress HEENT: normal Lymph: no adenopathy Neck: no JVD Endocrine:  No thryomegaly Vascular: No carotid bruits; FA pulses 2+ bilaterally without bruits  Cardiac:  normal S1, S2; RRR; no murmur  Lungs:  clear to auscultation bilaterally, no wheezing, rhonchi or rales  Abd: soft, nontender, no hepatomegaly  Ext: no edema Musculoskeletal:  No deformities, BUE and BLE strength normal and equal Skin: warm and dry  Neuro:  CNs 2-12 intact, no focal abnormalities noted Psych:  Normal affect   EKG:  The EKG was personally reviewed and demonstrates:  NSR, SB, RBBB  Relevant CV Studies: Echo 01/27/17- Study Conclusions  - Left ventricle: The cavity size was normal. Wall thickness was   normal. Systolic function was normal. The estimated ejection   fraction was in the range of 60% to 65%. Doppler parameters are   consistent with abnormal left ventricular relaxation (grade 1   diastolic dysfunction). - Aortic valve: There was trivial regurgitation. - Right ventricle: The cavity size was mildly dilated. Wall   thickness was normal. - Impressions: Very limited images due to poor sound wave   transmission.  Impressions:  - Very limited images due to poor sound wave transmission.   Laboratory Data:  Chemistry Recent Labs  Lab 06/11/17 0434 06/11/17 0441 06/12/17 0622  NA 136 139 140  K 4.0 3.6 3.2*  CL 103 103 100*  CO2 19*  --  30  GLUCOSE 324* 318* 87  BUN 20 23* 13  CREATININE 2.02* 1.80* 1.92*  CALCIUM 9.6  --  9.0  GFRNONAA 30*  --  32*  GFRAA 35*  --  37*  ANIONGAP 14  --  10    Recent Labs  Lab 06/11/17 0434 06/12/17 0622  PROT 6.9 6.2*  ALBUMIN 3.6 3.1*  AST 32 15  ALT 19 13*  ALKPHOS 91 65  BILITOT 1.3* 1.3*   Hematology Recent Labs  Lab 06/11/17 0434  06/11/17 0441 06/12/17 0622  WBC 11.4*  --  9.8  RBC 5.04  --  4.71  HGB 16.5 17.0 15.3  HCT 48.1 50.0 45.7  MCV 95.4  --  97.0  MCH 32.7  --  32.5  MCHC 34.3  --  33.5  RDW 14.9  --  14.8  PLT 273  --  237   Cardiac EnzymesNo results for input(s): TROPONINI in the last 168 hours. No results for input(s): TROPIPOC in the last 168 hours.  BNPNo results for input(s): BNP, PROBNP in the last 168 hours.  DDimer No results for input(s): DDIMER in the last 168 hours.  Radiology/Studies:  Nm Hepato W/eject Fract  Result Date: 06/12/2017 CLINICAL DATA:  Right upper quadrant pain with nausea and vomiting EXAM: NUCLEAR MEDICINE HEPATOBILIARY IMAGING VIEWS: Anterior right upper abdomen RADIOPHARMACEUTICALS:  6.2 mCi Tc-39m  Choletec IV COMPARISON:  Ultrasound right upper quadrant June 11, 2017 FINDINGS: Initially, images were obtained over a 90 minutes time span after administration of 5.2 millicurie of technetium 61 M Choletec without visualization of the gallbladder. At that time, an additional 1.0 millicurie of technetium 46 M Choletec was administered as well as 3.0 mg of morphine intravenously. Images were obtained over no additional 30 minutes. Prompt uptake and biliary excretion of activity by the liver is seen. Small bowel activity is seen promptly indicating patency of the common bile duct. Gallbladder never visualizes despite additional radiotracer administration and intravenous morphine administration. IMPRESSION: Nonvisualization of gallbladder, a finding felt to be indicative cystic duct obstruction. Common bile duct is patent as is evidenced by visualization of small bowel. Liver uptake of radiotracer normal. Electronically Signed   By: Bretta Bang III M.D.   On: 06/12/2017 10:58   Ct Renal Stone Study  Result Date: 06/11/2017 CLINICAL DATA:  Acute onset of generalized abdominal pain, nausea and vomiting. EXAM: CT ABDOMEN AND PELVIS WITHOUT CONTRAST TECHNIQUE: Multidetector CT  imaging of the abdomen and pelvis was performed following the standard protocol without IV contrast. COMPARISON:  None. FINDINGS: Lower chest: Minimal bibasilar atelectasis is noted. Coronary artery calcification is seen. Trace pericardial fluid remains within normal limits. Hepatobiliary: The liver is unremarkable in appearance. Mild soft tissue inflammation is suggested about the gallbladder. Would correlate clinically for evidence of cholecystitis. The common bile duct remains normal in caliber. Pancreas: The pancreas is within normal limits. Spleen: The spleen is unremarkable in appearance. Adrenals/Urinary Tract: The adrenal glands are unremarkable in appearance. A right renal cyst is noted. Nonspecific perinephric stranding is noted bilaterally. Mild bilateral renal atrophy is noted. There is no evidence of hydronephrosis. No renal or ureteral stones are identified. Stomach/Bowel: The stomach is unremarkable in appearance. The small bowel is within normal limits. The appendix is normal in caliber, without evidence of appendicitis. Mild diverticulosis is noted along the descending and proximal sigmoid colon, without evidence of diverticulitis. Vascular/Lymphatic: Scattered calcification is seen along the abdominal aorta and its branches. The abdominal aorta is otherwise grossly unremarkable. The inferior vena cava is grossly unremarkable. No retroperitoneal lymphadenopathy is seen.  No pelvic sidewall lymphadenopathy is identified. Reproductive: The bladder is mildly distended and grossly unremarkable. The prostate is borderline normal in size. Other: No additional soft tissue abnormalities are seen. Musculoskeletal: No acute osseous abnormalities are identified. The visualized musculature is unremarkable in appearance. IMPRESSION: 1. Suggestion of mild soft tissue inflammation about the gallbladder. Would correlate clinically for evidence of cholecystitis. 2. Coronary artery calcifications seen. 3. Right renal  cyst noted.  Mild bilateral renal atrophy noted. 4. Mild diverticulosis along the descending and proximal sigmoid colon, without evidence of diverticulitis. Electronically Signed   By: Roanna Raider M.D.   On: 06/11/2017 06:37   US Abdomen Limited Ruq  Result Date: 06/11/2017 CLINICAL DATA:  Chronic right upper quadrant pain EXAM: ULTRASOUND ABDOMEN LIMITED RIGHT UPPER QUADRANT COMPARISON:  None. FINDINGS: Gallbladder: No gallstones are seen. Sludge is seen in the gallbladder. The gallbladder wall does appear thickened, and there is mild pericholecystic fluid. No sonographic Murphy sign noted by sonographer. Common bile duct: Diameter: 5 mm. No intrahepatic or extrahepatic biliary duct dilatation. Liver: No focal lesion identified. Liver echogenicity is increased diffusely. Portal vein is patent on color Doppler imaging with normal direction of blood flow towards the liver. IMPRESSION: 1. Gallbladder wall thickening with pericholecystic fluid. Mild sludge in gallbladder but no gallstones seen. Suspect acalculous cholecystitis. In this regard, it may be reasonable to correlate with nuclear medicine hepatobiliary imaging study to assess for cystic duct patency. 2. Diffuse increase in liver echogenicity, a finding most likely indicative of hepatic steatosis. While no focal liver lesions are evident on this study, it must be cautioned that the sensitivity of ultrasound for detection of focal liver lesions is diminished in this circumstance. Electronically Signed   By: Bretta Bang III M.D.   On: 06/11/2017 09:29    Assessment and Plan:   Pre op clearance- Based on patients history and exam he is an acceptable risk for surgery from as cardiovascular standpoint.  Acute cholecystitis Surgery scheduled for tomorrow  CAD Details unclear and no records in the chart about this but he apparently had a stent placed here 12 years ago. No angina since. He had normal LVF on echo Oct 2018.   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 Avoid any AV nodal blocking agents and place on telemetry post op. He will ask his son to bring in the Stent card he has at home. Eliquis is on hold, resume ASAP post op.     For questions or updates, please contact [facility] Please consult [email or web address] for contact info under Cardiology/STEMI.   Signed, Corine Shelter, PA-C  06/12/2017 2:14 PM    I have seen and examined the patient along with Corine Shelter, PA.  I have reviewed the chart, notes and new data.  I agree with PA's note.  Key new complaints: denies angina since "my stent when I was 77 years old, here at [facility]". I cannot find a record of that in Epic, but the cath lab image archive shows coronary angio and LCX stent in 2005, Dr. Tresa Endo. Had single vessel disease and normal LVEF at the time. He has not used NTG since then. No new coronary events. Key examination changes: normal CV exam, tender RUQ Key new findings / data:  creat 1.9. Recent echo with normal LVEF and wall motion.  PLAN: He has CAD, but has been asymptomatic for years. Functional status is difficult to assess due to sedentary lifestyle. Further cardiac workup at this juncture is not indicated, since it would not change the need for his abdominal surgery. Low-to-moderate risk for major CV complications. I am more concerned about the risk for recurrent venous thromboembolic events. Anticoagulation should be restarted as soon as surgically safe.  Thurmon Fair, MD, [facility] [phone] 06/12/2017, 3:49 PM

## 2017-06-12 NOTE — Progress Notes (Signed)
ANTICOAGULATION CONSULT NOTE  Pharmacy Consult for heparin Indication: DVT history  Heparin Dosing Weight: 86.4 kg  Labs: Recent Labs    06/11/17 0434 06/11/17 0441 06/11/17 1445 06/12/17 0622 06/12/17 0623 06/12/17 1635  HGB 16.5 17.0  --  15.3  --   --   HCT 48.1 50.0  --  45.7  --   --   PLT 273  --   --  237  --   --   APTT  --   --  28  --  61* 69*  HEPARINUNFRC  --   --  >2.20* 2.08*  --   --   CREATININE 2.02* 1.80*  --  1.92*  --   --     Assessment: 76 yom with acute cholecystis on apixaban PTA for hx of DVT/PE - no active VTE. Pharmacy consulted to start heparin when apixaban on hold if case of procedures - last dose on 3/11 at 1306. CBC wnl. Will follow aPTT while apixaban expected to influence heparin levels.  aPTT this afternoon is therapeutic at 69. No bleeding noted.  Goal of Therapy:  Heparin level 0.3-0.7 units/ml aPTT 66-102 seconds Monitor platelets by anticoagulation protocol: Yes   Plan:  Continue heparin at 1400 units/hr 8h aPTT, Daily heparin level/aPTT/CBC Monitor s/sx bleeding, f/u Surgery plans   Thank you for allowing us to participate in this patients care.  Signe Coltonya C Regie Bunner, PharmD Clinical phone for 06/12/2017 from 3:30-10:30p: x [phone] If after 10:30p, please call main pharmacy at: x [phone] 06/12/2017 5:32 PM

## 2017-06-12 NOTE — Progress Notes (Signed)
Inpatient Diabetes Program Recommendations  AACE/ADA: New Consensus Statement on Inpatient Glycemic Control (2015)  Target Ranges:  Prepandial:   less than 140 mg/dL      Peak postprandial:   less than 180 mg/dL (1-2 hours)      Critically ill patients:  140 - 180 mg/dL   Lab Results  Component Value Date   GLUCAP 87 06/12/2017   HGBA1C 9.3 (H) 01/28/2017    Review of Glycemic ControlResults for Isabella BowensHOMPSON, Kory WALTER (MRN [id number]) as of 06/12/2017 15:05  Ref. Range 06/12/2017 07:34 06/12/2017 11:34 06/12/2017 13:17  Glucose-Capillary Latest Ref Range: 65 - 99 mg/dL 84 69 87    Diabetes history: Type 2 DM Outpatient Diabetes medications: Novolog 10 units tid with meals, Lantus 70 units q HS Current orders for Inpatient glycemic control:  Lantus 30 units q HS, Novolog sensitive tid with meals, Novolog 6 units tid with meals Inpatient Diabetes Program Recommendations:    Please consider reducing Lantus to 20 units q HS.  Discussed with MD and orders received.   Thanks,  Beryl MeagerJenny Aster Eckrich, RN, BC-ADM Inpatient Diabetes Coordinator Pager [phone] (8a-5p)

## 2017-06-12 NOTE — Evaluation (Signed)
Physical Therapy Evaluation Patient Details Name: Joshua Dalton MRN: [id number] DOB: [birth year] Today's Date: 06/12/2017   History of Present Illness  Pt. is a 77 y.o. M with significant PMH of previous DVT's, PE's, CHF, AV block, DM, and ankle fracture who presents with acute cholecystitis.   Clinical Impression  Patient evaluated by Physical Therapy with no further acute PT needs identified. All education has been completed and the patient has no further questions. At the time of PT eval, patient is at baseline functioning based on his history. Pt states he uses RW intermittently at his ALF. PT recommended patient use RW consistently for all mobility for improved balance and safety. See below for any follow-up Physical Therapy or equipment needs. PT is signing off. Thank you for this referral.      Follow Up Recommendations Other (comment);No PT follow up(Patient states he already receives therapy at [facility])    Equipment Recommendations  None recommended by PT    Recommendations for Other Services       Precautions / Restrictions Precautions Precautions: Fall Restrictions Weight Bearing Restrictions: No      Mobility  Bed Mobility Overal bed mobility: Modified Independent             General bed mobility comments: Modified independent with supine to sit   Transfers Overall transfer level: Needs assistance Equipment used: Rolling walker (2 wheeled);None Transfers: Sit to/from Stand Sit to Stand: Supervision         General transfer comment: Patient performed sit to stand from edge of bed and BSC with supervision with and without use of RW. VC's provided for foot and hand placement.    Ambulation/Gait Ambulation/Gait assistance: Min guard Ambulation Distance (Feet): 150 Feet Assistive device: Rolling walker (2 wheeled);None Gait Pattern/deviations: Decreased stride length   Gait velocity interpretation: Below normal speed for age/gender General  Gait Details: Patient ambulated from bed to bathroom and was unsteady with a wide base of support, tending to "furniture walk." With a RW, patient with improved balance and gait speed.   Stairs            Wheelchair Mobility    Modified Rankin (Stroke Patients Only)       Balance Overall balance assessment: Needs assistance Sitting-balance support: No upper extremity supported;Feet supported Sitting balance-Leahy Scale: Good     Standing balance support: No upper extremity supported Standing balance-Leahy Scale: Fair                               Pertinent Vitals/Pain Pain Assessment: No/denies pain    Home Living Family/patient expects to be discharged to:: Other (Comment)                 Additional Comments: Lives at [facility]    Prior Function Level of Independence: Independent         Comments: Intermittent use of RW for mobility. Meals provided at dining room or can be brought to room if needed     Hand Dominance        Extremity/Trunk Assessment   Upper Extremity Assessment Upper Extremity Assessment: Overall WFL for tasks assessed    Lower Extremity Assessment Lower Extremity Assessment: Generalized weakness    Cervical / Trunk Assessment Cervical / Trunk Assessment: Normal  Communication   Communication: No difficulties  Cognition Arousal/Alertness: Awake/alert Behavior During Therapy: WFL for tasks assessed/performed Overall Cognitive Status: No family/caregiver present to determine baseline cognitive functioning  General Comments: Patient very set in his ways i.e. needed lots of encouragement to use RW for balance      General Comments General comments (skin integrity, edema, etc.): Patient instructed on activity recommendations.    Exercises     Assessment/Plan    PT Assessment Patent does not need any further PT services  PT Problem List         PT  Treatment Interventions      PT Goals (Current goals can be found in the Care Plan section)  Acute Rehab PT Goals Patient Stated Goal: None stated    Frequency     Barriers to discharge        Co-evaluation               AM-PAC PT "6 Clicks" Daily Activity  Outcome Measure Difficulty turning over in bed (including adjusting bedclothes, sheets and blankets)?: None Difficulty moving from lying on back to sitting on the side of the bed? : None Difficulty sitting down on and standing up from a chair with arms (e.g., wheelchair, bedside commode, etc,.)?: A Little Help needed moving to and from a bed to chair (including a wheelchair)?: A Little Help needed walking in hospital room?: A Little Help needed climbing 3-5 steps with a railing? : A Little 6 Click Score: 20    End of Session Equipment Utilized During Treatment: Gait belt Activity Tolerance: Patient tolerated treatment well Patient left: in chair;with chair alarm set;with call bell/phone within reach Nurse Communication: Mobility status PT Visit Diagnosis: Unsteadiness on feet (R26.81)    Time: [id number] PT Time Calculation (min) (ACUTE ONLY): 43 min   Charges:   PT Evaluation $PT Eval Moderate Complexity: 1 Mod PT Treatments $Gait Training: 8-22 mins $Therapeutic Activity: 8-22 mins   PT G Codes:        Laurina Bustlearoline Avangelina Flight, PT, DPT Acute Rehabilitation Services    Vanetta MuldersCarloine H Charli Halle 06/12/2017, 12:59 PM

## 2017-06-12 NOTE — Plan of Care (Signed)
  Progressing Health Behavior/Discharge Planning: Ability to manage health-related needs will improve 06/12/2017 1734 - Progressing by Anderson MaltaSerrano, Shera Laubach M, RN Clinical Measurements: Ability to maintain clinical measurements within normal limits will improve 06/12/2017 1734 - Progressing by Anderson MaltaSerrano, Haim Hansson M, RN

## 2017-06-13 ENCOUNTER — Inpatient Hospital Stay (HOSPITAL_COMMUNITY): Payer: Medicare Other | Admitting: Anesthesiology

## 2017-06-13 ENCOUNTER — Encounter (HOSPITAL_COMMUNITY): Payer: Self-pay | Admitting: *Deleted

## 2017-06-13 ENCOUNTER — Encounter (HOSPITAL_COMMUNITY)
Admission: EM | Disposition: A | Discharge status: Home / Self Care | Payer: Self-pay | Source: Home / Self Care | Attending: Oncology

## 2017-06-13 ENCOUNTER — Inpatient Hospital Stay (HOSPITAL_COMMUNITY): Payer: Medicare Other

## 2017-06-13 DIAGNOSIS — I872 Venous insufficiency (chronic) (peripheral): Secondary | ICD-10-CM

## 2017-06-13 HISTORY — PX: CHOLECYSTECTOMY: SHX55

## 2017-06-13 LAB — HEPARIN LEVEL (UNFRACTIONATED): HEPARIN UNFRACTIONATED: 1.16 [IU]/mL — AB (ref 0.30–0.70)

## 2017-06-13 LAB — BASIC METABOLIC PANEL
ANION GAP: 12 (ref 5–15)
BUN: 14 mg/dL (ref 6–20)
CALCIUM: 9.1 mg/dL (ref 8.9–10.3)
CO2: 28 mmol/L (ref 22–32)
CREATININE: 2.15 mg/dL — AB (ref 0.61–1.24)
Chloride: 99 mmol/L — ABNORMAL LOW (ref 101–111)
GFR calc Af Amer: 32 mL/min — ABNORMAL LOW (ref >60)
GFR, EST NON AFRICAN AMERICAN: 28 mL/min — AB (ref >60)
Glucose, Bld: 73 mg/dL (ref 65–99)
Potassium: 3.3 mmol/L — ABNORMAL LOW (ref 3.5–5.1)
SODIUM: 139 mmol/L (ref 135–145)

## 2017-06-13 LAB — CBC
HCT: 44.7 % (ref 39.0–52.0)
HEMOGLOBIN: 15.1 g/dL (ref 13.0–17.0)
MCH: 33 pg (ref 26.0–34.0)
MCHC: 33.8 g/dL (ref 30.0–36.0)
MCV: 97.8 fL (ref 78.0–100.0)
PLATELETS: 231 10*3/uL (ref 150–400)
RBC: 4.57 MIL/uL (ref 4.22–5.81)
RDW: 15.1 % (ref 11.5–15.5)
WBC: 7.8 10*3/uL (ref 4.0–10.5)

## 2017-06-13 LAB — GLUCOSE, CAPILLARY
GLUCOSE-CAPILLARY: 60 mg/dL — AB (ref 65–99)
GLUCOSE-CAPILLARY: 285 mg/dL — AB (ref 65–99)
Glucose-Capillary: 118 mg/dL — ABNORMAL HIGH (ref 65–99)
Glucose-Capillary: 82 mg/dL (ref 65–99)
Glucose-Capillary: 85 mg/dL (ref 65–99)
Glucose-Capillary: 162 mg/dL — ABNORMAL HIGH (ref 65–99)

## 2017-06-13 LAB — MAGNESIUM: MAGNESIUM: 1.9 mg/dL (ref 1.7–2.4)

## 2017-06-13 LAB — SURGICAL PCR SCREEN
MRSA, PCR: NEGATIVE
STAPHYLOCOCCUS AUREUS: POSITIVE — AB

## 2017-06-13 LAB — APTT: APTT: 43 s — AB (ref 24–36)

## 2017-06-13 SURGERY — LAPAROSCOPIC CHOLECYSTECTOMY WITH INTRAOPERATIVE CHOLANGIOGRAM
Anesthesia: General

## 2017-06-13 MED ORDER — CEFAZOLIN SODIUM-DEXTROSE 2-3 GM-%(50ML) IV SOLR
INTRAVENOUS | Status: DC | PRN
  Administered 2017-06-13: 2 g via INTRAVENOUS

## 2017-06-13 MED ORDER — ONDANSETRON HCL 4 MG/2ML IJ SOLN
INTRAMUSCULAR | Status: AC
  Filled 2017-06-13: qty 2

## 2017-06-13 MED ORDER — IOPAMIDOL (ISOVUE-300) INJECTION 61%
INTRAVENOUS | Status: AC
  Filled 2017-06-13: qty 50

## 2017-06-13 MED ORDER — OXYCODONE HCL 5 MG PO TABS: 5.0000 mg | ORAL_TABLET | ORAL | Status: DC | PRN

## 2017-06-13 MED ORDER — PHENYLEPHRINE HCL 10 MG/ML IJ SOLN
INTRAVENOUS | Status: DC | PRN
  Administered 2017-06-13: 50 ug/min via INTRAVENOUS

## 2017-06-13 MED ORDER — MUPIROCIN 2 % EX OINT
1.0000 "application " | TOPICAL_OINTMENT | Freq: Two times a day (BID) | CUTANEOUS | Status: DC
  Administered 2017-06-13 – 2017-06-14 (×3): 1 via NASAL
  Filled 2017-06-13 (×2): qty 22

## 2017-06-13 MED ORDER — ROCURONIUM BROMIDE 100 MG/10ML IV SOLN
INTRAVENOUS | Status: DC | PRN
  Administered 2017-06-13: 40 mg via INTRAVENOUS

## 2017-06-13 MED ORDER — HEPARIN (PORCINE) IN NACL 100-0.45 UNIT/ML-% IJ SOLN
1600.0000 [IU]/h | INTRAMUSCULAR | Status: DC
  Administered 2017-06-13: 1600 [IU]/h via INTRAVENOUS
  Filled 2017-06-13 (×2): qty 250

## 2017-06-13 MED ORDER — BUPIVACAINE-EPINEPHRINE 0.25% -1:200000 IJ SOLN
INTRAMUSCULAR | Status: DC | PRN
  Administered 2017-06-13: 16 mL

## 2017-06-13 MED ORDER — LACTATED RINGERS IV SOLN
INTRAVENOUS | Status: DC
Start: 2017-06-13 — End: 2017-06-13
  Administered 2017-06-13: 11:00:00 via INTRAVENOUS

## 2017-06-13 MED ORDER — SUCCINYLCHOLINE CHLORIDE 20 MG/ML IJ SOLN
INTRAMUSCULAR | Status: DC | PRN
  Administered 2017-06-13: 140 mg via INTRAVENOUS

## 2017-06-13 MED ORDER — HYDROMORPHONE HCL 1 MG/ML IJ SOLN: 0.2500 mg | INTRAMUSCULAR | Status: DC | PRN

## 2017-06-13 MED ORDER — PHENYLEPHRINE HCL 10 MG/ML IJ SOLN
INTRAMUSCULAR | Status: DC | PRN
  Administered 2017-06-13 (×2): 80 ug via INTRAVENOUS

## 2017-06-13 MED ORDER — SUGAMMADEX SODIUM 200 MG/2ML IV SOLN
INTRAVENOUS | Status: DC | PRN
  Administered 2017-06-13: 200 mg via INTRAVENOUS

## 2017-06-13 MED ORDER — SODIUM CHLORIDE 0.9 % IV SOLN
INTRAVENOUS | Status: DC | PRN
  Administered 2017-06-13: 7 mL

## 2017-06-13 MED ORDER — SODIUM CHLORIDE 0.9 % IR SOLN
Status: DC | PRN
  Administered 2017-06-13: 1000 mL

## 2017-06-13 MED ORDER — DEXTROSE 50 % IV SOLN
25.0000 mL | Freq: Once | INTRAVENOUS | Status: AC
  Administered 2017-06-13: 25 mL via INTRAVENOUS

## 2017-06-13 MED ORDER — ROCURONIUM BROMIDE 10 MG/ML (PF) SYRINGE
PREFILLED_SYRINGE | INTRAVENOUS | Status: AC
  Filled 2017-06-13: qty 5

## 2017-06-13 MED ORDER — PROPOFOL 10 MG/ML IV BOLUS
INTRAVENOUS | Status: DC | PRN
  Administered 2017-06-13: 170 mg via INTRAVENOUS

## 2017-06-13 MED ORDER — FENTANYL CITRATE (PF) 100 MCG/2ML IJ SOLN
INTRAMUSCULAR | Status: DC | PRN
  Administered 2017-06-13: 50 ug via INTRAVENOUS
  Administered 2017-06-13: 100 ug via INTRAVENOUS

## 2017-06-13 MED ORDER — DEXTROSE 50 % IV SOLN
INTRAVENOUS | Status: AC
  Filled 2017-06-13: qty 50

## 2017-06-13 MED ORDER — DEXAMETHASONE SODIUM PHOSPHATE 10 MG/ML IJ SOLN
INTRAMUSCULAR | Status: AC
  Filled 2017-06-13: qty 1

## 2017-06-13 MED ORDER — PROMETHAZINE HCL 25 MG/ML IJ SOLN: 6.2500 mg | INTRAMUSCULAR | Status: DC | PRN

## 2017-06-13 MED ORDER — CHLORHEXIDINE GLUCONATE CLOTH 2 % EX PADS
6.0000 | MEDICATED_PAD | Freq: Every day | CUTANEOUS | Status: DC
  Administered 2017-06-13: 6 via TOPICAL

## 2017-06-13 MED ORDER — PROPOFOL 10 MG/ML IV BOLUS
INTRAVENOUS | Status: AC
  Filled 2017-06-13: qty 20

## 2017-06-13 MED ORDER — DEXAMETHASONE SODIUM PHOSPHATE 10 MG/ML IJ SOLN
INTRAMUSCULAR | Status: DC | PRN
  Administered 2017-06-13: 10 mg via INTRAVENOUS

## 2017-06-13 MED ORDER — LIDOCAINE HCL (CARDIAC) 20 MG/ML IV SOLN
INTRAVENOUS | Status: DC | PRN
  Administered 2017-06-13: 70 mg via INTRAVENOUS

## 2017-06-13 MED ORDER — KCL IN DEXTROSE-NACL 20-5-0.9 MEQ/L-%-% IV SOLN
INTRAVENOUS | Status: AC
  Administered 2017-06-13: 09:00:00 via INTRAVENOUS
  Filled 2017-06-13 (×2): qty 1000

## 2017-06-13 MED ORDER — SUCCINYLCHOLINE CHLORIDE 20 MG/ML IJ SOLN
INTRAMUSCULAR | Status: AC
  Filled 2017-06-13: qty 1

## 2017-06-13 MED ORDER — FENTANYL CITRATE (PF) 250 MCG/5ML IJ SOLN
INTRAMUSCULAR | Status: AC
  Filled 2017-06-13: qty 5

## 2017-06-13 MED ORDER — SUGAMMADEX SODIUM 200 MG/2ML IV SOLN
INTRAVENOUS | Status: AC
  Filled 2017-06-13: qty 2

## 2017-06-13 MED ORDER — ACETAMINOPHEN 500 MG PO TABS
1000.0000 mg | ORAL_TABLET | Freq: Three times a day (TID) | ORAL | Status: DC
  Administered 2017-06-13 – 2017-06-14 (×2): 1000 mg via ORAL
  Filled 2017-06-13 (×2): qty 2

## 2017-06-13 MED ORDER — BUPIVACAINE-EPINEPHRINE (PF) 0.25% -1:200000 IJ SOLN
INTRAMUSCULAR | Status: AC
  Filled 2017-06-13: qty 30

## 2017-06-13 MED ORDER — LIDOCAINE HCL (CARDIAC) 20 MG/ML IV SOLN
INTRAVENOUS | Status: AC
  Filled 2017-06-13: qty 5

## 2017-06-13 SURGICAL SUPPLY — 45 items
APPLIER CLIP ROT 10 11.4 M/L (STAPLE) ×3
BENZOIN TINCTURE PRP APPL 2/3 (GAUZE/BANDAGES/DRESSINGS) ×3 IMPLANT
BLADE CLIPPER SURG (BLADE) IMPLANT
CANISTER SUCT 3000ML PPV (MISCELLANEOUS) ×3 IMPLANT
CHLORAPREP W/TINT 26ML (MISCELLANEOUS) ×3 IMPLANT
CLIP APPLIE ROT 10 11.4 M/L (STAPLE) ×1 IMPLANT
CLOSURE WOUND 1/2 X4 (GAUZE/BANDAGES/DRESSINGS) ×1
COVER MAYO STAND STRL (DRAPES) ×3 IMPLANT
COVER SURGICAL LIGHT HANDLE (MISCELLANEOUS) ×3 IMPLANT
DRAPE C-ARM 42X72 X-RAY (DRAPES) ×3 IMPLANT
DRSG TEGADERM 2-3/8X2-3/4 SM (GAUZE/BANDAGES/DRESSINGS) ×9 IMPLANT
DRSG TEGADERM 4X4.75 (GAUZE/BANDAGES/DRESSINGS) ×3 IMPLANT
ELECT REM PT RETURN 9FT ADLT (ELECTROSURGICAL) ×3
ELECTRODE REM PT RTRN 9FT ADLT (ELECTROSURGICAL) ×1 IMPLANT
FILTER SMOKE EVAC LAPAROSHD (FILTER) ×3 IMPLANT
GAUZE SPONGE 2X2 8PLY STRL LF (GAUZE/BANDAGES/DRESSINGS) ×1 IMPLANT
GLOVE BIO SURGEON STRL SZ7 (GLOVE) ×3 IMPLANT
GLOVE BIOGEL PI IND STRL 7.5 (GLOVE) ×1 IMPLANT
GLOVE BIOGEL PI INDICATOR 7.5 (GLOVE) ×2
GOWN STRL REUS W/ TWL LRG LVL3 (GOWN DISPOSABLE) ×3 IMPLANT
GOWN STRL REUS W/TWL LRG LVL3 (GOWN DISPOSABLE) ×6
HEMOSTAT SNOW SURGICEL 2X4 (HEMOSTASIS) ×3 IMPLANT
KIT BASIN OR (CUSTOM PROCEDURE TRAY) ×3 IMPLANT
KIT ROOM TURNOVER OR (KITS) ×3 IMPLANT
NS IRRIG 1000ML POUR BTL (IV SOLUTION) ×3 IMPLANT
PAD ARMBOARD 7.5X6 YLW CONV (MISCELLANEOUS) ×3 IMPLANT
POUCH RETRIEVAL ECOSAC 10 (ENDOMECHANICALS) ×1 IMPLANT
POUCH RETRIEVAL ECOSAC 10MM (ENDOMECHANICALS) ×2
POUCH SPECIMEN RETRIEVAL 10MM (ENDOMECHANICALS) ×3 IMPLANT
SCISSORS LAP 5X35 DISP (ENDOMECHANICALS) ×3 IMPLANT
SET CHOLANGIOGRAPH 5 50 .035 (SET/KITS/TRAYS/PACK) ×3 IMPLANT
SET IRRIG TUBING LAPAROSCOPIC (IRRIGATION / IRRIGATOR) ×3 IMPLANT
SLEEVE ENDOPATH XCEL 5M (ENDOMECHANICALS) ×3 IMPLANT
SPECIMEN JAR SMALL (MISCELLANEOUS) ×3 IMPLANT
SPONGE GAUZE 2X2 STER 10/PKG (GAUZE/BANDAGES/DRESSINGS) ×2
STRIP CLOSURE SKIN 1/2X4 (GAUZE/BANDAGES/DRESSINGS) ×2 IMPLANT
SUT MNCRL AB 4-0 PS2 18 (SUTURE) ×3 IMPLANT
TOWEL OR 17X24 6PK STRL BLUE (TOWEL DISPOSABLE) ×3 IMPLANT
TOWEL OR 17X26 10 PK STRL BLUE (TOWEL DISPOSABLE) ×3 IMPLANT
TRAY LAPAROSCOPIC MC (CUSTOM PROCEDURE TRAY) ×3 IMPLANT
TROCAR XCEL BLUNT TIP 100MML (ENDOMECHANICALS) ×3 IMPLANT
TROCAR XCEL NON-BLD 11X100MML (ENDOMECHANICALS) ×3 IMPLANT
TROCAR XCEL NON-BLD 5MMX100MML (ENDOMECHANICALS) ×3 IMPLANT
TUBING INSUFFLATION (TUBING) ×3 IMPLANT
WATER STERILE IRR 1000ML POUR (IV SOLUTION) ×3 IMPLANT

## 2017-06-13 NOTE — Progress Notes (Signed)
   Subjective/Chief Complaint: Still having some RUQ tenderness Heparin gtt off at 0830 this morning Cleared by cardiology Off Eliquis 48 hours   Objective: Vital signs in last 24 hours: Temp:  [97.7 F (36.5 C)-98.8 F (37.1 C)] 97.7 F (36.5 C) (03/13 0558) Pulse Rate:  [58-73] 58 (03/13 0558) Resp:  [16-18] 16 (03/13 0558) BP: (105-140)/(58-74) 116/60 (03/13 0558) SpO2:  [94 %-98 %] 97 % (03/13 0558) Weight:  [103.2 kg (227 lb 8.2 oz)] 103.2 kg (227 lb 8.2 oz) (03/13 0500) Last BM Date: 06/11/17  Intake/Output from previous day: 03/12 0701 - 03/13 0700 In: 939.5 [P.O.:420; I.V.:419.5; IV Piggyback:100] Out: 1875 [Urine:1875] Intake/Output this shift: No intake/output data recorded.  General appearance: alert, cooperative and no distress GI: obese, soft, tender in RUQ  Lab Results:  Recent Labs    06/12/17 0622 06/13/17 0503  WBC 9.8 7.8  HGB 15.3 15.1  HCT 45.7 44.7  PLT 237 231   BMET Recent Labs    06/12/17 0622 06/13/17 0503  NA 140 139  K 3.2* 3.3*  CL 100* 99*  CO2 30 28  GLUCOSE 87 73  BUN 13 14  CREATININE 1.92* 2.15*  CALCIUM 9.0 9.1   PT/INR No results for input(s): LABPROT, INR in the last 72 hours. ABG No results for input(s): PHART, HCO3 in the last 72 hours.  Invalid input(s): PCO2, PO2  Studies/Results: Nm Hepato W/eject Fract  Result Date: 06/12/2017 CLINICAL DATA:  Right upper quadrant pain with nausea and vomiting EXAM: NUCLEAR MEDICINE HEPATOBILIARY IMAGING VIEWS: Anterior right upper abdomen RADIOPHARMACEUTICALS:  6.2 mCi Tc-6455m  Choletec IV COMPARISON:  Ultrasound right upper quadrant June 11, 2017 FINDINGS: Initially, images were obtained over a 90 minutes time span after administration of 5.2 millicurie of technetium 6199 M Choletec without visualization of the gallbladder. At that time, an additional 1.0 millicurie of technetium 4199 M Choletec was administered as well as 3.0 mg of morphine intravenously. Images were obtained  over no additional 30 minutes. Prompt uptake and biliary excretion of activity by the liver is seen. Small bowel activity is seen promptly indicating patency of the common bile duct. Gallbladder never visualizes despite additional radiotracer administration and intravenous morphine administration. IMPRESSION: Nonvisualization of gallbladder, a finding felt to be indicative cystic duct obstruction. Common bile duct is patent as is evidenced by visualization of small bowel. Liver uptake of radiotracer normal. Electronically Signed   By: Bretta BangWilliam  Woodruff III M.D.   On: 06/12/2017 10:58    Anti-infectives: Anti-infectives (From admission, onward)   Start     Dose/Rate Route Frequency Ordered Stop   06/11/17 1400  piperacillin-tazobactam (ZOSYN) IVPB 3.375 g     3.375 g 12.5 mL/hr over 240 Minutes Intravenous Every 8 hours 06/11/17 0847     06/11/17 0700  piperacillin-tazobactam (ZOSYN) IVPB 3.375 g     3.375 g 100 mL/hr over 30 Minutes Intravenous  Once 06/11/17 0647 06/11/17 0732      Assessment/Plan: s/p Procedure(s): LAPAROSCOPIC CHOLECYSTECTOMY WITH INTRAOPERATIVE CHOLANGIOGRAM (N/A) Plan surgery later today - off heparin for four hours.   Wilmon ArmsMatthew K. Corliss Skainssuei, MD, [facility]  General/ Trauma Surgery  06/13/2017 10:12 AM   LOS: 2 days    Wynona LunaMatthew K Towana Stenglein 06/13/2017

## 2017-06-13 NOTE — Transfer of Care (Signed)
Immediate Anesthesia Transfer of Care Note  Patient: Isabella BowensRaymond Walter Havener  Procedure(s) Performed: LAPAROSCOPIC CHOLECYSTECTOMY WITH INTRAOPERATIVE CHOLANGIOGRAM (N/A )  Patient Location: PACU  Anesthesia Type:General  Level of Consciousness: awake, alert , oriented and patient cooperative  Airway & Oxygen Therapy: Patient Spontanous Breathing and Patient connected to nasal cannula oxygen  Post-op Assessment: Report given to RN, Post -op Vital signs reviewed and stable and Patient moving all extremities  Post vital signs: Reviewed and stable  Last Vitals:  Vitals:   06/12/17 2211 06/13/17 0558  BP: (!) 122/58 116/60  Pulse: 68 (!) 58  Resp: 17 16  Temp: 37.1 C 36.5 C  SpO2: 94% 97%    Last Pain:  Vitals:   06/13/17 0558  TempSrc: Oral  PainSc:          Complications: No apparent anesthesia complications

## 2017-06-13 NOTE — Progress Notes (Signed)
Medicine attending: I examined this patient today together with resident physician Dr. Lorenso CourierVahini Chundi and I concur with her evaluation and management plan which we discussed together. Persistent right upper quadrant discomfort.  Afebrile on Unasyn.  Lungs clear.  Regular cardiac rhythm.  Chronic venous stasis changes of the legs.  No calf tenderness. Potassium 3.3.  Creatinine 2.15.  White count 7800. He is on IV heparin.  This will be discontinued 6 hours preop. Plan is cholecystectomy later today. Resume heparin 6 hours postop without a loading dose.  If stable 24 hours postop, can resume his Eliquis.

## 2017-06-13 NOTE — Progress Notes (Addendum)
NPO post midnight maint. Consent for surgery signed by pt.  1035 pt to pre op.  1500 Received pt back from PACU, very sleepy, responsive. Replaced CPAP. Pt denies pain at this time.

## 2017-06-13 NOTE — Progress Notes (Signed)
   Subjective: Mr. Janee Mornhompson was seen laying in his bed this morning doing well. He stated that he still has some right uppper abdomen.   Objective:  Vital signs in last 24 hours: Vitals:   06/12/17 1319 06/12/17 2211 06/13/17 0500 06/13/17 0558  BP: 105/62 (!) 122/58  116/60  Pulse: 73 68  (!) 58  Resp: 18 17  16   Temp: 98.2 F (36.8 C) 98.8 F (37.1 C)  97.7 F (36.5 C)  TempSrc: Oral Oral  Oral  SpO2: 98% 94%  97%  Weight:   227 lb 8.2 oz (103.2 kg)   Height:       Physical Exam  Constitutional: He appears well-developed and well-nourished. No distress.  HENT:  Head: Normocephalic and atraumatic.  Eyes: Conjunctivae are normal.  Cardiovascular: Normal rate, regular rhythm and normal heart sounds.  Respiratory: Effort normal and breath sounds normal. No respiratory distress. He has no wheezes.  GI: Soft. Bowel sounds are normal. He exhibits no distension. There is tenderness (right upper quadrant).  Musculoskeletal: He exhibits no edema.  Neurological: No cranial nerve deficit.  Skin: He is not diaphoretic. No erythema.  Psychiatric: He has a normal mood and affect. His behavior is normal. Judgment and thought content normal.   Assessment/Plan:  Acalculous Acute Cholecystitis: HIDA scan from yesterday returned positive and therefore surgery scheduled patient for a lap cholecystecomy today 06/13/17. He has been cleared by cardiology for procedure.   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 Was previously on Pradaxa which was discontinued in October 2018. Subsequent to discontinuing pradaxa 2 weeks later the patient had a right lower extremity DVT and probable acute PE (VQ scan with intermediate probability). Therefore, he is on livelong eliquis.   -Holding eliqiuis and starting  heparin for procedure   Insulin-dependent diabetes mellitus type 2: The patient's blood glucose has ranged 60-135.  -Continue lantus 20 units qhs.  -Continue SSO-s.   Hypertension: Patient's blood pressure has ranged 105-140/62-74 over the past 24 hrs.   -continue to monitor  OSA: CPAP nightly  Heart failure with preserved ejection fracture:  -Continue his home Lasix dosing of 40 mg every morning and 20 mg nightly.  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 Avoid AV nodal blocking agents. No beta-blockers or calcium channel blockers.  Dispo: Anticipated discharge in approximately 1-2 day(s).   Lorenso Courierhundi, Sabrie Moritz, MD 06/13/2017, 8:37 AM Pager: [phone]

## 2017-06-13 NOTE — Anesthesia Preprocedure Evaluation (Addendum)
Anesthesia Evaluation  Patient identified by MRN, date of birth, ID band Patient awake  General Assessment Comment:Patient states he is 2 months status post pulmonary embolism.  Available records show an intermediate probability nuclear medicine scan with segmental defects in the right middle lobe done January 27, 2017.  Venous Doppler studies of his legs positive for acute DVT with extensive thrombus in the right proximal veins on January 26, 2017.  Reviewed: Allergy & Precautions, NPO status , Patient's Chart, lab work & pertinent test results  Airway Mallampati: III  TM Distance: <3 FB Neck ROM: Full    Dental no notable dental hx.    Pulmonary sleep apnea, Continuous Positive Airway Pressure Ventilation and Oxygen sleep apnea , former smoker, PE   Pulmonary exam normal breath sounds clear to auscultation       Cardiovascular hypertension, + CAD, + Past MI, + Cardiac Stents and + DVT   Rhythm:Regular Rate:Normal + Systolic murmurs    Neuro/Psych negative neurological ROS  negative psych ROS   GI/Hepatic negative GI ROS, Neg liver ROS,   Endo/Other  diabetes, Insulin Dependent  Renal/GU Renal InsufficiencyRenal disease  negative genitourinary   Musculoskeletal negative musculoskeletal ROS (+)   Abdominal   Peds negative pediatric ROS (+)  Hematology negative hematology ROS (+)   Anesthesia Other Findings   Reproductive/Obstetrics negative OB ROS                            Anesthesia Physical Anesthesia Plan  ASA: III  Anesthesia Plan: General   Post-op Pain Management:    Induction: Intravenous  PONV Risk Score and Plan: 2 and Ondansetron and Treatment may vary due to age or medical condition  Airway Management Planned: Oral ETT  Additional Equipment:   Intra-op Plan:   Post-operative Plan: Extubation in OR  Informed Consent: I have reviewed the patients History and Physical,  chart, labs and discussed the procedure including the risks, benefits and alternatives for the proposed anesthesia with the patient or authorized representative who has indicated his/her understanding and acceptance.   Dental advisory given  Plan Discussed with: CRNA and Surgeon  Anesthesia Plan Comments:         Anesthesia Quick Evaluation

## 2017-06-13 NOTE — Op Note (Signed)
Laparoscopic Cholecystectomy with IOC Procedure Note  Indications: This patient presents with acute cholecystitis, but was anticoagulated on Eliquis.  He was cleared by cardiology and is now 48 hours post-last dose of ELiquis.  He presents now for cholecystectomy.  Pre-operative Diagnosis: Calculus of gallbladder with acute cholecystitis, without mention of obstruction  Post-operative Diagnosis: Same  Surgeon: Wynona Luna   Assistants: none  Anesthesia: General endotracheal anesthesia  ASA Class: 3  Procedure Details  The patient was seen again in the Holding Room. The risks, benefits, complications, treatment options, and expected outcomes were discussed with the patient. The possibilities of reaction to medication, pulmonary aspiration, perforation of viscus, bleeding, recurrent infection, finding a normal gallbladder, the need for additional procedures, failure to diagnose a condition, the possible need to convert to an open procedure, and creating a complication requiring transfusion or operation were discussed with the patient. The likelihood of improving the patient's symptoms with return to their baseline status is good.  The patient and/or family concurred with the proposed plan, giving informed consent. The site of surgery properly noted. The patient was taken to Operating Room, identified as Joshua Dalton and the procedure verified as Laparoscopic Cholecystectomy with Intraoperative Cholangiogram. A Time Out was held and the above information confirmed.  Prior to the induction of general anesthesia, antibiotic prophylaxis was administered. General endotracheal anesthesia was then administered and tolerated well. After the induction, the abdomen was prepped with Chloraprep and draped in the sterile fashion. The patient was positioned in the supine position.  Local anesthetic agent was injected into the skin above the umbilicus and an incision made. We dissected down to the  abdominal fascia with blunt dissection.  The fascia was incised vertically and we entered the peritoneal cavity bluntly.  A pursestring suture of 0-Vicryl was placed around the fascial opening.  The Hasson cannula was inserted and secured with the stay suture.  Pneumoperitoneum was then created with CO2 and tolerated well without any adverse changes in the patient's vital signs. An 11-mm port was placed in the subxiphoid position.  Two 5-mm ports were placed in the right upper quadrant. All skin incisions were infiltrated with a local anesthetic agent before making the incision and placing the trocars.   We positioned the patient in reverse Trendelenburg, tilted slightly to the patient's left.  The gallbladder was identified, the fundus grasped and retracted cephalad. There were significant adhesions to the thickened, edematous gallbladder.  Adhesions were lysed bluntly and with the electrocautery where indicated, taking care not to injure any adjacent organs or viscus. The infundibulum was grasped and retracted laterally, exposing the peritoneum overlying the triangle of Calot. This was then divided and exposed in a blunt fashion. A critical view of the cystic duct and cystic artery was obtained.  The cystic duct was clearly identified and bluntly dissected circumferentially. The cystic duct was ligated with a clip distally.   An incision was made in the cystic duct and the [facility] cholangiogram catheter introduced. The catheter was secured using a clip. A cholangiogram was then obtained which showed good visualization of the distal and proximal biliary tree with no sign of filling defects or obstruction.  Contrast flowed easily into the duodenum. The catheter was then removed.   The cystic duct was then ligated with clips and divided. The cystic artery was identified, dissected free, ligated with clips and divided as well.   The gallbladder was dissected from the liver bed in retrograde fashion with the  electrocautery. The gallbladder  was removed and placed in an Endocatch sac. The liver bed was irrigated and inspected. Hemostasis was achieved with the electrocautery.  We packed surgicel SNOW into the gallbladder fossa since the patient will be restarted on heparin. Copious irrigation was utilized and was repeatedly aspirated until clear.  The gallbladder and Endocatch sac were then removed through the umbilical port site.  The pursestring suture was used to close the umbilical fascia.    We again inspected the right upper quadrant for hemostasis.  Pneumoperitoneum was released as we removed the trocars.  4-0 Monocryl was used to close the skin.   Benzoin, steri-strips, and clean dressings were applied. The patient was then extubated and brought to the recovery room in stable condition. Instrument, sponge, and needle counts were correct at closure and at the conclusion of the case.   Findings: Cholecystitis with Cholelithiasis  Estimated Blood Loss: Minimal         Drains: none         Specimens: Gallbladder           Complications: None; patient tolerated the procedure well.         Disposition: PACU - hemodynamically stable.         Condition: stable  Wilmon ArmsMatthew K. Corliss Skainssuei, MD, [facility]  General/ Trauma Surgery  06/13/2017 2:14 PM

## 2017-06-13 NOTE — Progress Notes (Signed)
Hypoglycemic Event  CBG: 60  Treatment: D50 IV 25 mL  Symptoms: None  Follow-up CBG: Time:0815 CBG Result:118  Possible Reasons for Event: Inadequate meal intake and Other: Pt is NPO  Comments/MD notified:Dr Chundi notified.    Krishan Mcbreen Durwin NoraM Scarlet Abad

## 2017-06-13 NOTE — Progress Notes (Signed)
ANTICOAGULATION CONSULT NOTE - Follow Up Consult  Pharmacy Consult for heparin Indication: h/o DVT  Labs: Recent Labs    06/11/17 0434 06/11/17 0441  06/11/17 1445 06/12/17 0622 06/12/17 0623 06/12/17 1635 06/13/17 0503  HGB 16.5 17.0  --   --  15.3  --   --  15.1  HCT 48.1 50.0  --   --  45.7  --   --  44.7  PLT 273  --   --   --  237  --   --  231  APTT  --   --    < > 28  --  61* 69* 43*  HEPARINUNFRC  --   --   --  >2.20* 2.08*  --   --  1.16*  CREATININE 2.02* 1.80*  --   --  1.92*  --   --  2.15*   < > = values in this interval not displayed.    Assessment: 77yo male subtherapeutic on heparin after one PTT at goal.  Goal of Therapy:  aPTT 66-102 seconds   Plan:  Will increase heparin gtt by 2 units/kg/hr to 1600 units/hr and check PTT in 8 hours.    Vernard GamblesVeronda Aurthur Wingerter, PharmD, BCPS  06/13/2017,6:59 AM

## 2017-06-13 NOTE — Progress Notes (Signed)
Pt. Placed himself on cpap.

## 2017-06-13 NOTE — Anesthesia Procedure Notes (Signed)
Procedure Name: Intubation Date/Time: 06/13/2017 12:52 PM Performed by: Izora Gala, CRNA Pre-anesthesia Checklist: Patient identified, Emergency Drugs available, Suction available and Patient being monitored Patient Re-evaluated:Patient Re-evaluated prior to induction Oxygen Delivery Method: Circle system utilized Preoxygenation: Pre-oxygenation with 100% oxygen Induction Type: IV induction Ventilation: Two handed mask ventilation required and Oral airway inserted - appropriate to patient size Laryngoscope Size: Sabra Heck, 3 and Glidescope (Miller 3 too short to raise epiglottis.  Perhaps Mac 4.  Grade 1 with Glidescope) Grade View: Grade III Tube type: Oral Tube size: 7.5 mm Airway Equipment and Method: Stylet and Video-laryngoscopy Placement Confirmation: ETT inserted through vocal cords under direct vision,  positive ETCO2 and breath sounds checked- equal and bilateral Secured at: 23 cm Tube secured with: Tape Dental Injury: Teeth and Oropharynx as per pre-operative assessment

## 2017-06-14 ENCOUNTER — Encounter (HOSPITAL_COMMUNITY): Payer: Self-pay | Admitting: Surgery

## 2017-06-14 DIAGNOSIS — Z9049 Acquired absence of other specified parts of digestive tract: Secondary | ICD-10-CM

## 2017-06-14 DIAGNOSIS — Z6836 Body mass index (BMI) 36.0-36.9, adult: Secondary | ICD-10-CM

## 2017-06-14 DIAGNOSIS — E662 Morbid (severe) obesity with alveolar hypoventilation: Secondary | ICD-10-CM

## 2017-06-14 LAB — CBC
HCT: 44.3 % (ref 39.0–52.0)
HEMOGLOBIN: 15.1 g/dL (ref 13.0–17.0)
MCH: 32.9 pg (ref 26.0–34.0)
MCHC: 34.1 g/dL (ref 30.0–36.0)
MCV: 96.5 fL (ref 78.0–100.0)
Platelets: 243 10*3/uL (ref 150–400)
RBC: 4.59 MIL/uL (ref 4.22–5.81)
RDW: 14.5 % (ref 11.5–15.5)
WBC: 12.5 10*3/uL — AB (ref 4.0–10.5)

## 2017-06-14 LAB — APTT: aPTT: 91 seconds — ABNORMAL HIGH (ref 24–36)

## 2017-06-14 LAB — HEPARIN LEVEL (UNFRACTIONATED): Heparin Unfractionated: 1.1 IU/mL — ABNORMAL HIGH (ref 0.30–0.70)

## 2017-06-14 LAB — GLUCOSE, CAPILLARY: GLUCOSE-CAPILLARY: 254 mg/dL — AB (ref 65–99)

## 2017-06-14 MED ORDER — APIXABAN 5 MG PO TABS
5.0000 mg | ORAL_TABLET | Freq: Two times a day (BID) | ORAL | Status: DC
  Administered 2017-06-14: 5 mg via ORAL
  Filled 2017-06-14: qty 1

## 2017-06-14 MED ORDER — APIXABAN 2.5 MG PO TABS: 2.5000 mg | ORAL_TABLET | Freq: Two times a day (BID) | ORAL | Status: DC

## 2017-06-14 NOTE — Discharge Summary (Signed)
Name: Joshua Dalton MRN: [id number] DOB: [birth year] 77 y.o. 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   apixaban 5 MG Tabs tablet Commonly known as:  ELIQUIS 10mg  BID till 02/04/17 then 5mg  BID from 02/05/17 onwards   BIOTENE MOISTURIZING MOUTH MT Use as directed 1 Dose in the mouth or throat as needed (dry mouth).   fenofibrate 145 MG tablet Commonly known as:  TRICOR Take 145 mg by mouth daily.   fexofenadine 180 MG tablet Commonly known as:  ALLEGRA Take 180 mg by mouth daily.   finasteride 5 MG tablet Commonly known as:  PROSCAR Take 5 mg by mouth daily.   FLUoxetine 20 MG capsule Commonly known as:  PROZAC Take 20 mg by mouth daily.   fluticasone 50 MCG/ACT nasal spray Commonly known as:  FLONASE Place 2 sprays into both nostrils daily.   furosemide 40 MG tablet Commonly known as:  LASIX Take 40 mg 2 (two) times daily by mouth. Take 40 mg in am and 20 mg in evening   insulin aspart 100 UNIT/ML injection Commonly known as:  novoLOG Inject 10 Units into the skin 3 (three) times daily before meals. What changed:  how much to take   insulin glargine 100 UNIT/ML injection Commonly known as:  LANTUS Inject 0.45 mLs (45 Units total) into the skin at  bedtime. What changed:  how much to take   oxybutynin 10 MG 24 hr tablet Commonly known as:  DITROPAN-XL Take 10 mg by mouth at bedtime.   pantoprazole 40 MG tablet Commonly known as:  PROTONIX Take 1 tablet (40 mg total) by mouth daily.   pravastatin 80 MG tablet Commonly known as:  PRAVACHOL Take 80 mg by mouth daily.   tamsulosin 0.4 MG Caps capsule Commonly known as:  FLOMAX Take 0.4 mg by mouth every morning.   TART CHERRY ADVANCED PO Take 1 Dose by mouth as needed (arthritis).       Disposition and follow-up:   [name] was discharged from [facility] in stable condition.  At the hospital follow up visit please address:  1.  Acute Cholecystitis: please make sure the patient does not have abdominal tenderness. Please make sure th patient's renal function does not further worsen. If the patient's crcl drops below 30 then may need to stop noac and consider warfarin.   2.  Labs / imaging needed at time of follow-up: bmp, cr cl  3.  Pending labs/ test needing follow-up: none  Follow-up Appointments: Follow-up Information    [facility], [facility]. Go on 06/26/2017.   Specialty:  General Surgery Why:  Your appointment is at 10:30 AM. Please arrive 30 min prior to appointment time. Bring photo ID and insurance information.  Contact information: [address] [place] [address] [phone]        Annita Brod, MD Follow up in 1 week(s).   Specialty:  Internal Medicine Contact information: [address] [place] [address] [phone]           Hospital Course by problem list:  Acute cholecystitis The patient presented with one day history of right upper quadrant pain and vomiting. The patient was afebrile on admission with mild leukocytosis of 11.4. Liver enzymes were fairly unremarkable on admission and tbili was 1.3. CT renal stone study showed mild soft tissue inflammation  around gallbladder. Right upper quadrant abdominal ultrasound showed sludge in gallbladder without stones. HIDA scan was positive for cystic duct obstruction and nonvisualization of gallbladder. General surgery did uncomplicated laparoscopic cholecystectomy post stopping eliquis. The patient was stable for discharge on day of discharge. The patient received perioperative antibiotics which were stopped after the procedure. He was able to have good po intake, no nausea, but no flatus yet. The patient was dicharged back on Eliquis.    Discharge Vitals:   BP (!) 105/52 (BP Location: Left Arm) Comment: nurse notified  Pulse 72   Temp 98.2 F (36.8 C) (Oral)   Resp 18   Ht 5\' 6"  (1.676 m)   Wt 228 lb 15.5 oz (103.9 kg)   SpO2 98%   BMI 36.96 kg/m   Pertinent Labs, Studies, and Procedures:  CBC Latest Ref Rng & Units 06/14/2017 06/13/2017 06/12/2017  WBC 4.0 - 10.5 K/uL 12.5(H) 7.8 9.8  Hemoglobin 13.0 - 17.0 g/dL 28.4 13.2 44.0  Hematocrit 39.0 - 52.0 % 44.3 44.7 45.7  Platelets 150 - 400 K/uL 243 231 237   BMP Latest Ref Rng & Units 06/13/2017 06/12/2017 06/11/2017  Glucose 65 - 99 mg/dL 73 87 102(V)  BUN 6 - 20 mg/dL 14 13 25(D)  Creatinine 0.61 - 1.24 mg/dL 6.64(Q) 0.34(V) 4.25(Z)  BUN/Creat Ratio 10 - 24 - - -  Sodium 135 - 145 mmol/L 139 140 139  Potassium 3.5 - 5.1 mmol/L 3.3(L) 3.2(L) 3.6  Chloride 101 - 111 mmol/L 99(L) 100(L) 103  CO2 22 - 32 mmol/L 28 30 -  Calcium 8.9 - 10.3 mg/dL 9.1 9.0 -   US Abdomen limited RUQ (06/11/17): 1. Gallbladder wall thickening with pericholecystic fluid. Mild sludge in gallbladder but no gallstones seen. Suspect acalculous cholecystitis. In this regard, it may be reasonable to correlate with nuclear medicine hepatobiliary imaging study to assess for cystic duct patency.  2. Diffuse increase in liver echogenicity, a finding most likely indicative of hepatic steatosis. While no focal liver lesions are evident on this study, it must be cautioned  that the sensitivity of ultrasound for detection of focal liver lesions is diminished in this circumstance.  CT Renal Stone (06/11/17): 1. Suggestion of mild soft tissue inflammation about the gallbladder. Would correlate clinically for evidence of cholecystitis. 2. Coronary artery calcifications seen. 3. Right renal cyst noted.  Mild bilateral renal atrophy noted. 4. Mild diverticulosis along the descending and proximal sigmoid colon, without evidence of diverticulitis.  NM Hepato W.Eject Fract (06/12/17): Nonvisualization of gallbladder, a finding felt to be indicative cystic duct obstruction. Common bile duct is patent as is evidenced by visualization of small bowel. Liver uptake of radiotracer normal.  DG Cholangiogram operative (06/13/17): No evidence of choledocholithiasis.  Discharge Instructions: Discharge Instructions    Call MD for:  extreme fatigue   Complete by:  As directed    Call MD for:  persistant dizziness or light-headedness   Complete by:  As directed    Call MD for:  persistant nausea and vomiting   Complete by:  As directed    Call MD for:  redness, tenderness, or signs of infection (pain, swelling, redness, odor or green/yellow discharge around incision site)   Complete by:  As directed    Call MD for:  severe uncontrolled pain   Complete by:  As directed    Call MD for:  temperature >100.4   Complete by:  As directed    Diet - low sodium heart healthy   Complete by:  As directed    Increase activity slowly   Complete by:  As directed       Signed: Lorenso Courierhundi, Bexleigh Theriault, MD 06/14/2017, 1:38 PM   Pager: [phone]

## 2017-06-14 NOTE — Anesthesia Postprocedure Evaluation (Signed)
Anesthesia Post Note  Patient: Joshua Dalton  Procedure(s) Performed: LAPAROSCOPIC CHOLECYSTECTOMY WITH INTRAOPERATIVE CHOLANGIOGRAM (N/A )     Patient location during evaluation: PACU Anesthesia Type: General Level of consciousness: awake and alert Pain management: pain level controlled Vital Signs Assessment: post-procedure vital signs reviewed and stable Respiratory status: spontaneous breathing, nonlabored ventilation, respiratory function stable and patient connected to nasal cannula oxygen Cardiovascular status: blood pressure returned to baseline and stable Postop Assessment: no apparent nausea or vomiting Anesthetic complications: no    Last Vitals:  Vitals:   06/14/17 0217 06/14/17 0527  BP: 114/64 119/64  Pulse: 82 62  Resp: 18 18  Temp: 36.7 C 36.8 C  SpO2: 98% 97%    Last Pain:  Vitals:   06/14/17 0217  TempSrc: Oral  PainSc:                  Lariya Kinzie S

## 2017-06-14 NOTE — Progress Notes (Signed)
[facility] Progress Note  1 Day Post-Op  Subjective: CC: pain in right shoulder Patient slightly tender in right abdomen but greatest source of discomfort is R shoulder. Patient sitting up at bedside and eating breakfast. Tolerating diet, no nausea. No flatus.  UOP good. VSS.   Objective: Vital signs in last 24 hours: Temp:  [97.7 F (36.5 C)-98.4 F (36.9 C)] 98.2 F (36.8 C) (03/14 0527) Pulse Rate:  [62-94] 62 (03/14 0527) Resp:  [12-18] 18 (03/14 0527) BP: (114-129)/(53-70) 119/64 (03/14 0527) SpO2:  [94 %-98 %] 97 % (03/14 0527) Weight:  [103 kg (227 lb)-103.9 kg (228 lb 15.5 oz)] 103.9 kg (228 lb 15.5 oz) (03/14 0527) Last BM Date: 06/11/17  Intake/Output from previous day: 03/13 0701 - 03/14 0700 In: 1870 [P.O.:120; I.V.:1600; IV Piggyback:150] Out: 1125 [Urine:1125] Intake/Output this shift: No intake/output data recorded.  PE: Gen:  Alert, NAD, pleasant Card:  Regular rate and rhythm, pedal pulses 2+ BL Pulm:  Normal effort, clear to auscultation bilaterally Abd: Soft, appropriately tender, non-distended, bowel sounds present, no HSM, surgical dressings C/D/I Skin: warm and dry, no rashes  Psych: A&Ox3   Lab Results:  Recent Labs    06/13/17 0503 06/14/17 0614  WBC 7.8 12.5*  HGB 15.1 15.1  HCT 44.7 44.3  PLT 231 243   BMET Recent Labs    06/12/17 0622 06/13/17 0503  NA 140 139  K 3.2* 3.3*  CL 100* 99*  CO2 30 28  GLUCOSE 87 73  BUN 13 14  CREATININE 1.92* 2.15*  CALCIUM 9.0 9.1   PT/INR No results for input(s): LABPROT, INR in the last 72 hours. CMP     Component Value Date/Time   NA 139 06/13/2017 0503   NA 141 02/06/2017 0000   K 3.3 (L) 06/13/2017 0503   CL 99 (L) 06/13/2017 0503   CO2 28 06/13/2017 0503   GLUCOSE 73 06/13/2017 0503   BUN 14 06/13/2017 0503   BUN 25 02/06/2017 0000   CREATININE 2.15 (H) 06/13/2017 0503   CALCIUM 9.1 06/13/2017 0503   PROT 6.2 (L) 06/12/2017 0622   ALBUMIN 3.1 (L) 06/12/2017 0622    AST 15 06/12/2017 0622   ALT 13 (L) 06/12/2017 0622   ALKPHOS 65 06/12/2017 0622   BILITOT 1.3 (H) 06/12/2017 0622   GFRNONAA 28 (L) 06/13/2017 0503   GFRAA 32 (L) 06/13/2017 0503   Lipase     Component Value Date/Time   LIPASE 27 06/11/2017 0434       Studies/Results: Dg Cholangiogram Operative  Result Date: 06/13/2017 CLINICAL DATA:  Intraoperative cholangiogram during laparoscopic cholecystectomy. EXAM: INTRAOPERATIVE CHOLANGIOGRAM FLUOROSCOPY TIME:  10 seconds COMPARISON:  Nuclear medicine HIDA scan-06/12/2017; right upper quadrant abdominal ultrasound-06/11/2017; CT abdomen and pelvis-06/11/2017 FINDINGS: A single spot intraoperative cholangiographic image of the right upper abdominal quadrant during laparoscopic cholecystectomy are provided for review. Surgical clips overlie the expected location of the gallbladder fossa. Contrast injection demonstrates selective cannulation of the central aspect of the cystic duct. There is passage of contrast through the central aspect of the cystic duct with filling of a non dilated common bile duct. There is passage of contrast though the CBD and into the descending portion of the duodenum. There is minimal reflux of injected contrast into the common hepatic duct and central aspect of the non dilated intrahepatic biliary system. There are no discrete filling defects within the opacified portions of the biliary system to suggest the presence of choledocholithiasis. IMPRESSION: No evidence of choledocholithiasis. Electronically Signed  By: Simonne Come M.D.   On: 06/13/2017 14:19   Nm Hepato W/eject Fract  Result Date: 06/12/2017 CLINICAL DATA:  Right upper quadrant pain with nausea and vomiting EXAM: NUCLEAR MEDICINE HEPATOBILIARY IMAGING VIEWS: Anterior right upper abdomen RADIOPHARMACEUTICALS:  6.2 mCi Tc-3m  Choletec IV COMPARISON:  Ultrasound right upper quadrant June 11, 2017 FINDINGS: Initially, images were obtained over a 90 minutes time  span after administration of 5.2 millicurie of technetium 76 M Choletec without visualization of the gallbladder. At that time, an additional 1.0 millicurie of technetium 76 M Choletec was administered as well as 3.0 mg of morphine intravenously. Images were obtained over no additional 30 minutes. Prompt uptake and biliary excretion of activity by the liver is seen. Small bowel activity is seen promptly indicating patency of the common bile duct. Gallbladder never visualizes despite additional radiotracer administration and intravenous morphine administration. IMPRESSION: Nonvisualization of gallbladder, a finding felt to be indicative cystic duct obstruction. Common bile duct is patent as is evidenced by visualization of small bowel. Liver uptake of radiotracer normal. Electronically Signed   By: Bretta Bang III M.D.   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 Corliss Skains - POD#1 - Hgb stable - ok to resume eliquis today  - tolerating diet - mobilize as tolerated  FEN:CM diet RUE:AVWU, heparin gtt - ok to restart eliquis ID:IV Zosyn 3/11>>  Plan: [facility] for discharge from a surgical standpoint. Follow up in chart. Pain currently controlled with tylenol.     LOS: 3 days    Wells Guiles , [facility] 06/14/2017, 8:33 AM Pager: [phone] Consults:  [phone] Mon-Fri 7:00 am-4:30 pm Sat-Sun 7:00 am-11:30 am

## 2017-06-14 NOTE — Progress Notes (Signed)
Medicine attending discharge note: I personally examined this patient on the day of discharge and I attest to the accuracy of the discharge evaluation and plan as recorded in the final progress note by resident physician Dr. Lorenso CourierVahini Chundi and which will be further documented in her discharge summary.  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  He is currently on Eliquis 5 mg twice daily.  He presented on the day of admission March 11 with a 24-hour history of right upper quadrant pain and a single episode of vomiting.  He was afebrile.  White count 11,400.  Borderline elevation of bilirubin at 1.3 with normal transaminases and alkaline phosphatase.  He was tender in the right upper quadrant.  Ultrasound showed some thickening of the gallbladder wall, sludge in the gallbladder but no stones, and mild pericholecystic fluid.  Negative Murphy sign at time of exam.  He was seen in consultation by general surgery.  Nuclear medicine gallbladder scan was done.  There was nonvisualization of the gallbladder consistent with cystic duct obstruction.  Common bile duct was patent. Oral anticoagulant discontinued.  He was put on unfractionated heparin.  He underwent uncomplicated laparoscopic cholecystectomy on March 13. He was temporarily put on perioperative antibiotics.  He remained afebrile for the duration of the hospital course.  White count had normalized at time of surgery.  Nonspecific elevation consistent with inflammatory changes from surgery on day of discharge with white count 12,500. Heparin was discontinued and he was put back on his Eliquis.  Disposition: Condition stable at time  of discharge He will follow-up with general surgery and his primary care physician There were no complications Pay attention to his renal function after discharge.  Creatinine 2.2.  Estimated GFR 28 mL/min.  Please note if there is any further deterioration in his renal function, Eliquis should be discontinued and he should be put on Coumadin.  All patients with creatinine clearance is less than 30 were excluded from the clinical trials of the oral Xa inhibitors.

## 2017-06-14 NOTE — Progress Notes (Signed)
ANTICOAGULATION CONSULT NOTE - Follow Up Consult  Pharmacy Consult for heparin Indication: h/o DVT  Labs: Recent Labs    06/11/17 1445  06/12/17 0622  06/12/17 1635 06/13/17 0503 06/14/17 0614  HGB  --    < > 15.3  --   --  15.1 15.1  HCT  --   --  45.7  --   --  44.7 44.3  PLT  --   --  237  --   --  231 243  APTT 28  --   --    < > 69* 43* 91*  HEPARINUNFRC >2.20*  --  2.08*  --   --  1.16*  --   CREATININE  --   --  1.92*  --   --  2.15*  --    < > = values in this interval not displayed.    Assessment/Plan:  77yo male therapeutic on heparin after resumed post-op. Will continue gtt at current rate and confirm stable with additional PTT.   Joshua GamblesVeronda Alandra Dalton, PharmD, BCPS  06/14/2017,7:24 AM

## 2017-06-14 NOTE — Discharge Instructions (Signed)
It was a pleasure to take care of you Mr. Joshua Dalton. During your hospitalization you had a cholecystectomy done. Please continue to take all of your medication. Please also follow your primary care provider and surgeon in 1-2 weeks.    Please arrive at least 30 min before your appointment to complete your check in paperwork.  If you are unable to arrive 30 min prior to your appointment time we may have to cancel or reschedule you.  LAPAROSCOPIC SURGERY: POST OP INSTRUCTIONS  1. DIET: Follow a light bland diet the first 24 hours after arrival home, such as soup, liquids, crackers, etc. Be sure to include lots of fluids daily. Avoid fast food or heavy meals as your are more likely to get nauseated. Eat a low fat the next few days after surgery.  2. Take your usually prescribed home medications unless otherwise directed. 3. PAIN CONTROL:  1. Pain is best controlled by a usual combination of three different methods TOGETHER:  1. Ice/Heat 2. Over the counter pain medication 3. Prescription pain medication 2. Most patients will experience some swelling and bruising around the incisions. Ice packs or heating pads (30-60 minutes up to 6 times a day) will help. Use ice for the first few days to help decrease swelling and bruising, then switch to heat to help relax tight/sore spots and speed recovery. Some people prefer to use ice alone, heat alone, alternating between ice & heat. Experiment to what works for you. Swelling and bruising can take several weeks to resolve.  3. It is helpful to take an over-the-counter pain medication regularly for the first few weeks. Choose one of the following that works best for you:  1. Naproxen (Aleve, etc) Two 220mg  tabs twice a day 2. Ibuprofen (Advil, etc) Three 200mg  tabs four times a day (every meal & bedtime) 3. Acetaminophen (Tylenol, etc) 500-650mg  four times a day (every meal & bedtime) 4. A prescription for pain medication (such as oxycodone, hydrocodone, etc)  should be given to you upon discharge. Take your pain medication as prescribed.  1. If you are having problems/concerns with the prescription medicine (does not control pain, nausea, vomiting, rash, itching, etc), please call us [phone] to see if we need to switch you to a different pain medicine that will work better for you and/or control your side effect better. 2. If you need a refill on your pain medication, please contact your pharmacy. They will contact our office to request authorization. Prescriptions will not be filled after 5 pm or on week-ends. 4. Avoid getting constipated. Between the surgery and the pain medications, it is common to experience some constipation. Increasing fluid intake and taking a fiber supplement (such as Metamucil, Citrucel, FiberCon, MiraLax, etc) 1-2 times a day regularly will usually help prevent this problem from occurring. A mild laxative (prune juice, Milk of Magnesia, MiraLax, etc) should be taken according to package directions if there are no bowel movements after 48 hours.  5. Watch out for diarrhea. If you have many loose bowel movements, simplify your diet to bland foods & liquids for a few days. Stop any stool softeners and decrease your fiber supplement. Switching to mild anti-diarrheal medications (Kayopectate, Pepto Bismol) can help. If this worsens or does not improve, please call us. 6. Wash / shower every day. You may shower over the dressings as they are waterproof. Continue to shower over incision(s) after the dressing is off. 7. Remove your waterproof bandages 5 days after surgery. You may leave the  incision open to air. You may replace a dressing/Band-Aid to cover the incision for comfort if you wish.  8. ACTIVITIES as tolerated:  1. You may resume regular (light) daily activities beginning the next day--such as daily self-care, walking, climbing stairs--gradually increasing activities as tolerated. If you can walk 30 minutes without difficulty,  it is safe to try more intense activity such as jogging, treadmill, bicycling, low-impact aerobics, swimming, etc. 2. Save the most intensive and strenuous activity for last such as sit-ups, heavy lifting, contact sports, etc Refrain from any heavy lifting or straining until you are off narcotics for pain control.  3. DO NOT PUSH THROUGH PAIN. Let pain be your guide: If it hurts to do something, don't do it. Pain is your body warning you to avoid that activity for another week until the pain goes down. 4. You may drive when you are no longer taking prescription pain medication, you can comfortably wear a seatbelt, and you can safely maneuver your car and apply brakes. 5. You may have sexual intercourse when it is comfortable.  9. FOLLOW UP in our office  1. Please call [facility] at [phone] to set up an appointment to see your surgeon in the office for a follow-up appointment approximately 2-3 weeks after your surgery. 2. Make sure that you call for this appointment the day you arrive home to insure a convenient appointment time.      10. IF YOU HAVE DISABILITY OR FAMILY LEAVE FORMS, BRING THEM TO THE               OFFICE FOR PROCESSING.   WHEN TO CALL us [phone]:  1. Poor pain control 2. Reactions / problems with new medications (rash/itching, nausea, etc)  3. Fever over 101.5 F (38.5 C) 4. Inability to urinate 5. Nausea and/or vomiting 6. Worsening swelling or bruising 7. Continued bleeding from incision. 8. Increased pain, redness, or drainage from the incision  The clinic staff is available to answer your questions during regular business hours (8:30am-5pm). Please dont hesitate to call and ask to speak to one of our nurses for clinical concerns.  If you have a medical emergency, go to the nearest emergency room or call 911.  A surgeon from [facility] is always on call at the [facility], [place]  [address], [place],  [place] [address] ?  MAIN: [phone] ? TOLL FREE: [phone] ?  FAX [phone]  [email or web address]

## 2017-06-14 NOTE — Progress Notes (Addendum)
   Subjective: Joshua Dalton was seen laying in his bed this morning comfortably. He states that he does not have any abdominal pain and is doing well.   Objective:  Vital signs in last 24 hours: Vitals:   06/13/17 2139 06/14/17 0217 06/14/17 0527 06/14/17 1046  BP: 129/70 114/64 119/64 (!) 105/52  Pulse: 94 82 62 72  Resp: 18 18 18 18   Temp: 98 F (36.7 C) 98 F (36.7 C) 98.2 F (36.8 C) 98.2 F (36.8 C)  TempSrc: Oral Oral  Oral  SpO2: 96% 98% 97% 98%  Weight:   228 lb 15.5 oz (103.9 kg)   Height:       Physical Exam  Constitutional: He appears well-developed and well-nourished. No distress.  HENT:  Head: Normocephalic and atraumatic.  Eyes: Conjunctivae are normal.  Cardiovascular: Normal rate, regular rhythm and normal heart sounds.  Respiratory: Effort normal and breath sounds normal. No respiratory distress. He has no wheezes.  GI: Soft. Bowel sounds are normal. He exhibits no distension. There is no tenderness.  Musculoskeletal: He exhibits no edema.  Neurological: He is alert.  Skin: He is not diaphoretic. No erythema.  Psychiatric: He has a normal mood and affect. His behavior is normal. Judgment and thought content normal.   Assessment/Plan:  Acalculous Acute Cholecystitis The patient is Day 1 post laparoscopic cholecystectomy. He is doing well, has had breakfast. He has not had bowel movement yet.  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 Was previously on Pradaxa which was discontinued in October 2018. Subsequent to discontinuing pradaxa 2 weeks later the patient had a right lower extremity DVT and probable acute PE (VQ scan with intermediate probability). Therefore, he is on livelong eliquis.   -stopped heparin  -Started Eliquis 5 bid due to the patient being 77 y.o and his crcl  being 32.5.  Insulin-dependent diabetes mellitus type 2: The patient's blood glucose has ranged 254-285.  -Continue lantus 20 units qhs.  -Continue SSO-s.  Hypertension: Patient's blood pressure is well controlled ranging 114-129/52-70 over the past 24 hrs.   -continue to monitor  OSA: CPAP nightly  Heart failure with preserved ejection fracture:  -Continue his home Lasix dosing of 40 mg every morning and 20 mg nightly.  Dispo: Anticipated discharge today.   Joshua Dalton, Joshua Prowse, MD 06/14/2017, 11:32 AM Pager: [phone]

## 2017-06-14 NOTE — Progress Notes (Signed)
Reviewed discharge instructions with patient and family at bedside. No questions noted. Removed IV from right hand with gauze and tape placed. Patient is alert and oriented with VS stable for discharge.

## 2017-06-17 LAB — GLUCOSE, CAPILLARY: GLUCOSE-CAPILLARY: 230 mg/dL — AB (ref 65–99)

## 2017-06-19 ENCOUNTER — Encounter: Payer: Self-pay | Admitting: Podiatry

## 2017-06-19 ENCOUNTER — Ambulatory Visit (INDEPENDENT_AMBULATORY_CARE_PROVIDER_SITE_OTHER): Payer: Medicare Other | Admitting: Podiatry

## 2017-06-19 DIAGNOSIS — E119 Type 2 diabetes mellitus without complications: Secondary | ICD-10-CM

## 2017-06-19 DIAGNOSIS — B351 Tinea unguium: Secondary | ICD-10-CM

## 2017-06-19 DIAGNOSIS — M21619 Bunion of unspecified foot: Secondary | ICD-10-CM

## 2017-06-19 DIAGNOSIS — M79672 Pain in left foot: Secondary | ICD-10-CM | POA: Diagnosis not present

## 2017-06-19 DIAGNOSIS — Z794 Long term (current) use of insulin: Secondary | ICD-10-CM

## 2017-06-19 DIAGNOSIS — M79671 Pain in right foot: Secondary | ICD-10-CM | POA: Diagnosis not present

## 2017-06-19 NOTE — Patient Instructions (Signed)
Seen for hypertrophic nails. All nails debrided. Both feet measured for diabetic shoes. Return in 3 months or as needed.  

## 2017-06-19 NOTE — Progress Notes (Signed)
Subjective: 77 y.o. year old male patient presents complaining of painful nails and request for diabetic shoes.   Just got out of hospital after Gall bladder surgery. Stated that his blood glucose stays at 130 level. Type II diabetic, IDDM, renal insufficiency stage 3. History of CHF, DVT on right lower limb. On anticoagulant therapy.   Objective: Dermatologic: Thick yellow deformed nails bilateral. Vascular: Pedal pulses are all palpable. Orthopedic: hallux valgus with bunion deformity. Neurologic: All epicritic and tactile sensations grossly intact.  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 IDDM. Painful nails.  Treatment: All mycotic nails debrided.  Both feet measured for diabetic shoes. Return in 3 months or as needed.

## 2017-07-03 ENCOUNTER — Telehealth: Payer: Self-pay | Admitting: *Deleted

## 2017-07-03 NOTE — Telephone Encounter (Signed)
Received diabetic shoes today. Attempted to call patient. No answer and no voicmail.

## 2017-07-26 NOTE — Telephone Encounter (Signed)
Pt notified shoes are ready for pickup.

## 2017-09-18 ENCOUNTER — Ambulatory Visit: Payer: Medicare Other | Admitting: Podiatry

## 2017-11-08 ENCOUNTER — Encounter (HOSPITAL_COMMUNITY): Payer: Self-pay | Admitting: Emergency Medicine

## 2017-11-08 ENCOUNTER — Emergency Department (HOSPITAL_COMMUNITY): Payer: Medicare Other

## 2017-11-08 ENCOUNTER — Emergency Department (HOSPITAL_COMMUNITY)
Admission: EM | Admit: 2017-11-08 | Discharge: 2017-11-08 | Disposition: A | Discharge status: Home / Self Care | Payer: Medicare Other | Attending: Emergency Medicine | Admitting: Emergency Medicine

## 2017-11-08 ENCOUNTER — Other Ambulatory Visit: Payer: Self-pay

## 2017-11-08 DIAGNOSIS — Z96651 Presence of right artificial knee joint: Secondary | ICD-10-CM | POA: Diagnosis not present

## 2017-11-08 DIAGNOSIS — I130 Hypertensive heart and chronic kidney disease with heart failure and stage 1 through stage 4 chronic kidney disease, or unspecified chronic kidney disease: Secondary | ICD-10-CM | POA: Insufficient documentation

## 2017-11-08 DIAGNOSIS — Z955 Presence of coronary angioplasty implant and graft: Secondary | ICD-10-CM | POA: Diagnosis not present

## 2017-11-08 DIAGNOSIS — I2510 Atherosclerotic heart disease of native coronary artery without angina pectoris: Secondary | ICD-10-CM | POA: Insufficient documentation

## 2017-11-08 DIAGNOSIS — N183 Chronic kidney disease, stage 3 (moderate): Secondary | ICD-10-CM | POA: Diagnosis not present

## 2017-11-08 DIAGNOSIS — Z7901 Long term (current) use of anticoagulants: Secondary | ICD-10-CM | POA: Diagnosis not present

## 2017-11-08 DIAGNOSIS — J45909 Unspecified asthma, uncomplicated: Secondary | ICD-10-CM | POA: Insufficient documentation

## 2017-11-08 DIAGNOSIS — F329 Major depressive disorder, single episode, unspecified: Secondary | ICD-10-CM | POA: Insufficient documentation

## 2017-11-08 DIAGNOSIS — R531 Weakness: Secondary | ICD-10-CM | POA: Insufficient documentation

## 2017-11-08 DIAGNOSIS — E1122 Type 2 diabetes mellitus with diabetic chronic kidney disease: Secondary | ICD-10-CM | POA: Insufficient documentation

## 2017-11-08 DIAGNOSIS — Z79899 Other long term (current) drug therapy: Secondary | ICD-10-CM | POA: Diagnosis not present

## 2017-11-08 DIAGNOSIS — Z87891 Personal history of nicotine dependence: Secondary | ICD-10-CM | POA: Diagnosis not present

## 2017-11-08 DIAGNOSIS — I5032 Chronic diastolic (congestive) heart failure: Secondary | ICD-10-CM | POA: Insufficient documentation

## 2017-11-08 DIAGNOSIS — I252 Old myocardial infarction: Secondary | ICD-10-CM | POA: Diagnosis not present

## 2017-11-08 DIAGNOSIS — F419 Anxiety disorder, unspecified: Secondary | ICD-10-CM | POA: Diagnosis not present

## 2017-11-08 DIAGNOSIS — Z794 Long term (current) use of insulin: Secondary | ICD-10-CM | POA: Diagnosis not present

## 2017-11-08 DIAGNOSIS — Z9049 Acquired absence of other specified parts of digestive tract: Secondary | ICD-10-CM | POA: Diagnosis not present

## 2017-11-08 LAB — URINALYSIS, ROUTINE W REFLEX MICROSCOPIC
BILIRUBIN URINE: NEGATIVE
HGB URINE DIPSTICK: NEGATIVE
Ketones, ur: NEGATIVE mg/dL
NITRITE: NEGATIVE
PROTEIN: 100 mg/dL — AB
Specific Gravity, Urine: 1.016 (ref 1.005–1.030)
pH: 6 (ref 5.0–8.0)

## 2017-11-08 LAB — COMPREHENSIVE METABOLIC PANEL
ALT: 22 U/L (ref 0–44)
AST: 31 U/L (ref 15–41)
Albumin: 3.3 g/dL — ABNORMAL LOW (ref 3.5–5.0)
Alkaline Phosphatase: 89 U/L (ref 38–126)
Anion gap: 12 (ref 5–15)
BUN: 19 mg/dL (ref 8–23)
CHLORIDE: 102 mmol/L (ref 98–111)
CO2: 22 mmol/L (ref 22–32)
CREATININE: 2.01 mg/dL — AB (ref 0.61–1.24)
Calcium: 9 mg/dL (ref 8.9–10.3)
GFR calc non Af Amer: 30 mL/min — ABNORMAL LOW (ref >60)
GFR, EST AFRICAN AMERICAN: 35 mL/min — AB (ref >60)
Glucose, Bld: 210 mg/dL — ABNORMAL HIGH (ref 70–99)
POTASSIUM: 3.3 mmol/L — AB (ref 3.5–5.1)
SODIUM: 136 mmol/L (ref 135–145)
Total Bilirubin: 1 mg/dL (ref 0.3–1.2)
Total Protein: 6.2 g/dL — ABNORMAL LOW (ref 6.5–8.1)

## 2017-11-08 LAB — CBC WITH DIFFERENTIAL/PLATELET
ABS IMMATURE GRANULOCYTES: 0.1 10*3/uL (ref 0.0–0.1)
Basophils Absolute: 0.1 10*3/uL (ref 0.0–0.1)
Basophils Relative: 1 %
EOS ABS: 0.1 10*3/uL (ref 0.0–0.7)
Eosinophils Relative: 1 %
HEMATOCRIT: 47.5 % (ref 39.0–52.0)
HEMOGLOBIN: 16.6 g/dL (ref 13.0–17.0)
IMMATURE GRANULOCYTES: 1 %
LYMPHS ABS: 1.4 10*3/uL (ref 0.7–4.0)
LYMPHS PCT: 9 %
MCH: 32.7 pg (ref 26.0–34.0)
MCHC: 34.9 g/dL (ref 30.0–36.0)
MCV: 93.7 fL (ref 78.0–100.0)
Monocytes Absolute: 0.7 10*3/uL (ref 0.1–1.0)
Monocytes Relative: 5 %
NEUTROS PCT: 85 %
Neutro Abs: 13.1 10*3/uL — ABNORMAL HIGH (ref 1.7–7.7)
Platelets: 233 10*3/uL (ref 150–400)
RBC: 5.07 MIL/uL (ref 4.22–5.81)
RDW: 13 % (ref 11.5–15.5)
WBC: 15.4 10*3/uL — ABNORMAL HIGH (ref 4.0–10.5)

## 2017-11-08 LAB — I-STAT TROPONIN, ED
TROPONIN I, POC: 0.01 ng/mL (ref 0.00–0.08)
Troponin i, poc: 0.01 ng/mL (ref 0.00–0.08)

## 2017-11-08 LAB — I-STAT CG4 LACTIC ACID, ED
LACTIC ACID, VENOUS: 1.93 mmol/L — AB (ref 0.5–1.9)
Lactic Acid, Venous: 1.34 mmol/L (ref 0.5–1.9)

## 2017-11-08 MED ORDER — SODIUM CHLORIDE 0.9 % IV BOLUS
1000.0000 mL | Freq: Once | INTRAVENOUS | Status: AC
  Administered 2017-11-08: 1000 mL via INTRAVENOUS

## 2017-11-08 NOTE — ED Provider Notes (Signed)
[place] [employer] [place] "Felt bad", weak, couldn't move Better after rest Elevated heart rate to 130's and temp of 103 Rectal temp 100.1  Needs to ambulate  Patient ambulates without difficulty. He continues to feel at his baseline. VSS without recurrent tachycardia. Delta trop is negative. He can be discharged home per plan of previous treatment team.      Elpidio AnisUpstill, Amaliya Whitelaw, PA-C 11/08/17 2344    Melene PlanFloyd, Dan, DO 11/09/17 1206

## 2017-11-08 NOTE — ED Notes (Signed)
Patient transported to XR. 

## 2017-11-08 NOTE — Discharge Instructions (Addendum)
All of your labs and x-ray are essentially normal.

## 2017-11-08 NOTE — ED Provider Notes (Addendum)
[facility] EMERGENCY DEPARTMENT Provider Note   CSN: [id number] Arrival date & time: 11/08/17  1847     History   Chief Complaint Chief Complaint  Patient presents with  . Heat Exposure    HPI Joshua Dalton is a 77 y.o. male.  77 yo M with a chief complaint of weakness.  The patient states that he drove to a [employer] and bought some things for his house and then drove back.  When he got out of the car and tried to walk to load his golf cart he suddenly felt weak.  Felt like he was having trouble keeping himself up.  He held onto the edge of the golf cart and was able to keep himself from falling.  Someone saw him like that and came and provided assistance.  They called 911.  EMS had a temperature of 103.  Patient denies any fevers or chills.  Denies cough or congestion denies vomiting or diarrhea.  Denies abdominal pain.  He recanted and said that he had had a episode of diarrhea last night but that it had resolved.  Denied bloody or dark stool.  The history is provided by the patient.  Illness  This is a new problem. The current episode started less than 1 hour ago. The problem occurs constantly. The problem has not changed since onset.Pertinent negatives include no chest pain, no abdominal pain, no headaches and no shortness of breath. Nothing aggravates the symptoms. Nothing relieves the symptoms. He has tried nothing for the symptoms. The treatment provided no relief.    Past Medical History:  Diagnosis Date  . Anxiety   . Arthritis    "all over" (06/11/2017)  . Asthma   . Chronic diastolic (congestive) heart failure (HCC)    a. echo in 08/2016 showed a preserved EF of 60-65%, Grade 1 DD, and no WMA. Mild AI noted.   . Chronic kidney disease, stage III (moderate) (HCC)   . Coronary artery disease   . Depression   . DVT (deep venous thrombosis) (HCC) 2017   RLE  . GERD (gastroesophageal reflux disease)   . Gout    "on daily RX"  (06/11/2017)  .  History of kidney stones   . Hypercholesteremia   . Hypertension   . Myocardial infarction (HCC)   . On home oxygen therapy    "2L; at night only" (06/11/2017)  . OSA on CPAP    "w/2L O2" (06/11/2017)  . Personal history of venous thrombosis and embolism   . Type II diabetes mellitus [facility])     Patient Active Problem List   Diagnosis Date Noted  . Acute cholecystitis   . Chronic anticoagulation   . History of pulmonary embolus (PE)   . History of nephrolithiasis   . Pulmonary embolism (HCC) 01/27/2017  . SOB (shortness of breath) 01/26/2017  . Acute DVT (deep venous thrombosis) (HCC) 01/26/2017  . Chronic diastolic CHF (congestive heart failure) (HCC) 01/26/2017  . Chronic respiratory failure with hypoxia (HCC) 09/21/2016  . HLD (hyperlipidemia) 09/19/2016  . Obstructive sleep apnea 09/03/2016  . 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 Dysphagia 09/03/2016  . History of deep venous thrombosis (DVT) of distal vein of right lower extremity 09/03/2016  . Dyspnea 08/20/2016  . Insulin dependent type 2 diabetes mellitus, controlled (HCC) 08/20/2016  . HTN (hypertension), benign 08/20/2016  . Chronic renal insufficiency, stage 3 (moderate) (HCC) 08/20/2016    Past Surgical History:  Procedure Laterality Date  .  CHOLECYSTECTOMY N/A 06/13/2017   Procedure: LAPAROSCOPIC CHOLECYSTECTOMY WITH INTRAOPERATIVE CHOLANGIOGRAM;  Surgeon: Manus Rudd, MD;  Location: [facility] OR;  Service: General;  Laterality: N/A;  . CORONARY ANGIOPLASTY WITH STENT PLACEMENT    . CYSTOSCOPY     "[place]"  . CYSTOSCOPY W/ STONE MANIPULATION  2012  . ESOPHAGOGASTRODUODENOSCOPY (EGD) WITH ESOPHAGEAL DILATION    . ESOPHAGOGASTRODUODENOSCOPY (EGD) WITH PROPOFOL N/A 08/26/2016   Procedure: ESOPHAGOGASTRODUODENOSCOPY (EGD) WITH PROPOFOL;  Surgeon: Kathi Der, MD;  Location: [facility] ENDOSCOPY;  Service: Gastroenterology;  Laterality: N/A;  . JOINT REPLACEMENT    . TOTAL KNEE ARTHROPLASTY  Right 2017        Home Medications    Prior to Admission medications   Medication Sig Start Date End Date Taking? Authorizing Provider  allopurinol (ZYLOPRIM) 300 MG tablet Take 300 mg by mouth daily.    [provider]  apixaban (ELIQUIS) 5 MG TABS tablet 10mg  BID till 02/04/17 then 5mg  BID from 02/05/17 onwards 01/30/17   Glade Lloyd, MD  Artificial Saliva (BIOTENE MOISTURIZING MOUTH MT) Use as directed 1 Dose in the mouth or throat as needed (dry mouth).    [provider]  fenofibrate (TRICOR) 145 MG tablet Take 145 mg by mouth daily.    [provider]  fexofenadine (ALLEGRA) 180 MG tablet Take 180 mg by mouth daily.    [provider]  finasteride (PROSCAR) 5 MG tablet Take 5 mg by mouth daily.    [provider]  FLUoxetine (PROZAC) 20 MG capsule Take 20 mg by mouth daily.    [provider]  fluticasone (FLONASE) 50 MCG/ACT nasal spray Place 2 sprays into both nostrils daily.    [provider]  furosemide (LASIX) 40 MG tablet Take 40 mg 2 (two) times daily by mouth. Take 40 mg in am and 20 mg in evening    [provider]  insulin aspart (NOVOLOG) 100 UNIT/ML injection Inject 10 Units into the skin 3 (three) times daily before meals. Patient taking differently: Inject 20 Units into the skin 3 (three) times daily before meals.  01/30/17   Glade Lloyd, MD  insulin glargine (LANTUS) 100 UNIT/ML injection Inject 0.45 mLs (45 Units total) into the skin at bedtime. Patient taking differently: Inject 70 Units into the skin at bedtime.  01/30/17   Glade Lloyd, MD  Misc Natural Products (TART CHERRY ADVANCED PO) Take 1 Dose by mouth as needed (arthritis).    [provider]  oxybutynin (DITROPAN-XL) 10 MG 24 hr tablet Take 10 mg by mouth at bedtime.    [provider]  pantoprazole (PROTONIX) 40 MG tablet Take 1 tablet (40 mg total) by mouth daily. Patient not taking: Reported on 06/11/2017  08/26/16 08/26/17  Clydia Llano, MD  pravastatin (PRAVACHOL) 80 MG tablet Take 80 mg by mouth daily.    [provider]  tamsulosin (FLOMAX) 0.4 MG CAPS capsule Take 0.4 mg by mouth every morning.  08/20/16   [provider]    Family History Family History  Problem Relation Age of Onset  . Hypertension Mother   . Heart disease Mother   . Heart attack Father        died younger than pt's current age    Social History Social History   Tobacco Use  . Smoking status: Former Smoker    Packs/day: 1.00    Years: 1.00    Pack years: 1.00    Types: Cigarettes    Last attempt to quit: 1959  Years since quitting: 60.6  . Smokeless tobacco: Never Used  Substance Use Topics  . Alcohol use: No  . Drug use: No     Allergies   Patient has no known allergies.   Review of Systems Review of Systems  Constitutional: Negative for chills and fever.  HENT: Negative for congestion and facial swelling.   Eyes: Negative for discharge and visual disturbance.  Respiratory: Negative for shortness of breath.   Cardiovascular: Negative for chest pain and palpitations.  Gastrointestinal: Negative for abdominal pain, diarrhea and vomiting.  Musculoskeletal: Negative for arthralgias and myalgias.  Skin: Negative for color change and rash.  Neurological: Positive for weakness. Negative for tremors, syncope and headaches.  Psychiatric/Behavioral: Negative for confusion and dysphoric mood.     Physical Exam Updated Vital Signs BP (!) 115/55   Pulse 78   Temp 100.1 F (37.8 C) (Rectal)   Resp 20   Ht 5\' 6"  (1.676 m)   Wt 98.4 kg   SpO2 93%   BMI 35.02 kg/m   Physical Exam  Constitutional: He is oriented to person, place, and time. He appears well-developed and well-nourished.  Obese  HENT:  Head: Normocephalic and atraumatic.  Eyes: Pupils are equal, round, and reactive to light. EOM are normal.  Neck: Normal range of motion. Neck supple. No JVD present.    Cardiovascular: Normal rate and regular rhythm. Exam reveals no gallop and no friction rub.  No murmur heard. Pulmonary/Chest: No respiratory distress. He has no wheezes.  Abdominal: He exhibits no distension and no mass. There is no tenderness. There is no rebound and no guarding.  Musculoskeletal: Normal range of motion.  Neurological: He is alert and oriented to person, place, and time.  Skin: No rash noted. No pallor.  Psychiatric: He has a normal mood and affect. His behavior is normal.  Nursing note and vitals reviewed.    ED Treatments / Results  Labs (all labs ordered are listed, but only abnormal results are displayed) Labs Reviewed  COMPREHENSIVE METABOLIC PANEL - Abnormal; Notable for the following components:      Result Value   Potassium 3.3 (*)    Glucose, Bld 210 (*)    Creatinine, Ser 2.01 (*)    Total Protein 6.2 (*)    Albumin 3.3 (*)    GFR calc non Af Amer 30 (*)    GFR calc Af Amer 35 (*)    All other components within normal limits  CBC WITH DIFFERENTIAL/PLATELET - Abnormal; Notable for the following components:   WBC 15.4 (*)    Neutro Abs 13.1 (*)    All other components within normal limits  I-STAT CG4 LACTIC ACID, ED - Abnormal; Notable for the following components:   Lactic Acid, Venous 1.93 (*)    All other components within normal limits  CULTURE, BLOOD (ROUTINE X 2)  CULTURE, BLOOD (ROUTINE X 2)  URINE CULTURE  URINALYSIS, ROUTINE W REFLEX MICROSCOPIC  I-STAT TROPONIN, ED  I-STAT CG4 LACTIC ACID, ED    EKG EKG Interpretation  Date/Time:  Thursday November 08 2017 18:59:08 EDT Ventricular Rate:  100 PR Interval:    QRS Duration: 126 QT Interval:  406 QTC Calculation: 524 R Axis:   57 Text Interpretation:  Junctional tachycardia Right bundle branch block Prolonged QT likely 1rst degree av block Otherwise no significant change Confirmed by Melene PlanFloyd, Jeryl Umholtz [phone]([id number]) on 11/08/2017 7:14:54 PM   Radiology Dg Chest 2 View  Result Date:  11/08/2017 CLINICAL DATA:  Pt coming from [facility] via [employer]. Pt went to [employer] and then drove back to facility where he was unloading his groceries and got very dizzy. A neighbor saw him stumbling while he was walking inside. EXAM: CHEST - 2 VIEW COMPARISON:  01/26/2017 FINDINGS: Heart size is normal. There is atherosclerotic calcification of the thoracic aorta. Stable elevation of the RIGHT hemidiaphragm. No pulmonary edema. No focal consolidations. Degenerative changes are seen in thoracic spine. IMPRESSION: No evidence for acute cardiopulmonary abnormality. Aortic atherosclerosis. (ICD10-I70.0) Electronically Signed   By: Norva Pavlov M.D.   On: 11/08/2017 20:43    Procedures Procedures (including critical care time)  Medications Ordered in ED Medications  sodium chloride 0.9 % bolus 1,000 mL (0 mLs Intravenous Stopped 11/08/17 2131)     Initial Impression / Assessment and Plan / ED Course  I have reviewed the triage vital signs and the nursing notes.  Pertinent labs & imaging results that were available during my care of the patient were reviewed by me and considered in my medical decision making (see chart for details).     77  Yo M with a chief complaint of weakness.  This seemed to have come and gone.  I wonder if this was a near syncopal event.  EMS was concerned that the patient had heatstroke. HR in the 130's, febrile to 103.  Patient is not febrile here initially, he is also well-appearing and nontoxic.  He also did not have a prolonged time in a hot environment.  He does feel warm to me on physical exam.  Will obtain a rectal temperature.  Check a chest x-ray and a urine. Give IV fluids.   Patient continues to be asymptomatic.  Will ambulate, get ua.  CXR without focal infiltrate, mild leukocytosis.  Cr at baseline.  Trop negative. Will delta.    If these are unremarkable and patient can ambulate and wants to go home will have follow up closely  with PCP.  Ambulated without difficulty. Discussed with Elpidio Anis, please see her note for further details of care.   The patients results and plan were reviewed and discussed.   Any x-rays performed were independently reviewed by myself.   Differential diagnosis were considered with the presenting HPI.  Medications  sodium chloride 0.9 % bolus 1,000 mL (0 mLs Intravenous Stopped 11/08/17 2131)    Vitals:   11/08/17 2045 11/08/17 2055 11/08/17 2115 11/08/17 2130  BP: (!) 106/59  115/62 (!) 115/55  Pulse: 87  77 78  Resp: 17  15 20   Temp:  100.1 F (37.8 C)    TempSrc:  Rectal    SpO2: 98%  94% 93%  Weight:      Height:        Final diagnoses:  Weakness        Final Clinical Impressions(s) / ED Diagnoses   Final diagnoses:  Weakness    ED Discharge Orders    None       Melene Plan, DO 11/08/17 2216    Melene Plan, DO 11/08/17 2220

## 2017-11-08 NOTE — ED Notes (Signed)
Pt reports feeling back to his normal self. He denies any complaints.

## 2017-11-08 NOTE — ED Notes (Addendum)
Pt ambulated to bathroom with no complaints of SOB or dizziness.  MD Adela LankFloyd notified.

## 2017-11-08 NOTE — ED Triage Notes (Signed)
[facility] coming from [facility] via [facility]. Pt went to [employer] and then drove back to facility where he was unloading his groceries and got very dizzy. A neighbor saw him stumbling while he was walking inside. Pt denies LOC. When EMS arrived pt could follow commands but was very weak and could not get many words out. Pt was very hot to the touch but not diaphoretic. HR 130's, RR 32, CBG 211, bp 144/75. Pt was given a 1 L bolus of NS. HR then came down to 95, RR 20. Spo2 93% 2L [place]. Pt uses cpap at night but machine has been broken for 2 weeks. Pt had endoscopy on Tuesday for trouble swallowing.

## 2017-11-10 LAB — URINE CULTURE: Culture: 10000 — AB

## 2017-11-11 ENCOUNTER — Telehealth: Payer: Self-pay

## 2017-11-11 NOTE — Telephone Encounter (Signed)
No treatment for UC ED 11/08/17 per Emily Shrosbree PAC 

## 2017-11-13 LAB — CULTURE, BLOOD (ROUTINE X 2)
CULTURE: NO GROWTH
CULTURE: NO GROWTH
SPECIAL REQUESTS: ADEQUATE
Special Requests: ADEQUATE

## 2017-11-29 ENCOUNTER — Emergency Department (HOSPITAL_COMMUNITY): Payer: Medicare Other

## 2017-11-29 ENCOUNTER — Encounter (HOSPITAL_COMMUNITY): Payer: Self-pay | Admitting: Emergency Medicine

## 2017-11-29 ENCOUNTER — Inpatient Hospital Stay (HOSPITAL_COMMUNITY)
Admission: EM | Admit: 2017-11-29 | Discharge: 2017-12-02 | DRG: 638 | Disposition: A | Discharge status: Home Health | Payer: Medicare Other | Attending: Internal Medicine | Admitting: Internal Medicine

## 2017-11-29 DIAGNOSIS — Z86718 Personal history of other venous thrombosis and embolism: Secondary | ICD-10-CM | POA: Diagnosis not present

## 2017-11-29 DIAGNOSIS — R6520 Severe sepsis without septic shock: Secondary | ICD-10-CM | POA: Diagnosis not present

## 2017-11-29 DIAGNOSIS — F329 Major depressive disorder, single episode, unspecified: Secondary | ICD-10-CM | POA: Diagnosis present

## 2017-11-29 DIAGNOSIS — R1310 Dysphagia, unspecified: Secondary | ICD-10-CM | POA: Diagnosis not present

## 2017-11-29 DIAGNOSIS — G4733 Obstructive sleep apnea (adult) (pediatric): Secondary | ICD-10-CM | POA: Diagnosis present

## 2017-11-29 DIAGNOSIS — I700 Atherosclerosis of aorta: Secondary | ICD-10-CM | POA: Diagnosis present

## 2017-11-29 DIAGNOSIS — R410 Disorientation, unspecified: Secondary | ICD-10-CM | POA: Diagnosis not present

## 2017-11-29 DIAGNOSIS — M109 Gout, unspecified: Secondary | ICD-10-CM | POA: Diagnosis present

## 2017-11-29 DIAGNOSIS — N179 Acute kidney failure, unspecified: Secondary | ICD-10-CM | POA: Diagnosis present

## 2017-11-29 DIAGNOSIS — A419 Sepsis, unspecified organism: Secondary | ICD-10-CM

## 2017-11-29 DIAGNOSIS — Z87891 Personal history of nicotine dependence: Secondary | ICD-10-CM

## 2017-11-29 DIAGNOSIS — N400 Enlarged prostate without lower urinary tract symptoms: Secondary | ICD-10-CM | POA: Diagnosis present

## 2017-11-29 DIAGNOSIS — E119 Type 2 diabetes mellitus without complications: Secondary | ICD-10-CM

## 2017-11-29 DIAGNOSIS — Z9981 Dependence on supplemental oxygen: Secondary | ICD-10-CM | POA: Diagnosis not present

## 2017-11-29 DIAGNOSIS — E1110 Type 2 diabetes mellitus with ketoacidosis without coma: Principal | ICD-10-CM | POA: Diagnosis present

## 2017-11-29 DIAGNOSIS — Z9114 Patient's other noncompliance with medication regimen: Secondary | ICD-10-CM

## 2017-11-29 DIAGNOSIS — E7800 Pure hypercholesterolemia, unspecified: Secondary | ICD-10-CM | POA: Diagnosis present

## 2017-11-29 DIAGNOSIS — I351 Nonrheumatic aortic (valve) insufficiency: Secondary | ICD-10-CM | POA: Diagnosis not present

## 2017-11-29 DIAGNOSIS — Z79899 Other long term (current) drug therapy: Secondary | ICD-10-CM

## 2017-11-29 DIAGNOSIS — Z794 Long term (current) use of insulin: Secondary | ICD-10-CM

## 2017-11-29 DIAGNOSIS — I130 Hypertensive heart and chronic kidney disease with heart failure and stage 1 through stage 4 chronic kidney disease, or unspecified chronic kidney disease: Secondary | ICD-10-CM | POA: Diagnosis present

## 2017-11-29 DIAGNOSIS — F419 Anxiety disorder, unspecified: Secondary | ICD-10-CM | POA: Diagnosis present

## 2017-11-29 DIAGNOSIS — I2510 Atherosclerotic heart disease of native coronary artery without angina pectoris: Secondary | ICD-10-CM | POA: Diagnosis present

## 2017-11-29 DIAGNOSIS — R6510 Systemic inflammatory response syndrome (SIRS) of non-infectious origin without acute organ dysfunction: Secondary | ICD-10-CM

## 2017-11-29 DIAGNOSIS — I252 Old myocardial infarction: Secondary | ICD-10-CM | POA: Diagnosis not present

## 2017-11-29 DIAGNOSIS — R413 Other amnesia: Secondary | ICD-10-CM

## 2017-11-29 DIAGNOSIS — N189 Chronic kidney disease, unspecified: Secondary | ICD-10-CM | POA: Diagnosis present

## 2017-11-29 DIAGNOSIS — I313 Pericardial effusion (noninflammatory): Secondary | ICD-10-CM | POA: Diagnosis present

## 2017-11-29 DIAGNOSIS — G3184 Mild cognitive impairment, so stated: Secondary | ICD-10-CM | POA: Diagnosis present

## 2017-11-29 DIAGNOSIS — N183 Chronic kidney disease, stage 3 (moderate): Secondary | ICD-10-CM | POA: Diagnosis present

## 2017-11-29 DIAGNOSIS — Z955 Presence of coronary angioplasty implant and graft: Secondary | ICD-10-CM

## 2017-11-29 DIAGNOSIS — Z96651 Presence of right artificial knee joint: Secondary | ICD-10-CM | POA: Diagnosis present

## 2017-11-29 DIAGNOSIS — J45909 Unspecified asthma, uncomplicated: Secondary | ICD-10-CM | POA: Diagnosis present

## 2017-11-29 DIAGNOSIS — K219 Gastro-esophageal reflux disease without esophagitis: Secondary | ICD-10-CM | POA: Diagnosis present

## 2017-11-29 DIAGNOSIS — I5032 Chronic diastolic (congestive) heart failure: Secondary | ICD-10-CM | POA: Diagnosis present

## 2017-11-29 DIAGNOSIS — Z86711 Personal history of pulmonary embolism: Secondary | ICD-10-CM | POA: Diagnosis present

## 2017-11-29 DIAGNOSIS — K222 Esophageal obstruction: Secondary | ICD-10-CM | POA: Diagnosis present

## 2017-11-29 DIAGNOSIS — Z7901 Long term (current) use of anticoagulants: Secondary | ICD-10-CM | POA: Diagnosis not present

## 2017-11-29 DIAGNOSIS — R52 Pain, unspecified: Secondary | ICD-10-CM

## 2017-11-29 LAB — LACTIC ACID, PLASMA: LACTIC ACID, VENOUS: 2.7 mmol/L — AB (ref 0.5–1.9)

## 2017-11-29 LAB — I-STAT CG4 LACTIC ACID, ED: Lactic Acid, Venous: 5.21 mmol/L (ref 0.5–1.9)

## 2017-11-29 LAB — URINALYSIS, ROUTINE W REFLEX MICROSCOPIC
Bilirubin Urine: NEGATIVE
Glucose, UA: >=500 mg/dL — AB
KETONES UR: NEGATIVE mg/dL
NITRITE: NEGATIVE
PH: 6 (ref 5.0–8.0)
PROTEIN: 100 mg/dL — AB
Specific Gravity, Urine: 1.012 (ref 1.005–1.030)

## 2017-11-29 LAB — CBC WITH DIFFERENTIAL/PLATELET
Abs Immature Granulocytes: 0.1 10*3/uL (ref 0.0–0.1)
BASOS ABS: 0.1 10*3/uL (ref 0.0–0.1)
Basophils Relative: 0 %
EOS ABS: 0.1 10*3/uL (ref 0.0–0.7)
EOS PCT: 0 %
HCT: 48.6 % (ref 39.0–52.0)
HEMOGLOBIN: 16.8 g/dL (ref 13.0–17.0)
Immature Granulocytes: 1 %
LYMPHS PCT: 8 %
Lymphs Abs: 1.1 10*3/uL (ref 0.7–4.0)
MCH: 33.1 pg (ref 26.0–34.0)
MCHC: 34.6 g/dL (ref 30.0–36.0)
MCV: 95.7 fL (ref 78.0–100.0)
MONO ABS: 0.7 10*3/uL (ref 0.1–1.0)
Monocytes Relative: 5 %
Neutro Abs: 11.2 10*3/uL — ABNORMAL HIGH (ref 1.7–7.7)
Neutrophils Relative %: 86 %
Platelets: 276 10*3/uL (ref 150–400)
RBC: 5.08 MIL/uL (ref 4.22–5.81)
RDW: 13.1 % (ref 11.5–15.5)
WBC: 13.2 10*3/uL — AB (ref 4.0–10.5)

## 2017-11-29 LAB — I-STAT VENOUS BLOOD GAS, ED
Bicarbonate: 21 mmol/L (ref 20.0–28.0)
O2 Saturation: 96 %
PCO2 VEN: 26.4 mmHg — AB (ref 44.0–60.0)
PH VEN: 7.509 — AB (ref 7.250–7.430)
TCO2: 22 mmol/L (ref 22–32)
pO2, Ven: 71 mmHg — ABNORMAL HIGH (ref 32.0–45.0)

## 2017-11-29 LAB — I-STAT TROPONIN, ED: Troponin i, poc: 0.03 ng/mL (ref 0.00–0.08)

## 2017-11-29 LAB — CBG MONITORING, ED
GLUCOSE-CAPILLARY: 441 mg/dL — AB (ref 70–99)
GLUCOSE-CAPILLARY: 339 mg/dL — AB (ref 70–99)
Glucose-Capillary: >600 mg/dL (ref 70–99)
Glucose-Capillary: 565 mg/dL (ref 70–99)

## 2017-11-29 LAB — BASIC METABOLIC PANEL
Anion gap: 9 (ref 5–15)
BUN: 22 mg/dL (ref 8–23)
CHLORIDE: 109 mmol/L (ref 98–111)
CO2: 21 mmol/L — ABNORMAL LOW (ref 22–32)
CREATININE: 2.37 mg/dL — AB (ref 0.61–1.24)
Calcium: 8.2 mg/dL — ABNORMAL LOW (ref 8.9–10.3)
GFR calc Af Amer: 29 mL/min — ABNORMAL LOW (ref >60)
GFR, EST NON AFRICAN AMERICAN: 25 mL/min — AB (ref >60)
Glucose, Bld: 338 mg/dL — ABNORMAL HIGH (ref 70–99)
Potassium: 4 mmol/L (ref 3.5–5.1)
Sodium: 139 mmol/L (ref 135–145)

## 2017-11-29 LAB — COMPREHENSIVE METABOLIC PANEL
ALT: 25 U/L (ref 0–44)
AST: 38 U/L (ref 15–41)
Albumin: 3.7 g/dL (ref 3.5–5.0)
Alkaline Phosphatase: 97 U/L (ref 38–126)
Anion gap: 17 — ABNORMAL HIGH (ref 5–15)
BUN: 25 mg/dL — AB (ref 8–23)
CO2: 19 mmol/L — ABNORMAL LOW (ref 22–32)
CREATININE: 2.74 mg/dL — AB (ref 0.61–1.24)
Calcium: 9.7 mg/dL (ref 8.9–10.3)
Chloride: 97 mmol/L — ABNORMAL LOW (ref 98–111)
GFR calc Af Amer: 24 mL/min — ABNORMAL LOW (ref >60)
GFR, EST NON AFRICAN AMERICAN: 21 mL/min — AB (ref >60)
GLUCOSE: 589 mg/dL — AB (ref 70–99)
Potassium: 3.7 mmol/L (ref 3.5–5.1)
SODIUM: 133 mmol/L — AB (ref 135–145)
TOTAL PROTEIN: 6.5 g/dL (ref 6.5–8.1)
Total Bilirubin: 1 mg/dL (ref 0.3–1.2)

## 2017-11-29 LAB — PROCALCITONIN: Procalcitonin: 0.1 ng/mL

## 2017-11-29 MED ORDER — POTASSIUM CHLORIDE CRYS ER 20 MEQ PO TBCR
20.0000 meq | EXTENDED_RELEASE_TABLET | ORAL | Status: AC
  Administered 2017-11-30: 20 meq via ORAL
  Filled 2017-11-29: qty 1

## 2017-11-29 MED ORDER — SODIUM CHLORIDE 0.9 % IV SOLN: INTRAVENOUS | Status: DC

## 2017-11-29 MED ORDER — TAMSULOSIN HCL 0.4 MG PO CAPS
0.4000 mg | ORAL_CAPSULE | Freq: Every day | ORAL | Status: DC
  Administered 2017-11-30 – 2017-12-02 (×3): 0.4 mg via ORAL
  Filled 2017-11-29 (×3): qty 1

## 2017-11-29 MED ORDER — SODIUM CHLORIDE 0.9 % IV SOLN
INTRAVENOUS | Status: DC
  Filled 2017-11-29: qty 1

## 2017-11-29 MED ORDER — FINASTERIDE 5 MG PO TABS
5.0000 mg | ORAL_TABLET | Freq: Every day | ORAL | Status: DC
  Administered 2017-11-30 – 2017-12-02 (×3): 5 mg via ORAL
  Filled 2017-11-29 (×3): qty 1

## 2017-11-29 MED ORDER — SODIUM CHLORIDE 0.9 % IV SOLN
2.0000 g | Freq: Once | INTRAVENOUS | Status: AC
  Administered 2017-11-29: 2 g via INTRAVENOUS
  Filled 2017-11-29: qty 2

## 2017-11-29 MED ORDER — ACETAMINOPHEN 325 MG PO TABS
650.0000 mg | ORAL_TABLET | Freq: Once | ORAL | Status: AC
  Administered 2017-11-29: 650 mg via ORAL
  Filled 2017-11-29: qty 2

## 2017-11-29 MED ORDER — VANCOMYCIN HCL 10 G IV SOLR
2000.0000 mg | INTRAVENOUS | Status: AC
  Administered 2017-11-29: 2000 mg via INTRAVENOUS
  Filled 2017-11-29: qty 2000

## 2017-11-29 MED ORDER — SODIUM CHLORIDE 0.9 % IV SOLN
1.0000 g | INTRAVENOUS | Status: DC
  Filled 2017-11-29: qty 1

## 2017-11-29 MED ORDER — FENOFIBRATE 160 MG PO TABS
160.0000 mg | ORAL_TABLET | Freq: Every day | ORAL | Status: DC
  Administered 2017-11-30 – 2017-12-02 (×3): 160 mg via ORAL
  Filled 2017-11-29 (×3): qty 1

## 2017-11-29 MED ORDER — HEPARIN SODIUM (PORCINE) 5000 UNIT/ML IJ SOLN: 5000.0000 [IU] | Freq: Three times a day (TID) | INTRAMUSCULAR | Status: DC

## 2017-11-29 MED ORDER — ONDANSETRON HCL 4 MG/2ML IJ SOLN: 4.0000 mg | Freq: Four times a day (QID) | INTRAMUSCULAR | Status: DC | PRN

## 2017-11-29 MED ORDER — SODIUM CHLORIDE 0.9 % IV SOLN
1000.0000 mL | INTRAVENOUS | Status: DC
  Administered 2017-11-29: 1000 mL via INTRAVENOUS

## 2017-11-29 MED ORDER — DEXTROSE-NACL 5-0.45 % IV SOLN
INTRAVENOUS | Status: DC
  Administered 2017-11-29: via INTRAVENOUS

## 2017-11-29 MED ORDER — ACETAMINOPHEN 325 MG PO TABS
650.0000 mg | ORAL_TABLET | Freq: Four times a day (QID) | ORAL | Status: DC | PRN
  Administered 2017-11-30: 650 mg via ORAL
  Filled 2017-11-29: qty 2

## 2017-11-29 MED ORDER — DEXTROSE 50 % IV SOLN: 25.0000 mL | INTRAVENOUS | Status: DC | PRN

## 2017-11-29 MED ORDER — VANCOMYCIN HCL IN DEXTROSE 1-5 GM/200ML-% IV SOLN: 1000.0000 mg | Freq: Once | INTRAVENOUS | Status: DC

## 2017-11-29 MED ORDER — PRAVASTATIN SODIUM 40 MG PO TABS
80.0000 mg | ORAL_TABLET | Freq: Every day | ORAL | Status: DC
  Administered 2017-11-30 – 2017-12-02 (×3): 80 mg via ORAL
  Filled 2017-11-29 (×3): qty 2

## 2017-11-29 MED ORDER — ALBUTEROL SULFATE (2.5 MG/3ML) 0.083% IN NEBU: 2.5000 mg | INHALATION_SOLUTION | RESPIRATORY_TRACT | Status: DC | PRN

## 2017-11-29 MED ORDER — APIXABAN 5 MG PO TABS
5.0000 mg | ORAL_TABLET | Freq: Two times a day (BID) | ORAL | Status: DC
  Administered 2017-11-30 – 2017-12-02 (×6): 5 mg via ORAL
  Filled 2017-11-29 (×6): qty 1

## 2017-11-29 MED ORDER — FLUOXETINE HCL 20 MG PO CAPS
20.0000 mg | ORAL_CAPSULE | Freq: Every day | ORAL | Status: DC
  Administered 2017-11-30 – 2017-12-02 (×3): 20 mg via ORAL
  Filled 2017-11-29 (×3): qty 1

## 2017-11-29 MED ORDER — ONDANSETRON HCL 4 MG PO TABS: 4.0000 mg | ORAL_TABLET | Freq: Four times a day (QID) | ORAL | Status: DC | PRN

## 2017-11-29 MED ORDER — OXYBUTYNIN CHLORIDE ER 10 MG PO TB24
10.0000 mg | ORAL_TABLET | Freq: Every day | ORAL | Status: DC
  Administered 2017-11-30 – 2017-12-01 (×3): 10 mg via ORAL
  Filled 2017-11-29 (×4): qty 1

## 2017-11-29 MED ORDER — SODIUM CHLORIDE 0.9 % IV BOLUS (SEPSIS)
1000.0000 mL | Freq: Once | INTRAVENOUS | Status: AC
  Administered 2017-11-29: 1000 mL via INTRAVENOUS

## 2017-11-29 MED ORDER — METRONIDAZOLE IN NACL 5-0.79 MG/ML-% IV SOLN
500.0000 mg | Freq: Three times a day (TID) | INTRAVENOUS | Status: DC
  Administered 2017-11-29: 500 mg via INTRAVENOUS
  Filled 2017-11-29 (×2): qty 100

## 2017-11-29 MED ORDER — VANCOMYCIN HCL 10 G IV SOLR: 1500.0000 mg | INTRAVENOUS | Status: DC

## 2017-11-29 MED ORDER — SODIUM CHLORIDE 0.9 % IV SOLN
INTRAVENOUS | Status: DC
  Administered 2017-11-29: 5.1 [IU]/h via INTRAVENOUS
  Filled 2017-11-29: qty 1

## 2017-11-29 MED ORDER — ACETAMINOPHEN 650 MG RE SUPP: 650.0000 mg | Freq: Four times a day (QID) | RECTAL | Status: DC | PRN

## 2017-11-29 MED ORDER — FLUTICASONE PROPIONATE 50 MCG/ACT NA SUSP
2.0000 | Freq: Every day | NASAL | Status: DC
  Administered 2017-12-01 – 2017-12-02 (×2): 2 via NASAL
  Filled 2017-11-29: qty 16

## 2017-11-29 MED ORDER — LORATADINE 10 MG PO TABS
10.0000 mg | ORAL_TABLET | Freq: Every day | ORAL | Status: DC
  Administered 2017-11-30 – 2017-12-02 (×3): 10 mg via ORAL
  Filled 2017-11-29 (×3): qty 1

## 2017-11-29 MED ORDER — PANTOPRAZOLE SODIUM 40 MG PO TBEC
40.0000 mg | DELAYED_RELEASE_TABLET | Freq: Two times a day (BID) | ORAL | Status: DC
  Administered 2017-11-30 – 2017-12-02 (×6): 40 mg via ORAL
  Filled 2017-11-29 (×6): qty 1

## 2017-11-29 MED ORDER — DEXTROSE-NACL 5-0.45 % IV SOLN: INTRAVENOUS | Status: DC

## 2017-11-29 MED ORDER — INSULIN REGULAR BOLUS VIA INFUSION
0.0000 [IU] | Freq: Three times a day (TID) | INTRAVENOUS | Status: DC
  Filled 2017-11-29: qty 10

## 2017-11-29 NOTE — Progress Notes (Addendum)
Pharmacy Antibiotic Note  Joshua BowensRaymond Walter Dalton is a 77 y.o. male with a PMH of CKD, CAD, DVT, MI, T2DM admitted on 11/29/2017 with sepsis.  Pharmacy has been consulted for vancomycin and cefepime dosing. Tmax 101.9, WBC 13.2. Scr 2.74, Bicarb 19, anion gap 17,   Plan: Vancomycin 2000mg  IV x1, then 1500mg  IV q 8h Cefepime 2g IVx1, then 1g IV q 24h Monitor renal function, cultures, troughs prn F/u LOT/de-escaltion   Weight: 222 lb (100.7 kg)  Temp (24hrs), Avg:100.4 F (38 C), Min:98.9 F (37.2 C), Max:101.9 F (38.8 C)  No results for input(s): WBC, CREATININE, LATICACIDVEN, VANCOTROUGH, VANCOPEAK, VANCORANDOM, GENTTROUGH, GENTPEAK, GENTRANDOM, TOBRATROUGH, TOBRAPEAK, TOBRARND, AMIKACINPEAK, AMIKACINTROU, AMIKACIN in the last 168 hours.  Estimated Creatinine Clearance: 33.7 mL/min (A) (by C-G formula based on SCr of 2.01 mg/dL (H)).    No Known Allergies  Antimicrobials this admission: vancomycin 8/29 >>  cefepime 8/29 >>   Dose adjustments this admission:   Microbiology results: 8/29 BCx:   Thank you for allowing pharmacy to be a part of this patient's care.  [name], PharmD PGY1 Pharmacy Resident 11/29/2017    7:23 PM   I discussed / reviewed the pharmacy note by Dr. Andi DevonWhiteside and I agree with the resident's findings and plans as documented. Doses added. [name] S. Merilynn Finlandobertson, PharmD, BCPS Clinical Staff Pharmacist Pager [phone]

## 2017-11-29 NOTE — ED Notes (Signed)
Pt's spO2 92 on 4L [place]

## 2017-11-29 NOTE — Progress Notes (Signed)
Pt placed on CPAP for the night with auto settings.  Pt tolerating well with 2lpm oxygen and medium full face mask.

## 2017-11-29 NOTE — ED Notes (Signed)
Date and time results received: 11/29/17 8:23 PM (use smartphrase ".now" to insert current time)  Test: glucose Critical Value: 589  Name of Provider Notified: Dr. Charm BargesButler  Orders Received? Or Actions Taken?: no new orders

## 2017-11-29 NOTE — ED Provider Notes (Signed)
[facility] EMERGENCY DEPARTMENT Provider Note   CSN: [id number] Arrival date & time: 11/29/17  1834     History   Chief Complaint Chief Complaint  Patient presents with  . Fall  . Fever  . Tachycardia    HPI Joshua Dalton is a 77 y.o. male.  The history is provided by the patient and the EMS personnel. No language interpreter was used.  Fall   Fever       77 year old male with history of CHF, CAD, CKD, diabetes brought here via EMS for weakness and fall.  Patient report he went to the car wash to wash his car.  however upon trying to get back into his car, he endorsed he was too weak to get into the car.  His knees gave out on him and he fell onto the ground.  He denies striking his head or any loss of consciousness.  Denies any other present time symptoms prior to the fall.  He does endorse having excessive thirst and polyuria for the past week or 2.  He checks his blood sugar regularly and is normally in the 200s.  He mentioned been compliant with his medication.  He denies any recent medication changes or any recent sickness.  He denies having fever, chills, headache, chest pain, trouble breathing, abdominal pain, back pain or dysuria.  He denies any confusion. Family report he has been more confused lately.  He doesn't know where his car is and how he fell.   Past Medical History:  Diagnosis Date  . Anxiety   . Arthritis    "all over" (06/11/2017)  . Asthma   . Chronic diastolic (congestive) heart failure (HCC)    a. echo in 08/2016 showed a preserved EF of 60-65%, Grade 1 DD, and no WMA. Mild AI noted.   . Chronic kidney disease, stage III (moderate) (HCC)   . Coronary artery disease   . Depression   . DVT (deep venous thrombosis) (HCC) 2017   RLE  . GERD (gastroesophageal reflux disease)   . Gout    "on daily RX"  (06/11/2017)  . History of kidney stones   . Hypercholesteremia   . Hypertension   . Myocardial infarction (HCC)   . On  home oxygen therapy    "2L; at night only" (06/11/2017)  . OSA on CPAP    "w/2L O2" (06/11/2017)  . Personal history of venous thrombosis and embolism   . Type II diabetes mellitus [facility])     Patient Active Problem List   Diagnosis Date Noted  . Acute cholecystitis   . Chronic anticoagulation   . History of pulmonary embolus (PE)   . History of nephrolithiasis   . Pulmonary embolism (HCC) 01/27/2017  . SOB (shortness of breath) 01/26/2017  . Acute DVT (deep venous thrombosis) (HCC) 01/26/2017  . Chronic diastolic CHF (congestive heart failure) (HCC) 01/26/2017  . Chronic respiratory failure with hypoxia (HCC) 09/21/2016  . HLD (hyperlipidemia) 09/19/2016  . Obstructive sleep apnea 09/03/2016  . 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 Dysphagia 09/03/2016  . History of deep venous thrombosis (DVT) of distal vein of right lower extremity 09/03/2016  . Dyspnea 08/20/2016  . Insulin dependent type 2 diabetes mellitus, controlled (HCC) 08/20/2016  . HTN (hypertension), benign 08/20/2016  . Chronic renal insufficiency, stage 3 (moderate) (HCC) 08/20/2016    Past Surgical History:  Procedure Laterality Date  . CHOLECYSTECTOMY N/A 06/13/2017   Procedure: LAPAROSCOPIC CHOLECYSTECTOMY WITH INTRAOPERATIVE CHOLANGIOGRAM;  Surgeon: Manus Ruddsuei, Matthew, MD;  [facility];  Service: General;  Laterality: N/A;  . CORONARY ANGIOPLASTY WITH STENT PLACEMENT    . CYSTOSCOPY     "[place]"  . CYSTOSCOPY W/ STONE MANIPULATION  2012  . ESOPHAGOGASTRODUODENOSCOPY (EGD) WITH ESOPHAGEAL DILATION    . ESOPHAGOGASTRODUODENOSCOPY (EGD) WITH PROPOFOL N/A 08/26/2016   Procedure: ESOPHAGOGASTRODUODENOSCOPY (EGD) WITH PROPOFOL;  Surgeon: Kathi DerBrahmbhatt, Parag, MD;  Location: [facility] ENDOSCOPY;  Service: Gastroenterology;  Laterality: N/A;  . JOINT REPLACEMENT    . TOTAL KNEE ARTHROPLASTY Right 2017        Home Medications    Prior to Admission medications   Medication Sig Start Date End  Date Taking? Authorizing Provider  allopurinol (ZYLOPRIM) 300 MG tablet Take 300 mg by mouth daily.    [provider]  apixaban (ELIQUIS) 5 MG TABS tablet 10mg  BID till 02/04/17 then 5mg  BID from 02/05/17 onwards Patient taking differently: Take 5 mg by mouth 2 (two) times daily.  01/30/17   Glade LloydAlekh, Kshitiz, MD  fenofibrate (TRICOR) 145 MG tablet Take 145 mg by mouth daily.    [provider]  fexofenadine (ALLEGRA) 180 MG tablet Take 180 mg by mouth daily.    [provider]  finasteride (PROSCAR) 5 MG tablet Take 5 mg by mouth daily.    [provider]  FLUoxetine (PROZAC) 20 MG capsule Take 20 mg by mouth daily.    [provider]  fluticasone (FLONASE) 50 MCG/ACT nasal spray Place 2 sprays into both nostrils daily.    [provider]  furosemide (LASIX) 40 MG tablet Take 40 mg by mouth daily.     [provider]  insulin aspart (NOVOLOG) 100 UNIT/ML injection Inject 10 Units into the skin 3 (three) times daily before meals. Patient taking differently: Inject 20 Units into the skin 3 (three) times daily before meals.  01/30/17   Glade LloydAlekh, Kshitiz, MD  insulin glargine (LANTUS) 100 UNIT/ML injection Inject 0.45 mLs (45 Units total) into the skin at bedtime. Patient taking differently: Inject 75 Units into the skin at bedtime.  01/30/17   Glade LloydAlekh, Kshitiz, MD  oxybutynin (DITROPAN-XL) 10 MG 24 hr tablet Take 10 mg by mouth at bedtime.    [provider]  pantoprazole (PROTONIX) 40 MG tablet Take 1 tablet (40 mg total) by mouth daily. Patient taking differently: Take 40 mg by mouth 2 (two) times daily.  08/26/16 11/08/25  Clydia LlanoElmahi, Mutaz, MD  pravastatin (PRAVACHOL) 80 MG tablet Take 80 mg by mouth daily.    [provider]  tamsulosin (FLOMAX) 0.4 MG CAPS capsule Take 0.4 mg by mouth daily.  08/20/16   [provider]    Family History Family History  Problem Relation Age of Onset  . Hypertension Mother   . Heart  disease Mother   . Heart attack Father        died younger than pt's current age    Social History Social History   Tobacco Use  . Smoking status: Former Smoker    Packs/day: 1.00    Years: 1.00    Pack years: 1.00    Types: Cigarettes    Last attempt to quit: 1959    Years since quitting: 60.6  . Smokeless tobacco: Never Used  Substance Use Topics  . Alcohol use: No  . Drug use: No     Allergies   Patient has no known allergies.   Review of Systems Review of Systems  Constitutional: Positive for fever.  All other systems reviewed  and are negative.    Physical Exam Updated Vital Signs BP (!) 95/55 (BP Location: Left Arm)   Pulse (!) 123   Temp 98.9 F (37.2 C) (Oral)   Resp 16   SpO2 (!) 89%   Physical Exam  Constitutional: He is oriented to person, place, and time. He appears well-developed and well-nourished. No distress.  Obese male ill-appearing.  HENT:  Head: Atraumatic.  Mouth is dry  Eyes: Pupils are equal, round, and reactive to light. Conjunctivae and EOM are normal.  Neck: Neck supple.  No nuchal rigidity  Cardiovascular:  Tachycardia without murmur rubs or gallops  Pulmonary/Chest: Effort normal and breath sounds normal.  Abdominal: Soft. Bowel sounds are normal. He exhibits no distension. There is no tenderness.  Neurological: He is alert and oriented to person, place, and time. No cranial nerve deficit or sensory deficit. GCS eye subscore is 4. GCS verbal subscore is 5. GCS motor subscore is 6.  Global weakness without focal weakness or numbness to all 4 extremities.  Skin: No rash noted.  Skin abrasion noted to bilateral anterior knee with normal knee flexion and extension.  Psychiatric: He has a normal mood and affect.  Nursing note and vitals reviewed.    ED Treatments / Results  Labs (all labs ordered are listed, but only abnormal results are displayed) Labs Reviewed  COMPREHENSIVE METABOLIC PANEL - Abnormal; Notable for the  following components:      Result Value   Sodium 133 (*)    Chloride 97 (*)    CO2 19 (*)    Glucose, Bld 589 (*)    BUN 25 (*)    Creatinine, Ser 2.74 (*)    GFR calc non Af Amer 21 (*)    GFR calc Af Amer 24 (*)    Anion gap 17 (*)    All other components within normal limits  CBC WITH DIFFERENTIAL/PLATELET - Abnormal; Notable for the following components:   WBC 13.2 (*)    Neutro Abs 11.2 (*)    All other components within normal limits  URINALYSIS, ROUTINE W REFLEX MICROSCOPIC - Abnormal; Notable for the following components:   APPearance HAZY (*)    Glucose, UA >=500 (*)    Hgb urine dipstick SMALL (*)    Protein, ur 100 (*)    Leukocytes, UA TRACE (*)    Bacteria, UA RARE (*)    All other components within normal limits  CBG MONITORING, ED - Abnormal; Notable for the following components:   Glucose-Capillary >600 (*)    All other components within normal limits  I-STAT CG4 LACTIC ACID, ED - Abnormal; Notable for the following components:   Lactic Acid, Venous 5.21 (*)    All other components within normal limits  I-STAT VENOUS BLOOD GAS, ED - Abnormal; Notable for the following components:   pH, Ven 7.509 (*)    pCO2, Ven 26.4 (*)    pO2, Ven 71.0 (*)    All other components within normal limits  CBG MONITORING, ED - Abnormal; Notable for the following components:   Glucose-Capillary 565 (*)    All other components within normal limits  CBG MONITORING, ED - Abnormal; Notable for the following components:   Glucose-Capillary 441 (*)    All other components within normal limits  CULTURE, BLOOD (ROUTINE X 2)  CULTURE, BLOOD (ROUTINE X 2)  I-STAT TROPONIN, ED  I-STAT CG4 LACTIC ACID, ED    EKG EKG Interpretation  Date/Time:  Thursday November 29 2017 18:47:27 EDT  Ventricular Rate:  125 PR Interval:    QRS Duration: 116 QT Interval:  336 QTC Calculation: 485 R Axis:   92 Text Interpretation:  Junctional tachycardia vs sinus with prolonged PR Right bundle branch  block Low voltage, precordial leads increased rate from prior 10/18 Confirmed by Meridee Score [phone]) on 11/29/2017 6:51:06 PM   Radiology Dg Chest 1 View  Result Date: 11/29/2017 CLINICAL DATA:  Patient tripped and fell at car wash. EXAM: CHEST  1 VIEW COMPARISON:  11/08/2017 FINDINGS: AP portable semi upright view of the chest. Borderline cardiomegaly with aortic atherosclerosis. No acute pulmonary consolidation, effusion or pneumothorax. No overt pulmonary edema. No acute osseous abnormality. IMPRESSION: Borderline cardiomegaly with aortic atherosclerosis. No active pulmonary disease. Electronically Signed   By: Tollie Eth M.D.   On: 11/29/2017 20:07    Procedures .Critical Care Performed by: Fayrene Helper, PA-C Authorized by: Fayrene Helper, PA-C   Critical care provider statement:    Critical care time (minutes):  45   Critical care was time spent personally by me on the following activities:  Discussions with consultants, evaluation of patient's response to treatment, examination of patient, ordering and performing treatments and interventions, ordering and review of laboratory studies, ordering and review of radiographic studies, pulse oximetry, re-evaluation of patient's condition, obtaining history from patient or surrogate and review of old charts   (including critical care time)  Medications Ordered in ED Medications  0.9 %  sodium chloride infusion (1,000 mLs Intravenous New Bag/Given 11/29/17 1955)  dextrose 5 %-0.45 % sodium chloride infusion (has no administration in time range)  insulin regular (NOVOLIN R,HUMULIN R) 100 Units in sodium chloride 0.9 % 100 mL (1 Units/mL) infusion (3.8 Units/hr Intravenous Rate/Dose Change 11/29/17 2111)  metroNIDAZOLE (FLAGYL) IVPB 500 mg (0 mg Intravenous Stopped 11/29/17 2056)  vancomycin (VANCOCIN) 2,000 mg in sodium chloride 0.9 % 500 mL IVPB (2,000 mg Intravenous New Bag/Given 11/29/17 2031)  vancomycin (VANCOCIN) 1,500 mg in sodium chloride  0.9 % 500 mL IVPB (has no administration in time range)  ceFEPIme (MAXIPIME) 1 g in sodium chloride 0.9 % 100 mL IVPB (has no administration in time range)  sodium chloride 0.9 % bolus 1,000 mL (0 mLs Intravenous Stopped 11/29/17 2052)    And  sodium chloride 0.9 % bolus 1,000 mL (0 mLs Intravenous Stopped 11/29/17 2029)    And  sodium chloride 0.9 % bolus 1,000 mL (1,000 mLs Intravenous New Bag/Given 11/29/17 2029)  ceFEPIme (MAXIPIME) 2 g in sodium chloride 0.9 % 100 mL IVPB (0 g Intravenous Stopped 11/29/17 2031)  acetaminophen (TYLENOL) tablet 650 mg (650 mg Oral Given 11/29/17 2028)     Initial Impression / Assessment and Plan / ED Course  I have reviewed the triage vital signs and the nursing notes.  Pertinent labs & imaging results that were available during my care of the patient were reviewed by me and considered in my medical decision making (see chart for details).  Clinical Course as of Nov 30 2034  Thu Nov 29, 2017  4125 77 year old male here after falling outside of his car and unable to get in.  Is complaining of some shortness of breath it sounds long-standing.  He is tachypneic here and tachycardic and his blood sugar is markedly elevated.  Got some abrasions over his left knee.  He is getting some lab work and IV fluids chest x-ray EKG.   [MB]    Clinical Course User Index [MB] Terrilee Files, MD    BP 118/60  Pulse 99   Temp (!) 101.9 F (38.8 C) (Rectal)   Resp (!) 21   Wt 100.7 kg   SpO2 93%   BMI 35.83 kg/m    Final Clinical Impressions(s) / ED Diagnoses   Final diagnoses:  Sepsis, due to unspecified organism [facility])  Diabetic ketoacidosis without coma associated with type 2 diabetes mellitus (HCC)  Delirium    ED Discharge Orders    None    Which will 7:06 PM Patient with history of insulin-dependent diabetes here with generalized weakness, recent fall when his knees gave out on him.  He is found to be tachycardic with a heart rate of 123,  hypotensive with blood pressure 95/55, and febrile with a rectal temp of 101.9.  He is mildly hypoxic with O2 sats at 89 presents however he will is on chronic O2 at home and with administration of supplemental oxygen, he is resting comfortably.  Initial CBG greater than 600.  Will initiate bolus code sepsis and will provide glucose stabilizer to control his elevated CBG.  Care discussed with Dr. Charm Barges.   8:36 PM Lactic acid is 5.21, patient receiving broad-spectrum antibiotic due to unknown source.  Patient receive IV fluid at 30 mL/kg with good response, no evidence of septic shock.  Sepsis reassessment done.  His anion gap is 17, evidence of dehydration with BUN, 25, creatinine 2.74.  No ketones in urine.  Chest x-ray without evidence of pneumonia.  Tylenol given for fever.  9:43 PM Family member mention that patient has been more confused lately as well as having dysuria.  States that he was admitted to the hospital for similar manifestation several months prior without any clear explanation.  Daughter concerned that patient is not safe to drive as he appears to be exhibiting more confusion as of late.  He denies any worsening depression nor any substance use.  At this time, patient will need to be admitted for sepsis of unknown source versus dehydration and DKA.  He is receiving IV fluid and resting comfortably.  He is on supplemental oxygenation.  He is receiving broad-spectrum antibiotic.  9:52 PM Appreciate consultation from [facility] Dr. Katrinka Blazing who agrees to see and admit pt for further care.    Fayrene Helper, PA-C 11/29/17 2152    Terrilee Files, MD 11/30/17 [phone]

## 2017-11-29 NOTE — ED Triage Notes (Signed)
Per eMS:  Pt called from carswash near [place] where he was trying to get back in to his car and tripped and fell.  Denies LOc, denies head injury.  Patient just wanted scraped looked at, EMS placed him in back of truck, noted to be hypotensive (94/52), tachycardic (120's), tachypniec, states normal home O2 in chart, not on O2.  Warm to touch, but patient had been outside in sun.  No fever upon arrival.  Pt somnolent, falls asleep easily if not stimulated, CBG 594 (reading "high" at arrival in ER)

## 2017-11-29 NOTE — H&P (Signed)
History and Physical    Joshua Dalton ZOX:[id number] DOB: 03/28/1941 DOA: 11/29/2017  Referring MD/NP/PA: Fayrene HelperBowie Tran, PA-C PCP: Annita BrodAsenso, Philip, MD  Patient coming from: via EMS  Chief Complaint: Fall  I have personally briefly reviewed patient's old medical [facility]   HPI: Joshua Dalton is a 77 y.o. 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  History is obtained from the patient and children are present at bedside.  He reports that he had gone to get a replacement mirror for his car and had gone to the carwash to replace it.  Thereafter, patient reports that he did not fall, but had bitten down to get a piece of tape stuck him between the seats and was unable to get back up.  He denies hitting his head or any loss of consciousness.  Family makes note that he may have fallen and even lost consciousness, but previously had his license taken away due to similar symptoms.  Patient complains of polydipsia, polyuria for the past 1 to 2 weeks. Other associated symptoms include dry cough that is new, generalized weakness, and difficulty swallowing.  Denies having any recent sick contacts to his knowledge, chest pain, wheezing, nausea, vomiting, diarrhea, or dysuria symptoms.  Family makes note that the patient has become more issues with memory.  He had recently had his esophagus stretched, and it gone for follow-up 2 days ago.  Family reports patient had gotten lost getting back home.  They do not feel that it safe for him to be driving still.   ED Course: Upon admission into the emergency department patient was seen to be febrile up to 101.9 F, pulse 77-1 23, respiration 14-24, blood pressure 95/55-136/71, and O2 saturations maintained currently on 2 L of nasal cannula oxygen.  Labs revealed WBC 13.2, sodium 133, CO2 19, BUN 25, creatinine 2.74,  glucose 589, anion gap 17, and lactic acid 5.21.  Urinalysis was positive for glucose and ketones, but no signs of infection.  Chest x-ray was otherwise noted for borderline cardiomegaly without infiltrate or edema.  Sepsis protocol has been initiated with blood and urine cultures and patient was given 3 L of normal saline IV fluids, vancomycin, cefepime, and metronidazole IV.  Review of Systems  Constitutional: Positive for fever and malaise/fatigue.  HENT: Negative for congestion and ear discharge.        Positive for difficulty swallowing  Eyes: Negative for photophobia and pain.  Respiratory: Positive for cough and shortness of breath. Negative for sputum production.   Cardiovascular: Negative for chest pain and leg swelling.  Gastrointestinal: Negative for abdominal pain, constipation, diarrhea, nausea and vomiting.  Genitourinary: Positive for frequency. Negative for dysuria.  Musculoskeletal: Negative for neck pain.  Skin: Negative for itching.  Neurological: Positive for weakness. Negative for focal weakness.  Endo/Heme/Allergies: Positive for polydipsia.  Psychiatric/Behavioral: Positive for memory loss. Negative for substance abuse.    Past Medical History:  Diagnosis Date  . Anxiety   . Arthritis    "all over" (06/11/2017)  . Asthma   . Chronic diastolic (congestive) heart failure (HCC)    a. echo in 08/2016 showed a preserved EF of 60-65%, Grade 1 DD, and no WMA. Mild AI noted.   . Chronic kidney disease, stage III (moderate) (HCC)   . Coronary artery disease   . Depression   . DVT (deep  venous thrombosis) (HCC) 2017   RLE  . GERD (gastroesophageal reflux disease)   . Gout    "on daily RX"  (06/11/2017)  . History of kidney stones   . Hypercholesteremia   . Hypertension   . Myocardial infarction (HCC)   . On home oxygen therapy    "2L; at night only" (06/11/2017)  . OSA on CPAP    "w/2L O2" (06/11/2017)  . Personal history of venous thrombosis and embolism   . Type  II diabetes mellitus (HCC)     Past Surgical History:  Procedure Laterality Date  . CHOLECYSTECTOMY N/A 06/13/2017   Procedure: LAPAROSCOPIC CHOLECYSTECTOMY WITH INTRAOPERATIVE CHOLANGIOGRAM;  Surgeon: Manus Rudd, MD;  Location: [facility] OR;  Service: General;  Laterality: N/A;  . CORONARY ANGIOPLASTY WITH STENT PLACEMENT    . CYSTOSCOPY     "[place]"  . CYSTOSCOPY W/ STONE MANIPULATION  2012  . ESOPHAGOGASTRODUODENOSCOPY (EGD) WITH ESOPHAGEAL DILATION    . ESOPHAGOGASTRODUODENOSCOPY (EGD) WITH PROPOFOL N/A 08/26/2016   Procedure: ESOPHAGOGASTRODUODENOSCOPY (EGD) WITH PROPOFOL;  Surgeon: Kathi Der, MD;  Location: [facility] ENDOSCOPY;  Service: Gastroenterology;  Laterality: N/A;  . JOINT REPLACEMENT    . TOTAL KNEE ARTHROPLASTY Right 2017     reports that he quit smoking about 60 years ago. His smoking use included cigarettes. He has a 1.00 pack-year smoking history. He has never used smokeless tobacco. He reports that he does not drink alcohol or use drugs.  No Known Allergies  Family History  Problem Relation Age of Onset  . Hypertension Mother   . Heart disease Mother   . Heart attack Father        died younger than pt's current age    Prior to Admission medications   Medication Sig Start Date End Date Taking? Authorizing Provider  allopurinol (ZYLOPRIM) 300 MG tablet Take 300 mg by mouth daily.    [provider]  apixaban (ELIQUIS) 5 MG TABS tablet 10mg  BID till 02/04/17 then 5mg  BID from 02/05/17 onwards Patient taking differently: Take 5 mg by mouth 2 (two) times daily.  01/30/17   Glade Lloyd, MD  fenofibrate (TRICOR) 145 MG tablet Take 145 mg by mouth daily.    [provider]  fexofenadine (ALLEGRA) 180 MG tablet Take 180 mg by mouth daily.    [provider]  finasteride (PROSCAR) 5 MG tablet Take 5 mg by mouth daily.    [provider]  FLUoxetine (PROZAC) 20 MG capsule Take 20 mg by mouth daily.    [provider]    fluticasone (FLONASE) 50 MCG/ACT nasal spray Place 2 sprays into both nostrils daily.    [provider]  furosemide (LASIX) 40 MG tablet Take 40 mg by mouth daily.     [provider]  insulin aspart (NOVOLOG) 100 UNIT/ML injection Inject 10 Units into the skin 3 (three) times daily before meals. Patient taking differently: Inject 20 Units into the skin 3 (three) times daily before meals.  01/30/17   Glade Lloyd, MD  insulin glargine (LANTUS) 100 UNIT/ML injection Inject 0.45 mLs (45 Units total) into the skin at bedtime. Patient taking differently: Inject 75 Units into the skin at bedtime.  01/30/17   Glade Lloyd, MD  oxybutynin (DITROPAN-XL) 10 MG 24 hr tablet Take 10 mg by mouth at bedtime.    [provider]  pantoprazole (PROTONIX) 40 MG tablet Take 1 tablet (40 mg total) by mouth daily. Patient taking differently: Take 40 mg by mouth 2 (two) times daily.  08/26/16 11/08/25  Clydia Llano, MD  pravastatin (PRAVACHOL) 80 MG tablet Take 80 mg by mouth daily.    [provider]  tamsulosin (FLOMAX) 0.4 MG CAPS capsule Take 0.4 mg by mouth daily.  08/20/16   [provider]    Physical Exam:  Constitutional: Obese elderly male in no acute distress at this time Vitals:   11/29/17 2000 11/29/17 2015 11/29/17 2130 11/29/17 2132  BP: 136/71 118/60 (!) 112/57   Pulse: (!) 109 99 86   Resp: (!) 24 (!) 21 14   Temp:    97.6 F (36.4 C)  TempSrc:    Oral  SpO2: 93% 93% 97%   Weight:       Eyes: PERRL, lids and conjunctivae normal ENMT: Mucous membranes are dry. Posterior pharynx clear of any exudate or lesions.  Neck: normal, supple, no masses, no thyromegaly Respiratory: clear to auscultation bilaterally, no wheezing, no crackles. Normal respiratory effort. No accessory muscle use.  Cardiovascular: Regular rate and rhythm, no murmurs / rubs / gallops. No extremity edema. 2+ pedal pulses. No carotid bruits.  Abdomen: no tenderness, no masses  palpated. No hepatosplenomegaly. Bowel sounds positive.  Musculoskeletal: no clubbing / cyanosis. No joint deformity upper and lower extremities. Good ROM, no contractures. Normal muscle tone.  Skin: Abrasions noted to the bilateral lower extremities. Neurologic: CN 2-12 grossly intact. Sensation intact, DTR normal. Strength 5/5 in all 4.  Psychiatric: Normal judgment and insight. Alert and oriented x 2. Normal mood.     Labs on Admission: I have personally reviewed following labs and imaging studies  CBC: Recent Labs  Lab 11/29/17 1929  WBC 13.2*  NEUTROABS 11.2*  HGB 16.8  HCT 48.6  MCV 95.7  PLT 276   Basic Metabolic Panel: Recent Labs  Lab 11/29/17 1929  NA 133*  K 3.7  CL 97*  CO2 19*  GLUCOSE 589*  BUN 25*  CREATININE 2.74*  CALCIUM 9.7   GFR: Estimated Creatinine Clearance: 24.7 mL/min (A) (by C-G formula based on SCr of 2.74 mg/dL (H)). Liver Function Tests: Recent Labs  Lab 11/29/17 1929  AST 38  ALT 25  ALKPHOS 97  BILITOT 1.0  PROT 6.5  ALBUMIN 3.7   No results for input(s): LIPASE, AMYLASE in the last 168 hours. No results for input(s): AMMONIA in the last 168 hours. Coagulation Profile: No results for input(s): INR, PROTIME in the last 168 hours. Cardiac Enzymes: No results for input(s): CKTOTAL, CKMB, CKMBINDEX, TROPONINI in the last 168 hours. BNP (last 3 results) Recent Labs    02/06/17 0000  PROBNP 146   HbA1C: No results for input(s): HGBA1C in the last 72 hours. CBG: Recent Labs  Lab 11/29/17 1842 11/29/17 2004 11/29/17 2102  GLUCAP >600* 565* 441*   Lipid Profile: No results for input(s): CHOL, HDL, LDLCALC, TRIG, CHOLHDL, LDLDIRECT in the last 72 hours. Thyroid Function Tests: No results for input(s): TSH, T4TOTAL, FREET4, T3FREE, THYROIDAB in the last 72 hours. Anemia Panel: No results for input(s): VITAMINB12, FOLATE, FERRITIN, TIBC, IRON, RETICCTPCT in the last 72 hours. Urine analysis:    Component Value Date/Time     COLORURINE YELLOW 11/29/2017 1929   APPEARANCEUR HAZY (A) 11/29/2017 1929   LABSPEC 1.012 11/29/2017 1929   PHURINE 6.0 11/29/2017 1929   GLUCOSEU >=500 (A) 11/29/2017 1929   HGBUR SMALL (A) 11/29/2017 1929   BILIRUBINUR NEGATIVE 11/29/2017 1929   KETONESUR NEGATIVE 11/29/2017 1929   PROTEINUR 100 (A) 11/29/2017 1929   NITRITE NEGATIVE 11/29/2017 1929  LEUKOCYTESUR TRACE (A) 11/29/2017 1929   Sepsis Labs: No results found for this or any previous visit (from the past 240 hour(s)).   Radiological Exams on Admission: Dg Chest 1 View  Result Date: 11/29/2017 CLINICAL DATA:  Patient tripped and fell at car wash. EXAM: CHEST  1 VIEW COMPARISON:  11/08/2017 FINDINGS: AP portable semi upright view of the chest. Borderline cardiomegaly with aortic atherosclerosis. No acute pulmonary consolidation, effusion or pneumothorax. No overt pulmonary edema. No acute osseous abnormality. IMPRESSION: Borderline cardiomegaly with aortic atherosclerosis. No active pulmonary disease. Electronically Signed   By: Tollie Eth M.D.   On: 11/29/2017 20:07     EKG: Independently reviewed.  Junctional tachycardia 125 bpm  Assessment/Plan Severe sepsis, unknown cause: Acute.  Patient presented febrile up to 101.9 F, tachycardic, tachypneic, WBC 13.2, and lactic acid 5.21.  He complains of having a dry cough that is new.  Urinalysis did not show clear signs of infection and chest x-ray otherwise noted to be clear of any infiltrate.  Sepsis protocol has been initiated with 3 L of normal saline IV fluids, and empiric antibiotics of vancomycin metronidazole, and cefepime. - Admit to a stepdown bed - Follow-up pan culture - Add on procalcitonin - Continue empiric antibiotics - Trend lactic acid levels - Consider rechecking chest x-ray in a.m.  Possible fall/syncope: Patient denies falling. - Follow-up telemetry overnight - Consider physical therapy to assess if needed  Diabetic ketoacidosis, in type II: Acute.   Initial blood sugar 589 with CO2 19, and anion gap 17.  Urinalysis positive for ketones and glucose.  Venous pH noted to be 7.5. - Check hemoglobin A1c in a.m. - Glucose stabilizer protocol initiated - Correct electrolytes as needed - Insulin drip - Normal saline IV fluids at 100 mL/h as tolerated  Acute kidney injury on chronic kidney disease stage III: Patient's baseline creatinine previously noted to be around 2 earlier this month, but presents with a creatinine of 2.74 with BUN 25. - IV fluids as seen above - Monitor intake and output - Hold nephrotoxic agents such as furosemide  History of DVT/PE on chronic anticoagulation: Patient previously had a DVT in 2016, and  was on Pradaxa until discontinued in October 2018.  Patient subsequently had a right lower extremity DVT and probable PE by VQ scan within 2 weeks of stopping medication.  Patient has been on Eliquis since.  - Continue Eliquis  Diastolic congestive heart failure: Last EF noted to be 60-65% with grade 1 dysfunction in 01/2017. - Monitor intake and output - Daily weights  Mild cognitive impairment: Family report patient having progressively worsening memory getting lost. - May warrant Mini-Mental status exam or recommended formal driving assessment  Dysphagia: Patient scheduled to have repeat esophageal dilation. - Continue outpatient follow-up with GI for esophageal dilation  Depression and anxiety - Continue Prozac  BPH - Continue Flomax and Proscar  Obstructive sleep apnea: Patient on 2 L oxygen at night - CPAP  Dyslipidemia - Continue fenofibrate and pravastatin  GERD -Continue Protonix  DVT prophylaxis: Eliquis Code Status: full  Family Communication: Discussed plan of care with the patient and family present at bedside Disposition Plan: To be determined Consults called: None Admission status: Inpatient  Clydie Braun MD [facility] Pager [phone]   If 7PM-7AM, please contact  night-coverage [email or web address] Password [facility]  11/29/2017, 9:54 PM

## 2017-11-30 DIAGNOSIS — E1110 Type 2 diabetes mellitus with ketoacidosis without coma: Secondary | ICD-10-CM | POA: Diagnosis present

## 2017-11-30 DIAGNOSIS — N189 Chronic kidney disease, unspecified: Secondary | ICD-10-CM

## 2017-11-30 DIAGNOSIS — N179 Acute kidney failure, unspecified: Secondary | ICD-10-CM | POA: Diagnosis present

## 2017-11-30 DIAGNOSIS — N400 Enlarged prostate without lower urinary tract symptoms: Secondary | ICD-10-CM | POA: Diagnosis present

## 2017-11-30 LAB — RESPIRATORY PANEL BY PCR
Adenovirus: NOT DETECTED
BORDETELLA PERTUSSIS-RVPCR: NOT DETECTED
CHLAMYDOPHILA PNEUMONIAE-RVPPCR: NOT DETECTED
Coronavirus 229E: NOT DETECTED
Coronavirus HKU1: NOT DETECTED
Coronavirus NL63: NOT DETECTED
Coronavirus OC43: NOT DETECTED
INFLUENZA B-RVPPCR: NOT DETECTED
Influenza A: NOT DETECTED
MYCOPLASMA PNEUMONIAE-RVPPCR: NOT DETECTED
Metapneumovirus: NOT DETECTED
PARAINFLUENZA VIRUS 3-RVPPCR: NOT DETECTED
PARAINFLUENZA VIRUS 4-RVPPCR: NOT DETECTED
Parainfluenza Virus 1: NOT DETECTED
Parainfluenza Virus 2: NOT DETECTED
RESPIRATORY SYNCYTIAL VIRUS-RVPPCR: NOT DETECTED
RHINOVIRUS / ENTEROVIRUS - RVPPCR: NOT DETECTED

## 2017-11-30 LAB — LACTIC ACID, PLASMA: LACTIC ACID, VENOUS: 1.6 mmol/L (ref 0.5–1.9)

## 2017-11-30 LAB — STREP PNEUMONIAE URINARY ANTIGEN: Strep Pneumo Urinary Antigen: NEGATIVE

## 2017-11-30 LAB — CBC
HCT: 42.4 % (ref 39.0–52.0)
Hemoglobin: 14.3 g/dL (ref 13.0–17.0)
MCH: 32.6 pg (ref 26.0–34.0)
MCHC: 33.7 g/dL (ref 30.0–36.0)
MCV: 96.6 fL (ref 78.0–100.0)
PLATELETS: 225 10*3/uL (ref 150–400)
RBC: 4.39 MIL/uL (ref 4.22–5.81)
RDW: 13.2 % (ref 11.5–15.5)
WBC: 11.9 10*3/uL — ABNORMAL HIGH (ref 4.0–10.5)

## 2017-11-30 LAB — GLUCOSE, CAPILLARY
GLUCOSE-CAPILLARY: 129 mg/dL — AB (ref 70–99)
GLUCOSE-CAPILLARY: 142 mg/dL — AB (ref 70–99)
GLUCOSE-CAPILLARY: 199 mg/dL — AB (ref 70–99)
GLUCOSE-CAPILLARY: 214 mg/dL — AB (ref 70–99)
GLUCOSE-CAPILLARY: 246 mg/dL — AB (ref 70–99)
Glucose-Capillary: 259 mg/dL — ABNORMAL HIGH (ref 70–99)
Glucose-Capillary: 182 mg/dL — ABNORMAL HIGH (ref 70–99)
Glucose-Capillary: 131 mg/dL — ABNORMAL HIGH (ref 70–99)
Glucose-Capillary: 117 mg/dL — ABNORMAL HIGH (ref 70–99)
Glucose-Capillary: 105 mg/dL — ABNORMAL HIGH (ref 70–99)
Glucose-Capillary: 195 mg/dL — ABNORMAL HIGH (ref 70–99)
Glucose-Capillary: 234 mg/dL — ABNORMAL HIGH (ref 70–99)

## 2017-11-30 LAB — BASIC METABOLIC PANEL
Anion gap: 5 (ref 5–15)
BUN: 20 mg/dL (ref 8–23)
CALCIUM: 8.5 mg/dL — AB (ref 8.9–10.3)
CO2: 24 mmol/L (ref 22–32)
CREATININE: 2.11 mg/dL — AB (ref 0.61–1.24)
Chloride: 112 mmol/L — ABNORMAL HIGH (ref 98–111)
GFR calc Af Amer: 33 mL/min — ABNORMAL LOW (ref >60)
GFR, EST NON AFRICAN AMERICAN: 28 mL/min — AB (ref >60)
GLUCOSE: 187 mg/dL — AB (ref 70–99)
Potassium: 3.5 mmol/L (ref 3.5–5.1)
Sodium: 141 mmol/L (ref 135–145)

## 2017-11-30 LAB — MAGNESIUM: Magnesium: 1.8 mg/dL (ref 1.7–2.4)

## 2017-11-30 LAB — HEMOGLOBIN A1C
Hgb A1c MFr Bld: 13 % — ABNORMAL HIGH (ref 4.8–5.6)
Mean Plasma Glucose: 326.4 mg/dL

## 2017-11-30 LAB — MRSA PCR SCREENING: MRSA BY PCR: NEGATIVE

## 2017-11-30 MED ORDER — INSULIN ASPART 100 UNIT/ML ~~LOC~~ SOLN
0.0000 [IU] | Freq: Three times a day (TID) | SUBCUTANEOUS | Status: DC
  Administered 2017-11-30: 3 [IU] via SUBCUTANEOUS
  Administered 2017-11-30: 2 [IU] via SUBCUTANEOUS
  Administered 2017-11-30: 3 [IU] via SUBCUTANEOUS
  Administered 2017-12-01: 5 [IU] via SUBCUTANEOUS
  Administered 2017-12-01 (×2): 8 [IU] via SUBCUTANEOUS

## 2017-11-30 MED ORDER — INSULIN ASPART 100 UNIT/ML ~~LOC~~ SOLN
0.0000 [IU] | Freq: Every day | SUBCUTANEOUS | Status: DC
  Administered 2017-11-30: 2 [IU] via SUBCUTANEOUS

## 2017-11-30 MED ORDER — OXYCODONE HCL 5 MG PO TABS: 5.0000 mg | ORAL_TABLET | ORAL | Status: DC | PRN

## 2017-11-30 MED ORDER — ALLOPURINOL 300 MG PO TABS
300.0000 mg | ORAL_TABLET | Freq: Every day | ORAL | Status: DC
  Administered 2017-12-01 – 2017-12-02 (×2): 300 mg via ORAL
  Filled 2017-11-30 (×2): qty 1

## 2017-11-30 MED ORDER — INSULIN GLARGINE 100 UNIT/ML ~~LOC~~ SOLN
10.0000 [IU] | Freq: Once | SUBCUTANEOUS | Status: AC
  Administered 2017-11-30: 10 [IU] via SUBCUTANEOUS
  Filled 2017-11-30: qty 0.1

## 2017-11-30 MED ORDER — VANCOMYCIN HCL 10 G IV SOLR
1250.0000 mg | INTRAVENOUS | Status: DC
  Filled 2017-11-30: qty 1250

## 2017-11-30 MED ORDER — INSULIN ASPART 100 UNIT/ML ~~LOC~~ SOLN: 0.0000 [IU] | Freq: Three times a day (TID) | SUBCUTANEOUS | Status: DC

## 2017-11-30 MED ORDER — TRAMADOL HCL 50 MG PO TABS
50.0000 mg | ORAL_TABLET | Freq: Four times a day (QID) | ORAL | Status: DC | PRN
  Administered 2017-11-30: 50 mg via ORAL
  Filled 2017-11-30: qty 1

## 2017-11-30 MED ORDER — INSULIN ASPART 100 UNIT/ML ~~LOC~~ SOLN: 0.0000 [IU] | Freq: Every day | SUBCUTANEOUS | Status: DC

## 2017-11-30 MED ORDER — ALBUTEROL SULFATE (2.5 MG/3ML) 0.083% IN NEBU: 2.5000 mg | INHALATION_SOLUTION | RESPIRATORY_TRACT | Status: DC | PRN

## 2017-11-30 MED ORDER — INSULIN GLARGINE 100 UNIT/ML ~~LOC~~ SOLN
22.0000 [IU] | Freq: Every day | SUBCUTANEOUS | Status: DC
  Administered 2017-11-30 – 2017-12-01 (×2): 22 [IU] via SUBCUTANEOUS
  Filled 2017-11-30 (×3): qty 0.22

## 2017-11-30 NOTE — Progress Notes (Signed)
[place] TEAM 1 - Stepdown/ICU TEAM  Sevan Mcbroom  ZOX:[id number] DOB: 08/31/1940 DOA: 11/29/2017 PCP: Annita Brod, MD    Brief Narrative:  77 y.o. male with a hx of HTN, HLD, DM2, DVT/PE on Eliquis, and CKD stage III; who presents after reportedly having a possible fall v/s an episode of severe weakness and confusion.  After arrival in the ED he was found to have a temp of 101.9 F and pulse up to 123. WBC 13.2, creatinine 2.74, glucose 589, and lactic acid 5.21.  Urinalysis was w/o signs of infection.  Chest x-ray was w/o infiltrate or edema.   Subjective: Resting comfortably in bed.  Speech is somewhat circumferential and avoiding.  Denies cp, sob, n/v, or abdom pain.    Assessment & Plan:  Severe sepsis of unknown cause v/s SIRS  RVP negative - CXR unrevealing - UA not c/w UTI - no clinical evidence of an active infection - lactate has normalized - stop abx and follow clinically   Possible fall/syncope Patient denies, but appears to have little insight into his health - appears he will need signif more assistance after d/c - PT/OT to evaluate   Uncontrolled DM2 - DKA A1c 13.0 - unclear if he had true DKA at admit, or severely elevated CBG as well as lactic acidosis - regardless, has now improved - titrate insulin tx - appears he will need help w/ insulin dosing as outpt   Acute kidney injury on CKD Stage III baseline creatinine around 2 - w/ hydration crt has returned to his baseline   Recent Labs  Lab 11/29/17 1929 11/29/17 2221 11/30/17 0220  CREATININE 2.74* 2.37* 2.11*    History of DVT/PE on chronic anticoagulation continue Eliquis  Diastolic congestive heart failure EF 60-65% with grade 1 dysfunction via TTE 01/2017 - no signif volume overload on exam Filed Weights   11/29/17 1901 11/30/17 0300  Weight: 100.7 kg 105 kg    Mild cognitive impairment Family report progressively worsening memory / getting lost - will need to investigate further  during hospital stay once given time to recover from initial metabolic insult   Esophageal stricture > Dysphagia to have repeat esophageal dilation as outpt   Depression and anxiety Continue Prozac  BPH Continue Flomax and Proscar  Obstructive sleep apnea on 2 L oxygen at night + CPAP  Dyslipidemia Continue fenofibrate and pravastatin  GERD Continue Protonix  DVT prophylaxis: eliquis  Code Status: FULL CODE Family Communication: no family present at time of exam  Disposition Plan: stable for tele - monitor for infection to declare itself - follow CBG - address possible need for placement   Consultants:  none  Antimicrobials:  Cefepime 8/29  Flagyl 8/29  Vancomycin 8/29    Objective: Blood pressure (!) 114/58, pulse 69, temperature 97.9 F (36.6 C), temperature source Oral, resp. rate 20, height 5\' 6"  (1.676 m), weight 105 kg, SpO2 98 %.  Intake/Output Summary (Last 24 hours) at 11/30/2017 1635 Last data filed at 11/30/2017 1500 Gross per 24 hour  Intake 4548.98 ml  Output 1175 ml  Net 3373.98 ml   Filed Weights   11/29/17 1901 11/30/17 0300  Weight: 100.7 kg 105 kg    Examination: General: No acute respiratory distress Lungs: Clear to auscultation bilaterally without wheezes or crackles Cardiovascular: Regular rate and rhythm without murmur gallop or rub normal S1 and S2 Abdomen: Nontender, nondistended, soft, bowel sounds positive, no rebound, no ascites, no appreciable mass Extremities: trace B LE edema  CBC: Recent Labs  Lab 11/29/17 1929 11/30/17 0220  WBC 13.2* 11.9*  NEUTROABS 11.2*  --   HGB 16.8 14.3  HCT 48.6 42.4  MCV 95.7 96.6  PLT 276 225   Basic Metabolic Panel: Recent Labs  Lab 11/29/17 1929 11/29/17 2221 11/30/17 0220  NA 133* 139 141  K 3.7 4.0 3.5  CL 97* 109 112*  CO2 19* 21* 24  GLUCOSE 589* 338* 187*  BUN 25* 22 20  CREATININE 2.74* 2.37* 2.11*  CALCIUM 9.7 8.2* 8.5*  MG  --   --  1.8   GFR: Estimated  Creatinine Clearance: 32.8 mL/min (A) (by C-G formula based on SCr of 2.11 mg/dL (H)).  Liver Function Tests: Recent Labs  Lab 11/29/17 1929  AST 38  ALT 25  ALKPHOS 97  BILITOT 1.0  PROT 6.5  ALBUMIN 3.7    HbA1C: Hgb A1c MFr Bld  Date/Time Value Ref Range Status  11/30/2017 02:20 AM 13.0 (H) 4.8 - 5.6 % Final    Comment:    (NOTE) Pre diabetes:          5.7%-6.4% Diabetes:              >6.4% Glycemic control for   <7.0% adults with diabetes   01/28/2017 09:06 AM 9.3 (H) 4.8 - 5.6 % Final    Comment:    (NOTE)         Prediabetes: 5.7 - 6.4         Diabetes: >6.4         Glycemic control for adults with diabetes: <7.0     CBG: Recent Labs  Lab 11/30/17 0551 11/30/17 0653 11/30/17 0741 11/30/17 1121 11/30/17 1547  GLUCAP 105* 129* 142* 199* 195*    Recent Results (from the past 240 hour(s))  Blood Culture (routine x 2)     Status: None (Preliminary result)   Collection Time: 11/29/17  7:00 PM  Result Value Ref Range Status   Specimen Description BLOOD RIGHT ARM  Final   Special Requests   Final    BOTTLES DRAWN AEROBIC AND ANAEROBIC Blood Culture adequate volume   Culture   Final    NO GROWTH < 24 HOURS Performed at [facility], [address] [place], [place] [address]    Report Status PENDING  Incomplete  Blood Culture (routine x 2)     Status: None (Preliminary result)   Collection Time: 11/29/17  7:35 PM  Result Value Ref Range Status   Specimen Description BLOOD LEFT ANTECUBITAL  Final   Special Requests   Final    BOTTLES DRAWN AEROBIC AND ANAEROBIC Blood Culture results may not be optimal due to an inadequate volume of blood received in culture bottles   Culture   Final    NO GROWTH < 24 HOURS Performed at [facility], [address] [place], [place] [address]    Report Status PENDING  Incomplete  MRSA PCR Screening     Status: None   Collection Time: 11/29/17 11:46 PM  Result Value Ref Range Status   MRSA by PCR NEGATIVE  NEGATIVE Final    Comment:        The GeneXpert MRSA Assay (FDA approved for NASAL specimens only), is one component of a comprehensive MRSA colonization surveillance program. It is not intended to diagnose MRSA infection nor to guide or monitor treatment for MRSA infections. Performed at [facility], [address] [place], [place] [address]  Respiratory Panel by PCR     Status: None   Collection Time: 11/30/17 12:05 AM  Result Value Ref Range Status   Adenovirus NOT DETECTED NOT DETECTED Final   Coronavirus 229E NOT DETECTED NOT DETECTED Final   Coronavirus HKU1 NOT DETECTED NOT DETECTED Final   Coronavirus NL63 NOT DETECTED NOT DETECTED Final   Coronavirus OC43 NOT DETECTED NOT DETECTED Final   Metapneumovirus NOT DETECTED NOT DETECTED Final   Rhinovirus / Enterovirus NOT DETECTED NOT DETECTED Final   Influenza A NOT DETECTED NOT DETECTED Final   Influenza B NOT DETECTED NOT DETECTED Final   Parainfluenza Virus 1 NOT DETECTED NOT DETECTED Final   Parainfluenza Virus 2 NOT DETECTED NOT DETECTED Final   Parainfluenza Virus 3 NOT DETECTED NOT DETECTED Final   Parainfluenza Virus 4 NOT DETECTED NOT DETECTED Final   Respiratory Syncytial Virus NOT DETECTED NOT DETECTED Final   Bordetella pertussis NOT DETECTED NOT DETECTED Final   Chlamydophila pneumoniae NOT DETECTED NOT DETECTED Final   Mycoplasma pneumoniae NOT DETECTED NOT DETECTED Final    Comment: Performed at [facility], [address] [place], [place] [address]     Scheduled Meds: . apixaban  5 mg Oral BID  . fenofibrate  160 mg Oral Daily  . finasteride  5 mg Oral Daily  . FLUoxetine  20 mg Oral Daily  . fluticasone  2 spray Each Nare Daily  . insulin aspart  0-15 Units Subcutaneous TID WC  . insulin aspart  0-5 Units Subcutaneous QHS  . loratadine  10 mg Oral Daily  . oxybutynin  10 mg Oral QHS  . pantoprazole  40 mg Oral BID  . pravastatin  80 mg Oral Daily  . tamsulosin  0.4 mg Oral Daily    Continuous Infusions: . ceFEPime (MAXIPIME) IV    . vancomycin       LOS: 1 day   Lonia Blood, MD [facility] Office  [phone] Pager - Text Page per Amion  If 7PM-7AM, please contact night-coverage per Amion 11/30/2017, 4:35 PM

## 2017-11-30 NOTE — Progress Notes (Signed)
Patient's lactic acid level is 2.7. [place] notified.

## 2017-11-30 NOTE — Progress Notes (Signed)
Pharmacy Antibiotic Note  Joshua BowensRaymond Dalton Joshua Dalton is a 77 y.o. male with a PMH of CKD, CAD, DVT, MI, T2DM admitted on 11/29/2017 with sepsis.  Pharmacy has been consulted for vancomycin and cefepime dosing. Renal function has now improved close to baseline at 2.11, cultures sent, WBC and lactate normalized.  Plan: Continue cefepime 1g IV q24h Adjust vancomycin to 1250mg  IV q24h Monitor renal function, LOT, cultures Vancomycin trough as needed   Height: 5\' 6"  (167.6 cm) Weight: 231 lb 7.7 oz (105 kg) IBW/kg (Calculated) : 63.8  Temp (24hrs), Avg:98.7 F (37.1 C), Min:97.6 F (36.4 C), Max:101.9 F (38.8 C)  Recent Labs  Lab 11/29/17 1929 11/29/17 1945 11/29/17 2221 11/30/17 0220  WBC 13.2*  --   --  11.9*  CREATININE 2.74*  --  2.37* 2.11*  LATICACIDVEN  --  5.21* 2.7* 1.6    Estimated Creatinine Clearance: 32.8 mL/min (A) (by C-G formula based on SCr of 2.11 mg/dL (H)).    No Known Allergies  Antimicrobials this admission: Vancomycin 8/29 >>  Cefepime 8/29 >>   Dose adjustments this admission: 8/30: CrCl >30 - adjust vancomycin to 1250mg  IV q24h  Microbiology results: 8/29 MRSA: neg 8/29 BCx: sent 8/29 UCx: sent 8/29 Resp PCR: neg   Fredonia HighlandMichael Grayson Pfefferle, PharmD, BCPS Clinical Pharmacist [phone] Please check AMION for all [facility] numbers 11/30/2017

## 2017-11-30 NOTE — Progress Notes (Addendum)
Inpatient Diabetes Program Recommendations  AACE/ADA: New Consensus Statement on Inpatient Glycemic Control (2015)  Target Ranges:  Prepandial:   less than 140 mg/dL      Peak postprandial:   less than 180 mg/dL (1-2 hours)      Critically ill patients:  140 - 180 mg/dL   Lab Results  Component Value Date   GLUCAP 142 (H) 11/30/2017   HGBA1C 13.0 (H) 11/30/2017    Review of Glycemic Control Results for Joshua BowensHOMPSON, Joshua Dalton (MRN [id number]) as of 11/30/2017 09:09  Ref. Range 11/30/2017 04:45 11/30/2017 05:51 11/30/2017 06:53 11/30/2017 07:41  Glucose-Capillary Latest Ref Range: 70 - 99 mg/dL 413117 (H) 244105 (H) 010129 (H) 142 (H)   Diabetes history: Type 2 DM Outpatient Diabetes medications: Lantus 75 units QHS, Novolog 20 units TID Current orders for Inpatient glycemic control: Novolog 0-15 units TID, Novolog 0-5 units QHS  Inpatient Diabetes Program Recommendations:    Noted patient was transitioned off insulin drip, came in in DKA. Total needs while on the drip were close to 30 units.   Anticipating patient will need portion of basal insulin based of needs while on drip. Consider adding Lantus 22 units QHS.   Will plan to see patient and family today.   Thanks, Lujean RaveLauren Lular Letson, MSN, RNC-OB Diabetes Coordinator [phone] (8a-5p)  Addendum@1700 : Spoke with patient regarding home management. Patient verifies taking insulin, however, doses were not the same as prescribed. Patient states that doses were increased by PCP 2 weeks ago, however, these doses were not consistent with the note from 11/14/17. Per provider patient was supposed to be taking Lantus 45 units QHS, and Novolog 10 units TID. 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 In talking with the patient, he self administers all medications, however his daughter prepares the pill packs. He uses insulin pens. Patient denies having symptoms of  low blood sugars and does not inject into same site. Reviewed patient's current A1c of 13.0% . Explained what a A1c is and what it measures. Also reviewed goal A1c with patient, importance of good glucose control @ home, and blood sugar goals.  Patient reports checking BS once per day.   Dr Sharon SellerMcClung at bedside. Reviewed with him plan of care to include recommendations that were based on weight. Discussed level of cognition and feel that patient will need help with self administering insulin at discharge. At this point, recommending an increased level of care.  Thanks, Lujean RaveLauren Carlicia Leavens, MSN, RNC-OB Diabetes Coordinator [phone] (8a-5p)

## 2017-12-01 ENCOUNTER — Inpatient Hospital Stay (HOSPITAL_COMMUNITY): Payer: Medicare Other

## 2017-12-01 DIAGNOSIS — N179 Acute kidney failure, unspecified: Secondary | ICD-10-CM

## 2017-12-01 DIAGNOSIS — N400 Enlarged prostate without lower urinary tract symptoms: Secondary | ICD-10-CM

## 2017-12-01 DIAGNOSIS — Z86711 Personal history of pulmonary embolism: Secondary | ICD-10-CM

## 2017-12-01 DIAGNOSIS — Z7901 Long term (current) use of anticoagulants: Secondary | ICD-10-CM

## 2017-12-01 DIAGNOSIS — E1110 Type 2 diabetes mellitus with ketoacidosis without coma: Principal | ICD-10-CM

## 2017-12-01 DIAGNOSIS — G4733 Obstructive sleep apnea (adult) (pediatric): Secondary | ICD-10-CM

## 2017-12-01 DIAGNOSIS — N189 Chronic kidney disease, unspecified: Secondary | ICD-10-CM

## 2017-12-01 DIAGNOSIS — R6510 Systemic inflammatory response syndrome (SIRS) of non-infectious origin without acute organ dysfunction: Secondary | ICD-10-CM

## 2017-12-01 DIAGNOSIS — R1310 Dysphagia, unspecified: Secondary | ICD-10-CM

## 2017-12-01 DIAGNOSIS — Z86718 Personal history of other venous thrombosis and embolism: Secondary | ICD-10-CM

## 2017-12-01 LAB — COMPREHENSIVE METABOLIC PANEL
ALBUMIN: 2.7 g/dL — AB (ref 3.5–5.0)
ALK PHOS: 71 U/L (ref 38–126)
ALT: 22 U/L (ref 0–44)
AST: 33 U/L (ref 15–41)
Anion gap: 6 (ref 5–15)
BUN: 18 mg/dL (ref 8–23)
CALCIUM: 8.7 mg/dL — AB (ref 8.9–10.3)
CHLORIDE: 107 mmol/L (ref 98–111)
CO2: 25 mmol/L (ref 22–32)
CREATININE: 1.91 mg/dL — AB (ref 0.61–1.24)
GFR calc non Af Amer: 32 mL/min — ABNORMAL LOW (ref >60)
GFR, EST AFRICAN AMERICAN: 37 mL/min — AB (ref >60)
Glucose, Bld: 218 mg/dL — ABNORMAL HIGH (ref 70–99)
Potassium: 4 mmol/L (ref 3.5–5.1)
Sodium: 138 mmol/L (ref 135–145)
Total Bilirubin: 0.9 mg/dL (ref 0.3–1.2)
Total Protein: 5.4 g/dL — ABNORMAL LOW (ref 6.5–8.1)

## 2017-12-01 LAB — CBC
HCT: 43.1 % (ref 39.0–52.0)
Hemoglobin: 14.2 g/dL (ref 13.0–17.0)
MCH: 32.2 pg (ref 26.0–34.0)
MCHC: 32.9 g/dL (ref 30.0–36.0)
MCV: 97.7 fL (ref 78.0–100.0)
PLATELETS: 201 10*3/uL (ref 150–400)
RBC: 4.41 MIL/uL (ref 4.22–5.81)
RDW: 13.5 % (ref 11.5–15.5)
WBC: 9.3 10*3/uL (ref 4.0–10.5)

## 2017-12-01 LAB — URINE CULTURE

## 2017-12-01 LAB — GLUCOSE, CAPILLARY
GLUCOSE-CAPILLARY: 205 mg/dL — AB (ref 70–99)
GLUCOSE-CAPILLARY: 198 mg/dL — AB (ref 70–99)
Glucose-Capillary: 213 mg/dL — ABNORMAL HIGH (ref 70–99)
Glucose-Capillary: 252 mg/dL — ABNORMAL HIGH (ref 70–99)

## 2017-12-01 LAB — LACTIC ACID, PLASMA: LACTIC ACID, VENOUS: 1 mmol/L (ref 0.5–1.9)

## 2017-12-01 MED ORDER — INSULIN ASPART 100 UNIT/ML ~~LOC~~ SOLN
0.0000 [IU] | Freq: Three times a day (TID) | SUBCUTANEOUS | Status: DC
  Administered 2017-12-02: 4 [IU] via SUBCUTANEOUS
  Administered 2017-12-02: 7 [IU] via SUBCUTANEOUS

## 2017-12-01 MED ORDER — INSULIN ASPART 100 UNIT/ML ~~LOC~~ SOLN: 0.0000 [IU] | Freq: Every day | SUBCUTANEOUS | Status: DC

## 2017-12-01 MED ORDER — NYSTATIN 100000 UNIT/GM EX POWD
Freq: Two times a day (BID) | CUTANEOUS | Status: DC
  Administered 2017-12-01 – 2017-12-02 (×3): via TOPICAL
  Filled 2017-12-01: qty 15

## 2017-12-01 NOTE — Evaluation (Signed)
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  Patient reports that he did not fall, but had gotten down to get a piece of tape stuck between the seats and was unable to get back up.  Patient presents with generalized weakness and hyperglycemia  Clinical Impression  Patient presents with dependencies in gait and mobility due to right hip pain and generalized weakness.  Pt very talkative during session and self reports he cannot "remember things".  Pt will benefit from continued PT to increase mobility, balance and independence.  Feel patient will be able to return to current living situation from a mobility standpoint.      Follow Up Recommendations Home health PT    Equipment Recommendations  Rolling walker with 5" wheels    Recommendations for Other Services       Precautions / Restrictions Precautions Precautions: Fall      Mobility  Bed Mobility Overal bed mobility: Needs Assistance Bed Mobility: Sit to Supine       Sit to supine: Min assist   General bed mobility comments: assist to place LE's on bed  Transfers Overall transfer level: Needs assistance Equipment used: Rolling walker (2 wheeled) Transfers: Sit to/from Stand Sit to Stand: Min assist         General transfer comment: assist to power up  Ambulation/Gait Ambulation/Gait assistance: Min assist Gait Distance (Feet): 10 Feet Assistive device: Rolling walker (2 wheeled) Gait Pattern/deviations: Step-to pattern;Antalgic;Decreased weight shift to right        Careers information officertairs            Wheelchair Mobility    Modified Rankin (Stroke Patients Only)       Balance Overall balance assessment: Needs  assistance Sitting-balance support: Feet supported;No upper extremity supported Sitting balance-Leahy Scale: Normal     Standing balance support: During functional activity;Bilateral upper extremity supported Standing balance-Leahy Scale: Poor Standing balance comment: reliant on RW                             Pertinent Vitals/Pain Pain Assessment: 0-10 Pain Score: 5  Pain Location: right hip Pain Descriptors / Indicators: Aching;Sharp Pain Intervention(s): Limited activity within patient's tolerance;Monitored during session    Home Living Family/patient expects to be discharged to:: Other (Comment)                 Additional Comments: [facility]    Prior Function Level of Independence: Independent         Comments: Intermittent use of RW for mobility. Meals provided at dining room or can be brought to room if needed; per family more memory issues recently     Hand Dominance        Extremity/Trunk Assessment   Upper Extremity Assessment Upper Extremity Assessment: Defer to OT evaluation    Lower Extremity Assessment Lower Extremity Assessment: RLE deficits/detail;LLE deficits/detail RLE Deficits / Details: knee extension 4/5, ankle dorsiflexion 5/5, unable to test hip due to pain RLE: Unable to fully assess due to pain LLE Deficits / Details: knee extension 4/5, ankle dorsiflexion 5/5, unable to test hip due to pain in right hip LLE: Unable to fully assess due to pain  Communication   Communication: No difficulties  Cognition Arousal/Alertness: Awake/alert Behavior During Therapy: WFL for tasks assessed/performed Overall Cognitive Status: No family/caregiver present to determine baseline cognitive functioning                                 General Comments: per family has had memory issues recently; patient reports "I can't remember things"; daughter manages meds      General Comments      Exercises      Assessment/Plan    PT Assessment Patient needs continued PT services  PT Problem List Decreased strength;Decreased activity tolerance;Decreased balance;Decreased mobility;Decreased cognition       PT Treatment Interventions DME instruction;Gait training;Functional mobility training;Balance training;Therapeutic exercise;Therapeutic activities;Patient/family education    PT Goals (Current goals can be found in the Care Plan section)  Acute Rehab PT Goals Patient Stated Goal: none obtained PT Goal Formulation: With patient Time For Goal Achievement: 12/08/17 Potential to Achieve Goals: Good    Frequency Min 3X/week   Barriers to discharge        Co-evaluation               AM-PAC PT "6 Clicks" Daily Activity  Outcome Measure Difficulty turning over in bed (including adjusting bedclothes, sheets and blankets)?: A Lot Difficulty moving from lying on back to sitting on the side of the bed? : A Lot Difficulty sitting down on and standing up from a chair with arms (e.g., wheelchair, bedside commode, etc,.)?: Unable Help needed moving to and from a bed to chair (including a wheelchair)?: A Little Help needed walking in hospital room?: A Little Help needed climbing 3-5 steps with a railing? : A Lot 6 Click Score: 13    End of Session Equipment Utilized During Treatment: Gait belt Activity Tolerance: Patient tolerated treatment well;Patient limited by pain Patient left: in bed;with call bell/phone within reach;with bed alarm set   PT Visit Diagnosis: Other abnormalities of gait and mobility (R26.89);Muscle weakness (generalized) (M62.81)    Time: 1325-1400 PT Time Calculation (min) (ACUTE ONLY): 35 min   Charges:   PT Evaluation $PT Eval Moderate Complexity: 1 Mod PT Treatments $Gait Training: 23-37 mins        12/01/2017 Corlis Hove, PT Acute Rehabilitation Services Pager:  [phone] Office:  [phone]    Olivia Canter 12/01/2017, 2:10 PM

## 2017-12-01 NOTE — Evaluation (Signed)
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  Patient reports that he did not fall, but had gotten down to get a piece of tape stuck between the seats and was unable to get back up.  Patient presents with generalized weakness and hyperglycemia   Clinical Impression   PTA pt living at independent living [facility]). Pt is currently mod A for LB ADL due to pain in R hip, set up for grooming/eating. Min guard for bed mobility. Ambulation and transfers not tested this session due to recent testing with PT and Pt is pending Xray of hip results. OT did administer MOCA cognitive assessment this session. 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 Area of weakness were: visuospatial/executive thinking (2/5 points), Language (1/3 points), Abstraction (1/2 points). Pt will benefit from skilled OT in the acute setting and afterwards at [facility] to maximize safety and independence in ADL and functional transfers as well as cognition. Next session focus on AE education and higher executive level skills/compensatory strategies.    Follow Up Recommendations  Home health OT    Equipment Recommendations       Recommendations for Other Services Speech consult(cognitive evaluation)     Precautions / Restrictions Precautions Precautions: Fall Restrictions Weight Bearing Restrictions: No      Mobility Bed Mobility Overal bed mobility: Needs Assistance Bed Mobility: Supine to Sit;Sit to Supine     Supine to sit: Min guard(use of bed rail) Sit to supine: Min guard(increased time and effort)   General bed mobility comments: Pt able to complete with assist from bed rail  Transfers Overall  transfer level: Needs assistance Equipment used: Rolling walker (2 wheeled) Transfers: Sit to/from Stand Sit to Stand: Min assist         General transfer comment: deferred as Pt has already worked with PT and Xray results are pending    Balance Overall balance assessment: Needs assistance Sitting-balance support: Feet supported;No upper extremity supported Sitting balance-Leahy Scale: Normal     Standing balance support: During functional activity;Bilateral upper extremity supported Standing balance-Leahy Scale: Poor Standing balance comment: reliant on RW                           ADL either performed or assessed with clinical judgement   ADL Overall ADL's : Needs assistance/impaired Eating/Feeding: Modified independent   Grooming: Set up;Sitting Grooming Details (indicate cue type and reason): not tested in standing due to pending xray results Upper Body Bathing: Supervision/ safety;Sitting   Lower Body Bathing: Moderate assistance;Sit to/from stand Lower Body Bathing Details (indicate cue type and reason): pain limits access to LB Upper Body Dressing : Modified independent   Lower Body Dressing: Moderate assistance Lower Body Dressing Details (indicate cue type and reason): pain from hip limits access to LB   Toilet Transfer Details (indicate cue type and reason): deferred to wait on results from Xray         Functional mobility during ADLs: (deferred, see PT note - OT waiting on Xray results)       Vision Baseline Vision/History: Wears glasses Patient Visual Report: No change from baseline Vision Assessment?: No apparent visual deficits     Perception  Praxis      Pertinent Vitals/Pain Pain Assessment: 0-10 Pain Score: 5  Pain Location: right hip Pain Descriptors / Indicators: Aching;Sharp Pain Intervention(s): Limited activity within patient's tolerance;Repositioned     Hand Dominance Right   Extremity/Trunk Assessment Upper  Extremity Assessment Upper Extremity Assessment: Overall WFL for tasks assessed   Lower Extremity Assessment Lower Extremity Assessment: Defer to PT evaluation RLE Deficits / Details: knee extension 4/5, ankle dorsiflexion 5/5, unable to test hip due to pain RLE: Unable to fully assess due to pain LLE Deficits / Details: knee extension 4/5, ankle dorsiflexion 5/5, unable to test hip due to pain in right hip LLE: Unable to fully assess due to pain       Communication Communication Communication: No difficulties   Cognition Arousal/Alertness: Awake/alert Behavior During Therapy: WFL for tasks assessed/performed Overall Cognitive Status: Impaired/Different from baseline Area of Impairment: Safety/judgement;Awareness;Memory                     Memory: Decreased short-term memory   Safety/Judgement: Decreased awareness of safety Awareness: Emergent   General Comments: Pt was given MOCA and scored 22/30 indicating mild cognitive impairment   General Comments       Exercises     Shoulder Instructions      Home Living Family/patient expects to be discharged to:: Other (Comment)                                 Additional Comments: Lives at [facility]      Prior Functioning/Environment Level of Independence: Independent        Comments: Intermittent use of RW for mobility. Meals provided at dining room or can be brought to room if needed; per family more memory issues recently. Pt enjoys restoring old photographs.        OT Problem List: Decreased activity tolerance;Decreased cognition;Decreased knowledge of use of DME or AE;Obesity;Pain      OT Treatment/Interventions: DME and/or AE instruction;Therapeutic activities;Cognitive remediation/compensation;Patient/family education;Balance training    OT Goals(Current goals can be found in the care plan section) Acute Rehab OT Goals Patient Stated Goal: to always challenge himself, staying  independent OT Goal Formulation: With patient Time For Goal Achievement: 12/15/17 Potential to Achieve Goals: Good ADL Goals Pt Will Perform Grooming: with modified independence;standing Pt Will Perform Lower Body Bathing: with modified independence;sitting/lateral leans Pt Will Perform Lower Body Dressing: with modified independence;sit to/from stand Pt Will Transfer to Toilet: with modified independence;ambulating Pt Will Perform Toileting - Clothing Manipulation and hygiene: with modified independence;sit to/from stand Additional ADL Goal #1: Pt will implement cognitive strategies for memory and executive functioning at independent level.  OT Frequency: Min 2X/week   Barriers to D/C:            Co-evaluation              AM-PAC PT "6 Clicks" Daily Activity     Outcome Measure Help from another person eating meals?: None Help from another person taking care of personal grooming?: A Little Help from another person toileting, which includes using toliet, bedpan, or urinal?: A Little Help from another person bathing (including washing, rinsing, drying)?: A Lot Help from another person to put on and taking off regular upper body clothing?: None Help from another person to put on and taking off regular lower body clothing?: A Lot 6 Click Score: 18   End of Session Nurse Communication: Mobility  status  Activity Tolerance: Patient tolerated treatment well Patient left: in bed;with call bell/phone within reach  OT Visit Diagnosis: Pain;Other symptoms and signs involving cognitive function;History of falling (Z91.81) Pain - Right/Left: Right Pain - part of body: Hip                Time: [id number] OT Time Calculation (min): 31 min Charges:  OT General Charges $OT Visit: 1 Visit OT Evaluation $OT Eval Moderate Complexity: 1 Mod OT Treatments $Self Care/Home Management : 8-22 mins  Sherryl MangesLaura Marcina Kinnison OTR/L Acute Rehabilitation Services Pager: [phone] Office:  [phone]  Joshua BioLaura J Viva Dalton 12/01/2017, 3:38 PM

## 2017-12-01 NOTE — Plan of Care (Signed)
  Problem: Activity: Goal: Risk for activity intolerance will decrease Outcome: Progressing   

## 2017-12-01 NOTE — Progress Notes (Signed)
PROGRESS NOTE    Joshua Dalton  ZOX:[id number] DOB: 08-26-1940 DOA: 11/29/2017 PCP: Annita Brod, MD  Brief Narrative:  The patient is a 77 y.o.malewith a hx ofHTN, HLD,DM2, DVT/PEon Eliquis, andCKD stage III;who presents after reportedly having a possible fall v/s an episode of severe weakness and confusion.  After arrival in the ED he was found to have a temp of 101.9 F and pulse up to 123. WBC 13.2, creatinine 2.74, glucose 589, andlactic acid 5.21.Urinalysis was w/o signs of infection. Chest x-ray was w/o infiltrate or edema. Currently being evaluated for ? Syncope and being monitored for SIRS. Complained of Right Hip Pain so will obtain Right Hip X-Ray.   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 No active pulmonary disease.  -UA not c/w UTI - no clinical evidence of an active infection  -Lactate has normalized - stopped abx and follow clinically  -Afebrile now and WBC is improved -LA is 1.0  -Continue to Monitor   Possible Fall/Syncope -Patient denies, but appears to have little insight into his health -Check Head CT -Check ECHOCardiogram -PT/OT Recommending Home Health PT -Check Right Hip X-Ray as patient is complaining of Pain -Check Orthostatic Vital Signs now that he has been Hydrated   Right Hip Pain -Check Hip X-Ray  -PT/OT to Evaluate and Treat and recommending Home Health PT and [place]  Uncontrolled DM2 - DKA -HBA1c 13.0 - unclear if he had true DKA at admit, or severely elevated CBG  as well as lactic acidosis -  -Regardless has now improved - titrate insulin tx - appears he will need help w/ insulin dosing as outpt  -Increased Moderate Novolog SSI AC/HS to Resistant Scale and will continue Lantus 22 units sq qHS -CBG's ranging from 142-252 -Diabetes Education Coordinator Consulted for further evaluation and Management   Acute kidney injury on CKD StageIII -Baseline creatinine around 2  -Given IVF Hydration and Cr improved to Baseline and is now 1.91 -Continue to Monitor Renal Function Trend and Avoid Nephrotoxic Medications if possible -Repeat CMP in AM   History of DVT/PE on Chronic Anticoagulation -Continue Apixaban 5 mg po BID  Chronic Diastolic Congestive Heart Failure -EF 60-65% with grade 1 dysfunction via TTE 01/2017  -No significant volume overload on exam -Strict I's O's and Daily Weights   Mild Cognitive Impairment -Family report progressively worsening memory / getting lost  -Will get Head CT for ? Fall and Syncope along with Cognitive Impairment  -Will need Neurology Follow up   Esophageal stricture > Dysphagia -Patient to have repeat esophageal dilation as outpt   Depression and Anxiety -C/w Fluoxetine 20 mg po qHS  BPH -Continue Tamsulosin 0.4 po Daily and Finasteride 5 mg po Daily   Obstructive Sleep Apnea -on 2 L oxygen at night + CPAP  Dyslipidemia -Continue Fenofibrate and Pravastatin 80 mg po Daily  GERD -Continue Pantoprazole 40 mg po BID  DVT prophylaxis: Anticoagulated with Apixaban  Code Status: FULL CODE Family Communication: No family present at bedside Disposition Plan: Home Health PT/OT with RW with  5" Wheels when medically stable likely in the next 24-48 hours  Consultants:   None   Procedures: None    Antimicrobials: Anti-infectives (From admission, onward)   Start     Dose/Rate Route Frequency Ordered Stop   12/01/17 2031  vancomycin (VANCOCIN) 1,500 mg in sodium chloride 0.9 % 500 mL IVPB   Status:  Discontinued     1,500 mg 250 mL/hr over 120 Minutes Intravenous Every 48 hours 11/29/17 2035 11/30/17 1007   11/30/17 2030  ceFEPIme (MAXIPIME) 1 g in sodium chloride 0.9 % 100 mL IVPB  Status:  Discontinued     1 g 200 mL/hr over 30 Minutes Intravenous Every 24 hours 11/29/17 2035 11/30/17 1651   11/30/17 2030  vancomycin (VANCOCIN) 1,250 mg in sodium chloride 0.9 % 250 mL IVPB  Status:  Discontinued     1,250 mg 166.7 mL/hr over 90 Minutes Intravenous Every 24 hours 11/30/17 1007 11/30/17 1651   11/29/17 1930  vancomycin (VANCOCIN) 2,000 mg in sodium chloride 0.9 % 500 mL IVPB     2,000 mg 250 mL/hr over 120 Minutes Intravenous To Emergency Dept 11/29/17 1921 11/29/17 2231   11/29/17 1915  ceFEPIme (MAXIPIME) 2 g in sodium chloride 0.9 % 100 mL IVPB     2 g 200 mL/hr over 30 Minutes Intravenous  Once 11/29/17 1904 11/29/17 2031   11/29/17 1915  metroNIDAZOLE (FLAGYL) IVPB 500 mg  Status:  Discontinued     500 mg 100 mL/hr over 60 Minutes Intravenous Every 8 hours 11/29/17 1904 11/29/17 2258   11/29/17 1915  vancomycin (VANCOCIN) IVPB 1000 mg/200 mL premix  Status:  Discontinued     1,000 mg 200 mL/hr over 60 Minutes Intravenous  Once 11/29/17 1904 11/29/17 1921     Subjective: Seen and examined at bedside and states that his right hip was hurting.  Denies chest pain, shortness breath, nausea, vomiting or urinary discomfort or burning.  States that his memory is not the best and states that he is forgetting things more.  No other concerns or complaints at this time.  Blood sugars are out of control and he understands that he needs to do better.  Objective: Vitals:   11/30/17 2100 11/30/17 2142 12/01/17 0030 12/01/17 0500  BP: 129/69  125/61   Pulse: (!) 58 (!) 59 (!) 58   Resp: 16 16 16    Temp: 98.3 F (36.8 C)  98.1 F (36.7 C)   TempSrc: Oral  Axillary   SpO2: 100% 99% 98%   Weight:    103.6 kg  Height:        Intake/Output Summary (Last 24 hours) at 12/01/2017  0751 Last data filed at 11/30/2017 1700 Gross per 24 hour  Intake 840 ml  Output 850 ml  Net -10 ml   Filed Weights   11/29/17 1901 11/30/17 0300 12/01/17 0500  Weight: 100.7 kg 105 kg 103.6 kg   Examination: Physical Exam:  Constitutional: WN/WD obese Caucasian male in NAD and appears calm Eyes: Lids and conjunctivae normal, sclerae anicteric  ENMT: External Ears, Nose appear normal. Grossly normal hearing. Mucous membranes are moist.  Neck: Appears normal, supple, no cervical masses, normal ROM, no appreciable thyromegaly, no JVD Respiratory: Diminished to auscultation bilaterally, no wheezing, rales, rhonchi or crackles. Normal respiratory effort and patient is not tachypenic. No accessory muscle use.  Cardiovascular: RRR, no murmurs / rubs / gallops. S1 and S2 auscultated. No extremity edema. Abdomen: Soft, non-tender, Distended 2/2 to body habitus. No masses palpated. No  appreciable hepatosplenomegaly. Bowel sounds positive x4.  GU: Deferred. Musculoskeletal: No clubbing / cyanosis of digits/nails. Right Hip Painful to palpation Skin: No rashes, but has intertrigo in pannus fold. No induration; Warm and dry.  Neurologic: CN 2-12 grossly intact with no focal deficits. Romberg sign and cerebellar reflexes not assessed.  Psychiatric: Normal judgment and insight. Alert and oriented x 3. Normal mood and appropriate affect.   Data Reviewed: I have personally reviewed following labs and imaging studies  CBC: Recent Labs  Lab 11/29/17 1929 11/30/17 0220 12/01/17 0453  WBC 13.2* 11.9* 9.3  NEUTROABS 11.2*  --   --   HGB 16.8 14.3 14.2  HCT 48.6 42.4 43.1  MCV 95.7 96.6 97.7  PLT 276 225 201   Basic Metabolic Panel: Recent Labs  Lab 11/29/17 1929 11/29/17 2221 11/30/17 0220 12/01/17 0453  NA 133* 139 141 138  K 3.7 4.0 3.5 4.0  CL 97* 109 112* 107  CO2 19* 21* 24 25  GLUCOSE 589* 338* 187* 218*  BUN 25* 22 20 18   CREATININE 2.74* 2.37* 2.11* 1.91*  CALCIUM 9.7 8.2*  8.5* 8.7*  MG  --   --  1.8  --    GFR: Estimated Creatinine Clearance: 35.9 mL/min (A) (by C-G formula based on SCr of 1.91 mg/dL (H)). Liver Function Tests: Recent Labs  Lab 11/29/17 1929 12/01/17 0453  AST 38 33  ALT 25 22  ALKPHOS 97 71  BILITOT 1.0 0.9  PROT 6.5 5.4*  ALBUMIN 3.7 2.7*   No results for input(s): LIPASE, AMYLASE in the last 168 hours. No results for input(s): AMMONIA in the last 168 hours. Coagulation Profile: No results for input(s): INR, PROTIME in the last 168 hours. Cardiac Enzymes: No results for input(s): CKTOTAL, CKMB, CKMBINDEX, TROPONINI in the last 168 hours. BNP (last 3 results) Recent Labs    02/06/17 0000  PROBNP 146   HbA1C: Recent Labs    11/30/17 0220  HGBA1C 13.0*   CBG: Recent Labs  Lab 11/30/17 0653 11/30/17 0741 11/30/17 1121 11/30/17 1547 11/30/17 2058  GLUCAP 129* 142* 199* 195* 234*   Lipid Profile: No results for input(s): CHOL, HDL, LDLCALC, TRIG, CHOLHDL, LDLDIRECT in the last 72 hours. Thyroid Function Tests: No results for input(s): TSH, T4TOTAL, FREET4, T3FREE, THYROIDAB in the last 72 hours. Anemia Panel: No results for input(s): VITAMINB12, FOLATE, FERRITIN, TIBC, IRON, RETICCTPCT in the last 72 hours. Sepsis Labs: Recent Labs  Lab 11/29/17 1945 11/29/17 2221 11/30/17 0220 12/01/17 0453  PROCALCITON  --  0.10  --   --   LATICACIDVEN 5.21* 2.7* 1.6 1.0   Recent Results (from the past 240 hour(s))  Blood Culture (routine x 2)     Status: None (Preliminary result)   Collection Time: 11/29/17  7:00 PM  Result Value Ref Range Status   Specimen Description BLOOD RIGHT ARM  Final   Special Requests   Final    BOTTLES DRAWN AEROBIC AND ANAEROBIC Blood Culture adequate volume   Culture   Final    NO GROWTH < 24 HOURS Performed at [facility], [address], [place], [place] [address]    Report Status PENDING  Incomplete  Blood Culture (routine x 2)     Status: None (Preliminary result)    Collection Time: 11/29/17  7:35 PM  Result Value Ref Range Status   Specimen Description BLOOD LEFT ANTECUBITAL  Final   Special Requests   Final    BOTTLES DRAWN AEROBIC AND ANAEROBIC Blood  Culture results may not be optimal due to an inadequate volume of blood received in culture bottles   Culture   Final    NO GROWTH < 24 HOURS Performed at [facility], [address], [place], [place] [address]    Report Status PENDING  Incomplete  Urine Culture     Status: Abnormal   Collection Time: 11/29/17  9:50 PM  Result Value Ref Range Status   Specimen Description URINE, CLEAN CATCH  Final   Special Requests   Final    NONE Performed at [facility], [address] [place], [place] [address]    Culture MULTIPLE SPECIES PRESENT, SUGGEST RECOLLECTION (A)  Final   Report Status 12/01/2017 FINAL  Final  MRSA PCR Screening     Status: None   Collection Time: 11/29/17 11:46 PM  Result Value Ref Range Status   MRSA by PCR NEGATIVE NEGATIVE Final    Comment:        The GeneXpert MRSA Assay (FDA approved for NASAL specimens only), is one component of a comprehensive MRSA colonization surveillance program. It is not intended to diagnose MRSA infection nor to guide or monitor treatment for MRSA infections. Performed at [facility], [address] [place], [place] [address]   Respiratory Panel by PCR     Status: None   Collection Time: 11/30/17 12:05 AM  Result Value Ref Range Status   Adenovirus NOT DETECTED NOT DETECTED Final   Coronavirus 229E NOT DETECTED NOT DETECTED Final   Coronavirus HKU1 NOT DETECTED NOT DETECTED Final   Coronavirus NL63 NOT DETECTED NOT DETECTED Final   Coronavirus OC43 NOT DETECTED NOT DETECTED Final   Metapneumovirus NOT DETECTED NOT DETECTED Final   Rhinovirus / Enterovirus NOT DETECTED NOT DETECTED Final   Influenza A NOT DETECTED NOT DETECTED Final   Influenza B NOT DETECTED NOT DETECTED Final   Parainfluenza Virus 1 NOT DETECTED  NOT DETECTED Final   Parainfluenza Virus 2 NOT DETECTED NOT DETECTED Final   Parainfluenza Virus 3 NOT DETECTED NOT DETECTED Final   Parainfluenza Virus 4 NOT DETECTED NOT DETECTED Final   Respiratory Syncytial Virus NOT DETECTED NOT DETECTED Final   Bordetella pertussis NOT DETECTED NOT DETECTED Final   Chlamydophila pneumoniae NOT DETECTED NOT DETECTED Final   Mycoplasma pneumoniae NOT DETECTED NOT DETECTED Final    Comment: Performed at [facility], [address], [place], [place] [address]    Radiology Studies: Dg Chest 1 View  Result Date: 11/29/2017 CLINICAL DATA:  Patient tripped and fell at car wash. EXAM: CHEST  1 VIEW COMPARISON:  11/08/2017 FINDINGS: AP portable semi upright view of the chest. Borderline cardiomegaly with aortic atherosclerosis. No acute pulmonary consolidation, effusion or pneumothorax. No overt pulmonary edema. No acute osseous abnormality. IMPRESSION: Borderline cardiomegaly with aortic atherosclerosis. No active pulmonary disease. Electronically Signed   By: Tollie Eth M.D.   On: 11/29/2017 20:07   Scheduled Meds: . allopurinol  300 mg Oral Daily  . apixaban  5 mg Oral BID  . fenofibrate  160 mg Oral Daily  . finasteride  5 mg Oral Daily  . FLUoxetine  20 mg Oral Daily  . fluticasone  2 spray Each Nare Daily  . insulin aspart  0-15 Units Subcutaneous TID WC  . insulin aspart  0-5 Units Subcutaneous QHS  . insulin glargine  22 Units Subcutaneous QHS  . loratadine  10 mg Oral Daily  . oxybutynin  10 mg Oral QHS  .  pantoprazole  40 mg Oral BID  . pravastatin  80 mg Oral Daily  . tamsulosin  0.4 mg Oral Daily   Continuous Infusions:   LOS: 2 days   Merlene Laughtermair Latif Jenipher Havel, DO [facility] PAGER is on AMION  If 7PM-7AM, please contact night-coverage [email or web address] Password [email or web address] 12/01/2017, 7:51 AM

## 2017-12-02 ENCOUNTER — Inpatient Hospital Stay (HOSPITAL_COMMUNITY): Payer: Medicare Other

## 2017-12-02 DIAGNOSIS — I351 Nonrheumatic aortic (valve) insufficiency: Secondary | ICD-10-CM

## 2017-12-02 LAB — COMPREHENSIVE METABOLIC PANEL
ALT: 23 U/L (ref 0–44)
AST: 25 U/L (ref 15–41)
Albumin: 2.9 g/dL — ABNORMAL LOW (ref 3.5–5.0)
Alkaline Phosphatase: 80 U/L (ref 38–126)
Anion gap: 8 (ref 5–15)
BUN: 17 mg/dL (ref 8–23)
CHLORIDE: 107 mmol/L (ref 98–111)
CO2: 22 mmol/L (ref 22–32)
Calcium: 9 mg/dL (ref 8.9–10.3)
Creatinine, Ser: 1.54 mg/dL — ABNORMAL HIGH (ref 0.61–1.24)
GFR calc non Af Amer: 41 mL/min — ABNORMAL LOW (ref >60)
GFR, EST AFRICAN AMERICAN: 48 mL/min — AB (ref >60)
Glucose, Bld: 208 mg/dL — ABNORMAL HIGH (ref 70–99)
POTASSIUM: 3.6 mmol/L (ref 3.5–5.1)
Sodium: 137 mmol/L (ref 135–145)
Total Bilirubin: 0.9 mg/dL (ref 0.3–1.2)
Total Protein: 5.8 g/dL — ABNORMAL LOW (ref 6.5–8.1)

## 2017-12-02 LAB — CBC WITH DIFFERENTIAL/PLATELET
ABS IMMATURE GRANULOCYTES: 0 10*3/uL (ref 0.0–0.1)
BASOS ABS: 0 10*3/uL (ref 0.0–0.1)
BASOS PCT: 1 %
EOS ABS: 0.4 10*3/uL (ref 0.0–0.7)
Eosinophils Relative: 5 %
HCT: 44.4 % (ref 39.0–52.0)
Hemoglobin: 14.8 g/dL (ref 13.0–17.0)
IMMATURE GRANULOCYTES: 0 %
Lymphocytes Relative: 21 %
Lymphs Abs: 1.6 10*3/uL (ref 0.7–4.0)
MCH: 32.3 pg (ref 26.0–34.0)
MCHC: 33.3 g/dL (ref 30.0–36.0)
MCV: 96.9 fL (ref 78.0–100.0)
MONOS PCT: 9 %
Monocytes Absolute: 0.7 10*3/uL (ref 0.1–1.0)
NEUTROS ABS: 4.9 10*3/uL (ref 1.7–7.7)
NEUTROS PCT: 64 %
PLATELETS: 202 10*3/uL (ref 150–400)
RBC: 4.58 MIL/uL (ref 4.22–5.81)
RDW: 13.3 % (ref 11.5–15.5)
WBC: 7.6 10*3/uL (ref 4.0–10.5)

## 2017-12-02 LAB — GLUCOSE, CAPILLARY
GLUCOSE-CAPILLARY: 211 mg/dL — AB (ref 70–99)
GLUCOSE-CAPILLARY: 215 mg/dL — AB (ref 70–99)
Glucose-Capillary: 189 mg/dL — ABNORMAL HIGH (ref 70–99)

## 2017-12-02 LAB — PHOSPHORUS: PHOSPHORUS: 2.8 mg/dL (ref 2.5–4.6)

## 2017-12-02 LAB — ECHOCARDIOGRAM COMPLETE
HEIGHTINCHES: 66 in
Weight: 3619.07 oz

## 2017-12-02 LAB — MAGNESIUM: MAGNESIUM: 1.7 mg/dL (ref 1.7–2.4)

## 2017-12-02 MED ORDER — NYSTATIN 100000 UNIT/GM EX POWD: Freq: Two times a day (BID) | CUTANEOUS | 0 refills | Status: AC

## 2017-12-02 MED ORDER — INSULIN GLARGINE 100 UNIT/ML ~~LOC~~ SOLN: 25.0000 [IU] | Freq: Every day | SUBCUTANEOUS | 11 refills | Status: AC

## 2017-12-02 MED ORDER — INSULIN ASPART 100 UNIT/ML ~~LOC~~ SOLN: 5.0000 [IU] | Freq: Three times a day (TID) | SUBCUTANEOUS | 11 refills | Status: AC

## 2017-12-02 MED ORDER — OXYCODONE HCL 5 MG PO TABS: 5.0000 mg | ORAL_TABLET | ORAL | 0 refills | Status: AC | PRN

## 2017-12-02 MED ORDER — INSULIN GLARGINE 100 UNIT/ML ~~LOC~~ SOLN: 22.0000 [IU] | Freq: Every day | SUBCUTANEOUS | 11 refills | Status: DC

## 2017-12-02 MED ORDER — FUROSEMIDE 40 MG PO TABS: 20.0000 mg | ORAL_TABLET | Freq: Every day | ORAL | 0 refills | Status: AC

## 2017-12-02 MED ORDER — ALBUTEROL SULFATE (2.5 MG/3ML) 0.083% IN NEBU: 2.5000 mg | INHALATION_SOLUTION | RESPIRATORY_TRACT | 12 refills | Status: AC | PRN

## 2017-12-02 MED ORDER — PERFLUTREN LIPID MICROSPHERE
INTRAVENOUS | Status: AC
  Administered 2017-12-02: 4 mL
  Administered 2017-12-02: 1 mL
  Filled 2017-12-02: qty 10

## 2017-12-02 NOTE — Progress Notes (Signed)
  Echocardiogram 2D Echocardiogram has been performed.  Eisha Chatterjee T Ruston Fedora 12/02/2017, 11:26 AM

## 2017-12-02 NOTE — Discharge Summary (Signed)
Physician Discharge Summary  Joshua Dalton SXJ:[id number] DOB: 1940/12/25 DOA: 11/29/2017  PCP: Annita Brod, MD  Admit date: 11/29/2017 Discharge date: 12/02/2017  Admitted From: Home (ILF) Disposition: Home with Home Health PT/OT/RN/Aide  Recommendations for Outpatient Follow-up:  1. Follow up with PCP in 1-2 weeks 2. Follow up with Neurology as an outpatient 3. Follow up with Cardiology as an outpatient  4. Follow up with GI for outpatient Dilatation  5. Please obtain CMP/CBC, Mag, Phos in one week 6. Please follow up on the following pending results:  Home Health: Yes Equipment/Devices: Agricultural consultant with 5" Wheels  Discharge Condition: Stable CODE STATUS: FULL CODE Diet recommendation: Heart Health Carb Modified Diet  Brief/Interim Summary: The patient is a 77 y.o.malewitha hxofHTN, HLD,DM2, DVT/PEon Eliquis, andCKD stage III;who presents after reportedly having apossible fall v/s an episode of severe weakness and confusion.  After arrival in the ED he was found to have a temp of101.9 Fand pulse up to123.WBC 13.2, creatinine 2.74, glucose 589, andlactic acid 5.21.Urinalysis wasw/osigns of infection. Chest x-ray wasw/oinfiltrate or edema. Was being evaluated for ? Syncope and being monitored for SIRS. Complained of Right Hip Pain so obtained Hip X-Ray and Negative. Hospitalization complicated by Uncontrolled Hyperglycemia and suspect non-compliance with Insulin regimen. Insulin adjusted and patient improved. He was deemed medically stable to D/C Home with Home Health and will need to follow up with PCP, Cardiology, GI, and Neurology in the outpatient setting.   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 No active pulmonary disease. -UA not c/w UTI - no clinical evidence of an active infection  -Lactate has normalized - stoppedabx and follow clinically -Afebrile now and WBC is improved -LA is 1.0  -Continue to Monitoras an outpatient   PossibleFall/Syncope -Patient denies, but appears to have little insight into his health -Check Head CT -Checked ECHOCardiogram as below and showed EF of 60-65% and Grade 1 DD; Aortic Valve had possible Bicuspid Morphology and Mild Aortic Regurge and Stenosis. Patient also had a small Pericardial Effusion noted  -PT/OT Recommending Home Health PT -Checked Right Hip X-Ray as patient is complaining of Pain and Negative  -Stable to D/C  Right Hip Pain -Checked Hip X-Ray and Negative  -PT/OT to Evaluate and Treat and recommending Home Health PT and [place] -Will need Neurology Evaluation to evaluate for Sciatica and Lumbar Stenosis   Uncontrolled DM2- DKA -HBA1c 13.0- unclear if he had true DKA at admit, or severely elevated CBG as well as lactic acidosis - -Regardlesshas now improved - titrate insulin tx - appears he will need help w/ insulin dosing as outpt -Suspected patient was Non-Compliant  -Increased Moderate Novolog SSI AC/HS to Resistant Scale while hospitalized and increased Lantus 22 units to 25 units sq qHS -Continue Lantus 25 units sq qHS and 5 Units Novolog TID with Meals -CBG's ranging from 189-252 -Diabetes Education Coordinator Consulted for further evaluation and Managementand will need outpatient Diabetes Education Coordinator as an outpatient   Acute kidney injury onCKD StageIII -Baseline creatinine around 2 -Given IVF Hydration  and Cr improved to Baseline and  is now 1.54 -Continue to Monitor Renal Function Trend andAvoid Nephrotoxic Medications if possible -Repeat CMP in AM  History of DVT/PE onChronicAnticoagulation -ContinueApixaban 5 mg po BID   ChronicDiastolicCongestiveHeartFailure -EF 60-65% with grade 1 dysfunctionvia TTE10/2018 -No significantvolume overload on exam -Strict I's O's and Daily Weights; Patient is +2,734 Liters -Weight is Up 4 Lbs -Resume Home Lasix but at Half dose  -Follow up with Cardiology as an outpatient  Small Pericardial Effusion -Not causing Cardiac Tampanode -Follow up with Cardiology as an outpatient for repeat [facility] and monitoring.   MildCognitiveImpairment -Family report progressively worsening memory/getting lost -Will get Head CT for ? Fall and Syncope along with Cognitive Impairment  -Will need Neurology Follow upand have made Ambulatory Referral with [facility]   Esophageal stricture >Dysphagia -Patientto have repeat esophageal dilationas outpt  Depression andAnxiety -C/w Fluoxetine 20 mg po qHS  BPH -ContinueTamsulosin 0.4 po DailyandFinasteride 5 mg po Daily  ObstructiveSleepApnea -On 2 L oxygen at night+CPAP  Dyslipidemia -ContinueFenofibrate andPravastatin80 mg po Daily  GERD -ContinuePantoprazole 40 mg po BID at home  Discharge Instructions  Discharge Instructions    Ambulatory referral to Neurology   Complete by:  As directed    An appointment is requested in approximately: 1-2 weeks   Ambulatory referral to Nutrition and Diabetic Education   Complete by:  As directed    Call MD for:  difficulty breathing, headache or visual disturbances   Complete by:  As directed    Call MD for:  extreme fatigue   Complete by:  As directed    Call MD for:  hives   Complete by:  As directed    Call MD for:  persistant dizziness or light-headedness   Complete by:  As directed    Call MD for:  persistant nausea and vomiting   Complete by:  As  directed    Call MD for:  redness, tenderness, or signs of infection (pain, swelling, redness, odor or green/yellow discharge around incision site)   Complete by:  As directed    Call MD for:  severe uncontrolled pain   Complete by:  As directed    Call MD for:  temperature >100.4   Complete by:  As directed    Diet - low sodium heart healthy   Complete by:  As directed    Diet Carb Modified   Complete by:  As directed    Discharge instructions   Complete by:  As directed    You were cared for by a hospitalist during your hospital stay. If you have any questions about your discharge medications or the care you received while you were in the hospital after you are discharged, you can call the unit and ask to speak with the hospitalist on call if the hospitalist that took care of you is not available. Once you are discharged, your primary care physician will handle any further medical issues. Please note that NO REFILLS for any discharge medications will be authorized once you are discharged, as it is imperative that you return to your primary care physician (or establish a relationship with a primary care physician if you do not have one) for your aftercare needs so that they can reassess your need for medications and monitor your lab values.  Follow up with PCP and Neurology as an outpatient. Take all medications as prescribed. If symptoms change or worsen please return to the ED for evaluation   Increase activity slowly   Complete  by:  As directed      Allergies as of 12/02/2017   No Known Allergies     Medication List    TAKE these medications   albuterol (2.5 MG/3ML) 0.083% nebulizer solution Commonly known as:  PROVENTIL Take 3 mLs (2.5 mg total) by nebulization every 2 (two) hours as needed for wheezing.   allopurinol 300 MG tablet Commonly known as:  ZYLOPRIM Take 300 mg by mouth daily.   apixaban 5 MG Tabs tablet Commonly known as:  ELIQUIS 10mg  BID till 02/04/17 then 5mg   BID from 02/05/17 onwards What changed:    how much to take  how to take this  when to take this  additional instructions   fenofibrate 145 MG tablet Commonly known as:  TRICOR Take 145 mg by mouth daily.   fexofenadine 180 MG tablet Commonly known as:  ALLEGRA Take 180 mg by mouth daily.   finasteride 5 MG tablet Commonly known as:  PROSCAR Take 5 mg by mouth daily.   FLUoxetine 20 MG capsule Commonly known as:  PROZAC Take 20 mg by mouth daily.   fluticasone 50 MCG/ACT nasal spray Commonly known as:  FLONASE Place 2 sprays into both nostrils daily.   furosemide 40 MG tablet Commonly known as:  LASIX Take 0.5 tablets (20 mg total) by mouth daily. What changed:  how much to take   insulin aspart 100 UNIT/ML injection Commonly known as:  novoLOG Inject 5 Units into the skin 3 (three) times daily with meals. What changed:    how much to take  when to take this   insulin glargine 100 UNIT/ML injection Commonly known as:  LANTUS Inject 0.25 mLs (25 Units total) into the skin at bedtime. What changed:  how much to take   nystatin powder Commonly known as:  MYCOSTATIN/NYSTOP Apply topically 2 (two) times daily.   oxybutynin 10 MG 24 hr tablet Commonly known as:  DITROPAN-XL Take 10 mg by mouth at bedtime.   oxyCODONE 5 MG immediate release tablet Commonly known as:  Oxy IR/ROXICODONE Take 1 tablet (5 mg total) by mouth every 4 (four) hours as needed for severe pain.   pantoprazole 40 MG tablet Commonly known as:  PROTONIX Take 1 tablet (40 mg total) by mouth daily. What changed:  when to take this   pravastatin 80 MG tablet Commonly known as:  PRAVACHOL Take 80 mg by mouth daily.   tamsulosin 0.4 MG Caps capsule Commonly known as:  FLOMAX Take 0.4 mg by mouth daily.      Follow-up Information    Annita Brod, MD. Call.   Specialty:  Internal Medicine Why:  Follow up within 1 week Contact information: [address] [place] [address]  [address] [place] [address] [phone]        [facility], [facility] Follow up.   Specialty:  Home Health Services Why:  Therapists, nurse and nursing assistant will contact you to set up visits. Contact information: [address] [place] [address] [phone]          No Known Allergies  Consultations:  None  Procedures/Studies: Dg Chest 1 View  Result Date: 11/29/2017 CLINICAL DATA:  Patient tripped and fell at car wash. EXAM: CHEST  1 VIEW COMPARISON:  11/08/2017 FINDINGS: AP portable semi upright view of the chest. Borderline cardiomegaly with aortic atherosclerosis. No acute pulmonary consolidation, effusion or pneumothorax. No overt pulmonary edema. No acute osseous abnormality. IMPRESSION: Borderline cardiomegaly with aortic atherosclerosis. No active pulmonary disease. Electronically  Signed   By: Tollie Eth M.D.   On: 11/29/2017 20:07   Dg Chest 2 View  Result Date: 11/08/2017 CLINICAL DATA:  Pt coming from [facility] via [facility]. Pt went to [employer] and then drove back to facility where he was unloading his groceries and got very dizzy. A neighbor saw him stumbling while he was walking inside. EXAM: CHEST - 2 VIEW COMPARISON:  01/26/2017 FINDINGS: Heart size is normal. There is atherosclerotic calcification of the thoracic aorta. Stable elevation of the RIGHT hemidiaphragm. No pulmonary edema. No focal consolidations. Degenerative changes are seen in thoracic spine. IMPRESSION: No evidence for acute cardiopulmonary abnormality. Aortic atherosclerosis. (ICD10-I70.0) Electronically Signed   By: Norva Pavlov M.D.   On: 11/08/2017 20:43   Ct Head Wo Contrast  Result Date: 12/02/2017 CLINICAL DATA:  Syncope. History of hypertension, hypercholesterolemia, diabetes. EXAM: CT HEAD WITHOUT CONTRAST TECHNIQUE: Contiguous axial images were obtained from the base of the skull through the vertex without intravenous contrast.  COMPARISON:  None. FINDINGS: BRAIN: No intraparenchymal hemorrhage, mass effect nor midline shift. The ventricles and sulci are normal for age. Patchy to confluent supratentorial white matter hypodensities. Old small LEFT cerebellar infarct. No acute large vascular territory infarcts. No abnormal extra-axial fluid collections. Basal cisterns are patent. VASCULAR: Mild calcific atherosclerosis of the carotid siphons. SKULL: No skull fracture. No significant scalp soft tissue swelling. Small LEFT frontal subgaleal scalp lipoma. SINUSES/ORBITS: Mild paranasal sinus mucosal thickening. Mastoid air cells are well aerated.The included ocular globes and orbital contents are non-suspicious. OTHER: None. IMPRESSION: 1. No acute intracranial process. 2. Moderate chronic small vessel ischemic changes. Old small LEFT cerebellar infarct. Electronically Signed   By: Awilda Metro M.D.   On: 12/02/2017 03:44   Dg Hip Unilat With Pelvis 2-3 Views Right  Result Date: 12/01/2017 CLINICAL DATA:  Right hip pains since 11/29/2017. No known injury. Initial encounter. EXAM: DG HIP (WITH OR WITHOUT PELVIS) 2-3V RIGHT COMPARISON:  None. FINDINGS: No acute bony or joint abnormality is identified. Joint spaces are preserved. No focal bony lesion. Soft tissues are unremarkable. IMPRESSION: Negative exam. Electronically Signed   By: Drusilla Kanner M.D.   On: 12/01/2017 13:51    ECHOCARDIOGRAM  ------------------------------------------------------------------- Study Conclusions  - Left ventricle: The cavity size was normal. Wall thickness was   increased in a pattern of mild LVH. Systolic function was normal.   The estimated ejection fraction was in the range of 60% to 65%.   Doppler parameters are consistent with abnormal left ventricular   relaxation (grade 1 diastolic dysfunction). - Aortic valve: Poorly visualized. A bicuspid morphology cannot be   excluded; mildly calcified leaflets. There was mild    regurgitation. Probable mild aortic stenosis. Mean gradient (S):   11 mm Hg. Peak gradient (S): 18 mm Hg. VTI ratio of LVOT to   aortic valve: 0.61. - Aorta: Ascending aortic diameter: 42 mm (S). - Ascending aorta: The ascending aorta was mildly dilated. - Mitral valve: Mildly calcified annulus. There was trivial   regurgitation. Valve area by pressure half-time: 2.37 cm^2. - Right atrium: Central venous pressure (est): 3 mm Hg. - Tricuspid valve: There was trivial regurgitation. - Pulmonary arteries: Systolic pressure could not be accurately   estimated. - Pericardium, extracardiac: A small pericardial effusion was   identified anterior to the heart.  Subjective: Seen and Examined at bedside was doing better.  No chest pain, shortness breath, nausea, vomiting.  Still complained of right hip pain but had no other  complaints or concerns.  Blood sugars are better controlled he is happy about that.  No other concerns or complaints at this time go back to independent living facility.  Discharge Exam: Vitals:   12/02/17 0820 12/02/17 1206  BP: 126/79 (!) 151/69  Pulse: 60 84  Resp: 12 20  Temp: 98.5 F (36.9 C) 98.4 F (36.9 C)  SpO2: 98% 100%   Vitals:   12/02/17 0034 12/02/17 0500 12/02/17 0820 12/02/17 1206  BP: (!) 120/56 126/62 126/79 (!) 151/69  Pulse: 74 61 60 84  Resp: 14 16 12 20   Temp: 98 F (36.7 C) 98.6 F (37 C) 98.5 F (36.9 C) 98.4 F (36.9 C)  TempSrc: Axillary Axillary Axillary Axillary  SpO2: 96% 95% 98% 100%  Weight:  102.6 kg    Height:       General: Pt is alert, awake, not in acute distress Cardiovascular: RRR, S1/S2 +, no rubs, no gallops Respiratory: Diminished bilaterally, no wheezing, no rhonchi Abdominal: Soft, NT, Distended 2/2 body habitus, bowel sounds + Extremities: no edema, no cyanosis  The results of significant diagnostics from this hospitalization (including imaging, microbiology, ancillary and laboratory) are listed below for reference.     Microbiology: Recent Results (from the past 240 hour(s))  Blood Culture (routine x 2)     Status: None (Preliminary result)   Collection Time: 11/29/17  7:00 PM  Result Value Ref Range Status   Specimen Description BLOOD RIGHT ARM  Final   Special Requests   Final    BOTTLES DRAWN AEROBIC AND ANAEROBIC Blood Culture adequate volume   Culture   Final    NO GROWTH 3 DAYS Performed at [facility], [address] [place], [place] [address]    Report Status PENDING  Incomplete  Blood Culture (routine x 2)     Status: None (Preliminary result)   Collection Time: 11/29/17  7:35 PM  Result Value Ref Range Status   Specimen Description BLOOD LEFT ANTECUBITAL  Final   Special Requests   Final    BOTTLES DRAWN AEROBIC AND ANAEROBIC Blood Culture results may not be optimal due to an inadequate volume of blood received in culture bottles   Culture   Final    NO GROWTH 3 DAYS Performed at [facility], [address], [place], [place] [address]    Report Status PENDING  Incomplete  Urine Culture     Status: Abnormal   Collection Time: 11/29/17  9:50 PM  Result Value Ref Range Status   Specimen Description URINE, CLEAN CATCH  Final   Special Requests   Final    NONE Performed at [facility], [address], [place], [place] [address]    Culture MULTIPLE SPECIES PRESENT, SUGGEST RECOLLECTION (A)  Final   Report Status 12/01/2017 FINAL  Final  MRSA PCR Screening     Status: None   Collection Time: 11/29/17 11:46 PM  Result Value Ref Range Status   MRSA by PCR NEGATIVE NEGATIVE Final    Comment:        The GeneXpert MRSA Assay (FDA approved for NASAL specimens only), is one component of a comprehensive MRSA colonization surveillance program. It is not intended to diagnose MRSA infection nor to guide or monitor treatment for MRSA infections. Performed at [facility], [address] [place], [place] [address]   Respiratory Panel by PCR     Status: None    Collection Time: 11/30/17 12:05 AM  Result Value  Ref Range Status   Adenovirus NOT DETECTED NOT DETECTED Final   Coronavirus 229E NOT DETECTED NOT DETECTED Final   Coronavirus HKU1 NOT DETECTED NOT DETECTED Final   Coronavirus NL63 NOT DETECTED NOT DETECTED Final   Coronavirus OC43 NOT DETECTED NOT DETECTED Final   Metapneumovirus NOT DETECTED NOT DETECTED Final   Rhinovirus / Enterovirus NOT DETECTED NOT DETECTED Final   Influenza A NOT DETECTED NOT DETECTED Final   Influenza B NOT DETECTED NOT DETECTED Final   Parainfluenza Virus 1 NOT DETECTED NOT DETECTED Final   Parainfluenza Virus 2 NOT DETECTED NOT DETECTED Final   Parainfluenza Virus 3 NOT DETECTED NOT DETECTED Final   Parainfluenza Virus 4 NOT DETECTED NOT DETECTED Final   Respiratory Syncytial Virus NOT DETECTED NOT DETECTED Final   Bordetella pertussis NOT DETECTED NOT DETECTED Final   Chlamydophila pneumoniae NOT DETECTED NOT DETECTED Final   Mycoplasma pneumoniae NOT DETECTED NOT DETECTED Final    Comment: Performed at [facility], [address], [place], [place] [address]    Labs: BNP (last 3 results) Recent Labs    01/26/17 1757  BNP 33.0   Basic Metabolic Panel: Recent Labs  Lab 11/29/17 1929 11/29/17 2221 11/30/17 0220 12/01/17 0453 12/02/17 0458  NA 133* 139 141 138 137  K 3.7 4.0 3.5 4.0 3.6  CL 97* 109 112* 107 107  CO2 19* 21* 24 25 22   GLUCOSE 589* 338* 187* 218* 208*  BUN 25* 22 20 18 17   CREATININE 2.74* 2.37* 2.11* 1.91* 1.54*  CALCIUM 9.7 8.2* 8.5* 8.7* 9.0  MG  --   --  1.8  --  1.7  PHOS  --   --   --   --  2.8   Liver Function Tests: Recent Labs  Lab 11/29/17 1929 12/01/17 0453 12/02/17 0458  AST 38 33 25  ALT 25 22 23   ALKPHOS 97 71 80  BILITOT 1.0 0.9 0.9  PROT 6.5 5.4* 5.8*  ALBUMIN 3.7 2.7* 2.9*   No results for input(s): LIPASE, AMYLASE in the last 168 hours. No results for input(s): AMMONIA in the last 168 hours. CBC: Recent Labs  Lab 11/29/17 1929  11/30/17 0220 12/01/17 0453 12/02/17 0458  WBC 13.2* 11.9* 9.3 7.6  NEUTROABS 11.2*  --   --  4.9  HGB 16.8 14.3 14.2 14.8  HCT 48.6 42.4 43.1 44.4  MCV 95.7 96.6 97.7 96.9  PLT 276 225 201 202   Cardiac Enzymes: No results for input(s): CKTOTAL, CKMB, CKMBINDEX, TROPONINI in the last 168 hours. BNP: Invalid input(s): POCBNP CBG: Recent Labs  Lab 12/01/17 1119 12/01/17 1658 12/01/17 2034 12/02/17 0819 12/02/17 1206  GLUCAP 252* 205* 198* 189* 211*   D-Dimer No results for input(s): DDIMER in the last 72 hours. Hgb A1c Recent Labs    11/30/17 0220  HGBA1C 13.0*   Lipid Profile No results for input(s): CHOL, HDL, LDLCALC, TRIG, CHOLHDL, LDLDIRECT in the last 72 hours. Thyroid function studies No results for input(s): TSH, T4TOTAL, T3FREE, THYROIDAB in the last 72 hours.  Invalid input(s): FREET3 Anemia work up No results for input(s): VITAMINB12, FOLATE, FERRITIN, TIBC, IRON, RETICCTPCT in the last 72 hours. Urinalysis    Component Value Date/Time   COLORURINE YELLOW 11/29/2017 1929   APPEARANCEUR HAZY (A) 11/29/2017 1929   LABSPEC 1.012 11/29/2017 1929   PHURINE 6.0 11/29/2017 1929   GLUCOSEU >=500 (A) 11/29/2017 1929   HGBUR SMALL (A) 11/29/2017 1929   BILIRUBINUR NEGATIVE 11/29/2017 1929   KETONESUR  NEGATIVE 11/29/2017 1929   PROTEINUR 100 (A) 11/29/2017 1929   NITRITE NEGATIVE 11/29/2017 1929   LEUKOCYTESUR TRACE (A) 11/29/2017 1929   Sepsis Labs Invalid input(s): PROCALCITONIN,  WBC,  LACTICIDVEN Microbiology Recent Results (from the past 240 hour(s))  Blood Culture (routine x 2)     Status: None (Preliminary result)   Collection Time: 11/29/17  7:00 PM  Result Value Ref Range Status   Specimen Description BLOOD RIGHT ARM  Final   Special Requests   Final    BOTTLES DRAWN AEROBIC AND ANAEROBIC Blood Culture adequate volume   Culture   Final    NO GROWTH 3 DAYS Performed at [facility], [address], [place], [place] [address]    Report  Status PENDING  Incomplete  Blood Culture (routine x 2)     Status: None (Preliminary result)   Collection Time: 11/29/17  7:35 PM  Result Value Ref Range Status   Specimen Description BLOOD LEFT ANTECUBITAL  Final   Special Requests   Final    BOTTLES DRAWN AEROBIC AND ANAEROBIC Blood Culture results may not be optimal due to an inadequate volume of blood received in culture bottles   Culture   Final    NO GROWTH 3 DAYS Performed at [facility], [address] [place], [place] [address]    Report Status PENDING  Incomplete  Urine Culture     Status: Abnormal   Collection Time: 11/29/17  9:50 PM  Result Value Ref Range Status   Specimen Description URINE, CLEAN CATCH  Final   Special Requests   Final    NONE Performed at [facility], [address] [place], [place] [address]    Culture MULTIPLE SPECIES PRESENT, SUGGEST RECOLLECTION (A)  Final   Report Status 12/01/2017 FINAL  Final  MRSA PCR Screening     Status: None   Collection Time: 11/29/17 11:46 PM  Result Value Ref Range Status   MRSA by PCR NEGATIVE NEGATIVE Final    Comment:        The GeneXpert MRSA Assay (FDA approved for NASAL specimens only), is one component of a comprehensive MRSA colonization surveillance program. It is not intended to diagnose MRSA infection nor to guide or monitor treatment for MRSA infections. Performed at [facility], [address], [place], [place] [address]   Respiratory Panel by PCR     Status: None   Collection Time: 11/30/17 12:05 AM  Result Value Ref Range Status   Adenovirus NOT DETECTED NOT DETECTED Final   Coronavirus 229E NOT DETECTED NOT DETECTED Final   Coronavirus HKU1 NOT DETECTED NOT DETECTED Final   Coronavirus NL63 NOT DETECTED NOT DETECTED Final   Coronavirus OC43 NOT DETECTED NOT DETECTED Final   Metapneumovirus NOT DETECTED NOT DETECTED Final   Rhinovirus / Enterovirus NOT DETECTED NOT DETECTED Final   Influenza A NOT DETECTED NOT DETECTED  Final   Influenza B NOT DETECTED NOT DETECTED Final   Parainfluenza Virus 1 NOT DETECTED NOT DETECTED Final   Parainfluenza Virus 2 NOT DETECTED NOT DETECTED Final   Parainfluenza Virus 3 NOT DETECTED NOT DETECTED Final   Parainfluenza Virus 4 NOT DETECTED NOT DETECTED Final   Respiratory Syncytial Virus NOT DETECTED NOT DETECTED Final   Bordetella pertussis NOT DETECTED NOT DETECTED Final   Chlamydophila pneumoniae NOT DETECTED NOT DETECTED Final   Mycoplasma pneumoniae NOT DETECTED NOT DETECTED Final    Comment: Performed at [facility], [address] [place], [place] [address]   Time coordinating discharge: 35 minutes  SIGNED:  Merlene Laughter, DO [facility] 12/02/2017, 5:04 PM Pager is on [email or web address]  If 7PM-7AM, please contact night-coverage [email or web address] Password [email or web address]

## 2017-12-02 NOTE — Progress Notes (Signed)
Inpatient Diabetes Program Recommendations  AACE/ADA: New Consensus Statement on Inpatient Glycemic Control (2015)  Target Ranges:  Prepandial:   less than 140 mg/dL      Peak postprandial:   less than 180 mg/dL (1-2 hours)      Critically ill patients:  140 - 180 mg/dL   Lab Results  Component Value Date   GLUCAP 211 (H) 12/02/2017   HGBA1C 13.0 (H) 11/30/2017    Review of Glycemic Control  Diabetes history: DM2 Outpatient Diabetes medications: Lantus 75 units QHS, Novolog 20 units tidwc  Current orders for Inpatient glycemic control: Lantus 22 units QHS, Novolog 0-20 units tidwc and hs HgbA1C - 13.0% - uncontrolled  ? Whether pt is taking his insulin at ALF.   Inpatient Diabetes Program Recommendations:     For Home:  Lantus 25 units QHS Novolog 5 units tidwc for meal coverage insulin  Unsuccessful attempts to speak with pt today. Will call daughter to inquire about pt giving himself insulin.   Thank you. Ailene Ards, RD, LDN, CDE Inpatient Diabetes Coordinator [phone]

## 2017-12-02 NOTE — Care Management Note (Signed)
Case Management Note  Patient Details  Name: Joshua Dalton MRN: [id number] Date of Birth: 26-Jun-1940  Subjective/Objective:          Pt from [facility] for weakness/fall.  Pt receives PT/OT from [employer] therapy services at [facility].  Pt has all necessary DME including RW, rollator, 3n1.         Action/Plan: Frances Furbish is the preferred [facility] provider at [facility].  Because [facility] doesn't provide nursing services, pt will have to change from [facility] to [facility].  Pt desires nursing services and is willing to change.    Kandee Keen, liaison for [employer], notified and accepted referral.  Information place on AVS.  Pt and daughter verbalize understanding.   Expected Discharge Date:  12/02/17               Expected Discharge Plan:  Home w Home Health Services  In-House Referral:  NA  Discharge planning Services  CM Consult  Post Acute Care Choice:  Home Health Choice offered to:  Patient  DME Arranged:  N/A DME Agency:  NA  HH Arranged:  PT, OT, Nurse's Aide, RN HH Agency:  [facility]  Status of Service:  Completed, signed off  If discussed at Long Length of Stay Meetings, dates discussed:    Additional Comments:  Deveron Furlong, RN 12/02/2017, 4:26 PM

## 2017-12-04 LAB — CULTURE, BLOOD (ROUTINE X 2)
CULTURE: NO GROWTH
CULTURE: NO GROWTH
SPECIAL REQUESTS: ADEQUATE

## 2017-12-04 LAB — LEGIONELLA PNEUMOPHILA SEROGP 1 UR AG: L. pneumophila Serogp 1 Ur Ag: NEGATIVE

## 2018-02-04 ENCOUNTER — Ambulatory Visit: Payer: Medicare Other | Admitting: Neurology

## 2018-06-08 ENCOUNTER — Other Ambulatory Visit: Payer: Self-pay | Admitting: Cardiology

## 2018-06-15 ENCOUNTER — Other Ambulatory Visit: Payer: Self-pay | Admitting: Cardiology

## 2018-08-02 DEATH — deceased

## 2019-04-17 IMAGING — RF DG ESOPHAGUS
7 series · 12 of 15 positions shown · non-contrast
Comparison: None.

CLINICAL DATA: Difficulty swallowing.

EXAM:
ESOPHOGRAM / BARIUM SWALLOW / BARIUM TABLET STUDY
TECHNIQUE: Single contrast examination performed using thick barium liquid and
thin barium liquid. The patient was observed with fluoroscopy
swallowing a 13 mm barium sulphate tablet.
FLUOROSCOPY TIME:  Fluoroscopy Time:  3 minutes 18 seconds
Radiation Exposure Index (if provided by the fluoroscopic device):
65.2
Number of Acquired Spot Images: 7

[Series 1: fluoro_barium 2fps_bw · 0.17mm/px · 1 of 1 slices shown (1 of 6)]
[im 1/1]
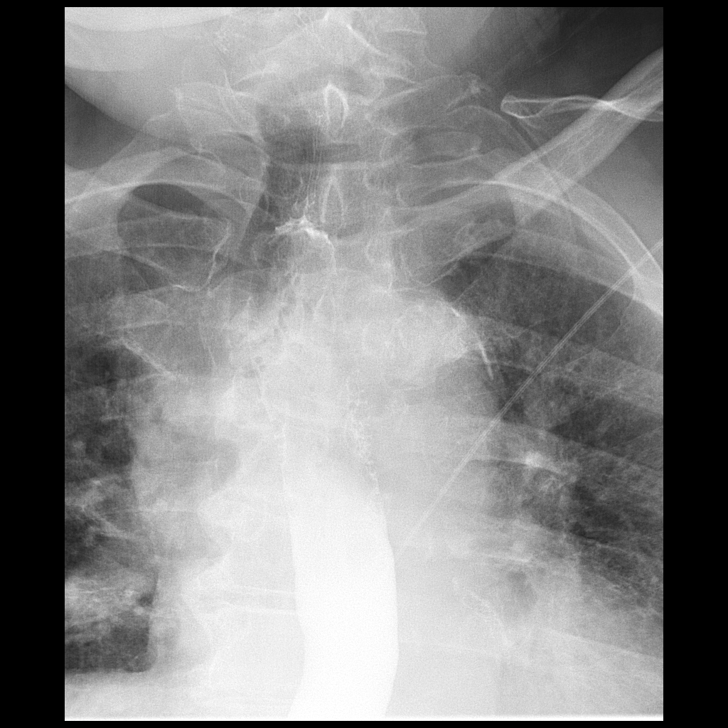

[Series 2: fluoro_barium 2fps_bw · 0.18mm/px · 1 of 1 slices shown (2 of 6)]
[im 1/1]
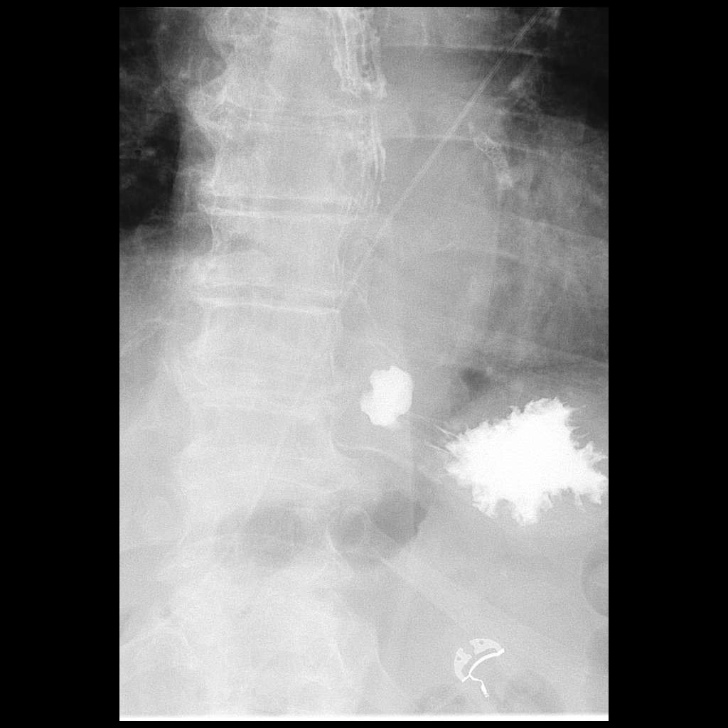

[Series 3: cp_standard · 0.35mm/px · 3 of 29 frames shown]
[frame 5/29]
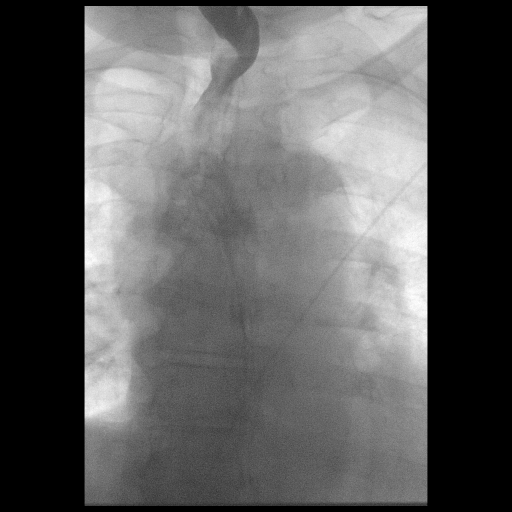
[frame 15/29]
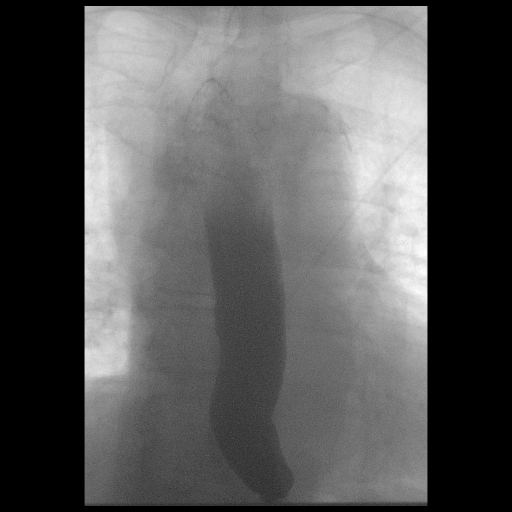
[frame 25/29]
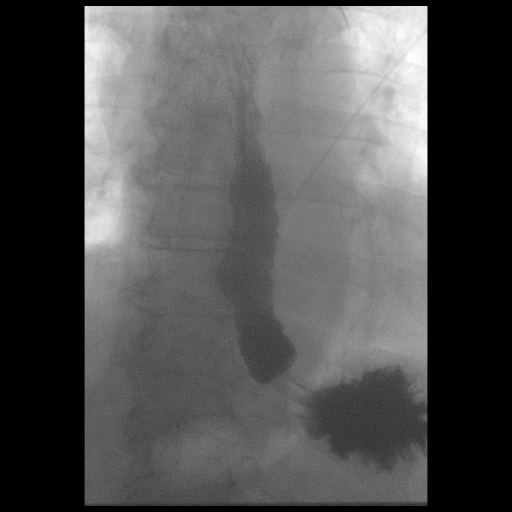

[Series 4: fluoro_barium 2fps_bw · 0.18mm/px · 1 of 2 frames shown (3 of 6)]
[frame 1/2]
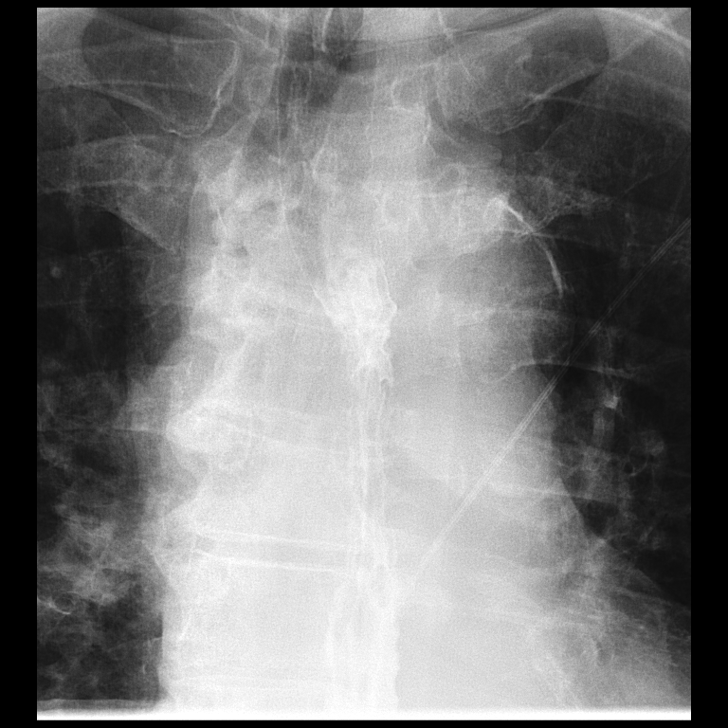

[Series 5: fluoro_barium 2fps_bw · 0.18mm/px · 3 of 3 frames shown (4 of 6)]
[frame 1/3]
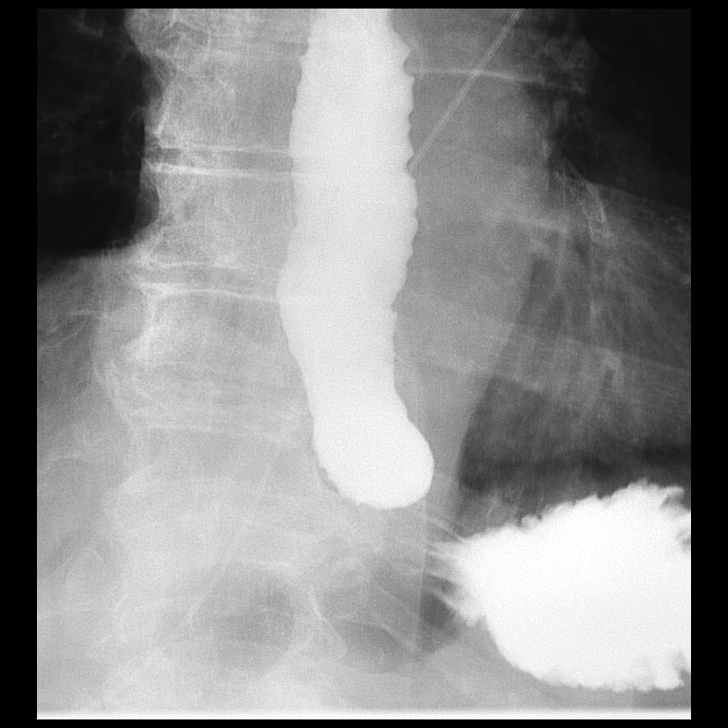
[frame 2/3]
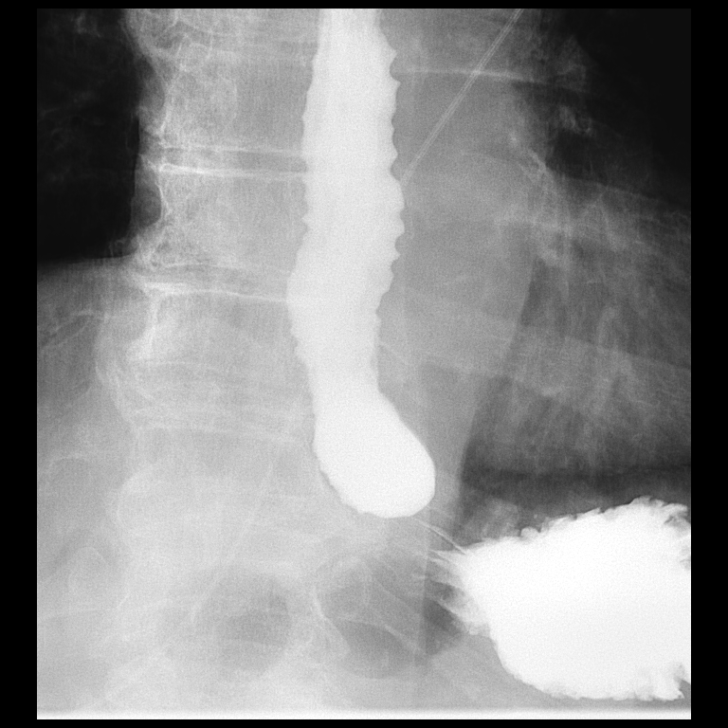
[frame 3/3]
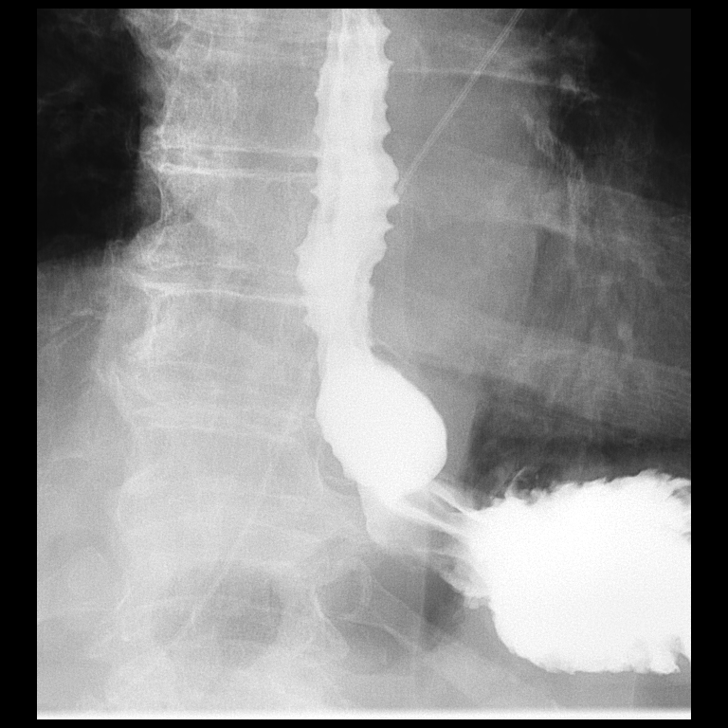

[Series 6: fluoro_barium 2fps_bw · 0.18mm/px · 1 of 2 frames shown (5 of 6)]
[frame 1/2]
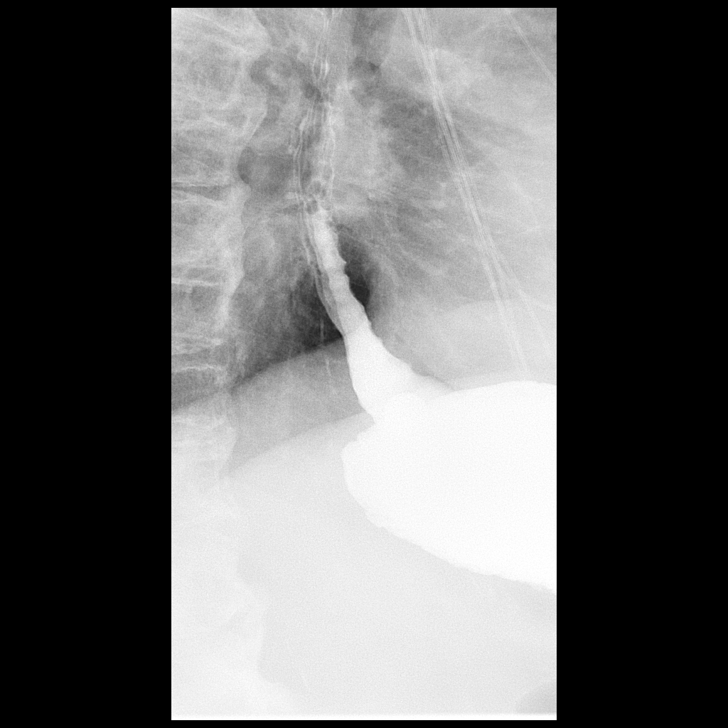

[Series 7: fluoro_barium 2fps_bw · 0.18mm/px · 2 of 2 frames shown (6 of 6)]
[frame 1/2]
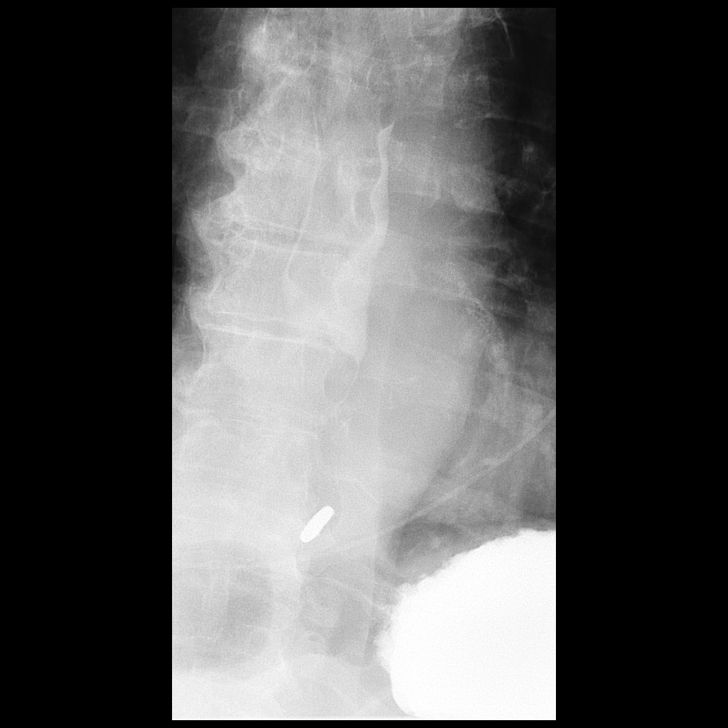
[frame 2/2]
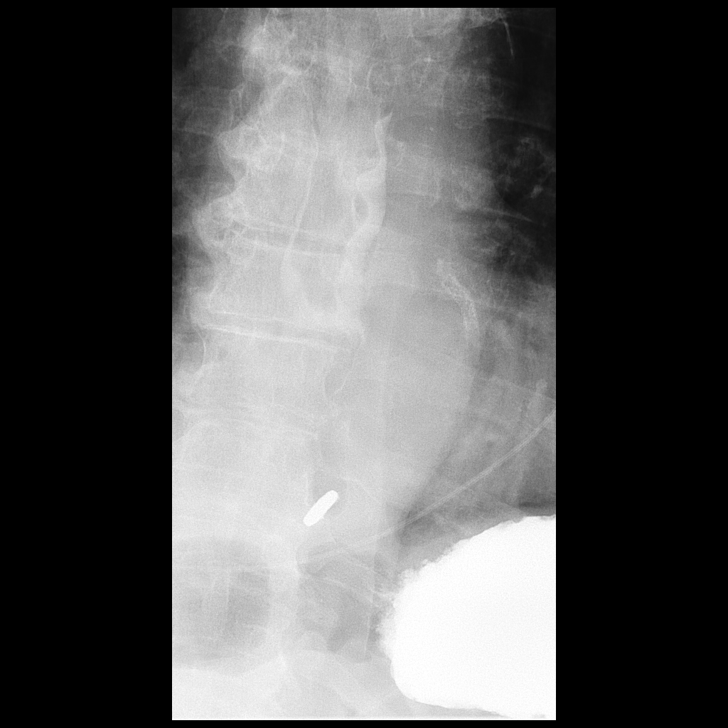

[12 of 15 positions shown; findings below may reference images not displayed]

FINDINGS: Limited exam due to patient immobility.

The esophagus is patulous. There is no stricture or mass within the
mid esophagus or distal esophagus. No mucosal irregularity
identified.

13 mm barium tablet failed the passed the GE junction. Attempts to
advanced tablet with water and thin barium as well as LEFT -sided
down patient positioning. Patient could not be placed up RIGHT due
to ankle fracture.
IMPRESSION: 1. A 13 mm barium fail to pass the GE junction. This may be in part
due to limited patient positioning (patient could not be imaged
upright); however cannot exclude a distal esophageal stricture.
Consider endoscopy for further evaluation.
2. Patulous esophagus without mucosal lesion.
